# Patient Record
Sex: Female | Born: 1970 | Race: White | Hispanic: No | Marital: Married | Smoking: Former smoker
Health system: Southern US, Community
[De-identification: ages and names within clinical notes are randomized; demographics above are authoritative.]

## PROBLEM LIST (undated history)

## (undated) DIAGNOSIS — F988 Other specified behavioral and emotional disorders with onset usually occurring in childhood and adolescence: Secondary | ICD-10-CM

## (undated) DIAGNOSIS — S129XXA Fracture of neck, unspecified, initial encounter: Secondary | ICD-10-CM

## (undated) DIAGNOSIS — M069 Rheumatoid arthritis, unspecified: Secondary | ICD-10-CM

## (undated) DIAGNOSIS — R51 Headache: Secondary | ICD-10-CM

## (undated) DIAGNOSIS — Z87442 Personal history of urinary calculi: Secondary | ICD-10-CM

## (undated) DIAGNOSIS — G4730 Sleep apnea, unspecified: Secondary | ICD-10-CM

## (undated) DIAGNOSIS — I10 Essential (primary) hypertension: Secondary | ICD-10-CM

## (undated) DIAGNOSIS — F32A Depression, unspecified: Secondary | ICD-10-CM

## (undated) DIAGNOSIS — Z9889 Other specified postprocedural states: Secondary | ICD-10-CM

## (undated) DIAGNOSIS — B009 Herpesviral infection, unspecified: Secondary | ICD-10-CM

## (undated) DIAGNOSIS — F329 Major depressive disorder, single episode, unspecified: Secondary | ICD-10-CM

## (undated) DIAGNOSIS — K5909 Other constipation: Secondary | ICD-10-CM

## (undated) DIAGNOSIS — K219 Gastro-esophageal reflux disease without esophagitis: Secondary | ICD-10-CM

## (undated) DIAGNOSIS — R519 Headache, unspecified: Secondary | ICD-10-CM

## (undated) DIAGNOSIS — M549 Dorsalgia, unspecified: Secondary | ICD-10-CM

## (undated) DIAGNOSIS — T4145XA Adverse effect of unspecified anesthetic, initial encounter: Secondary | ICD-10-CM

## (undated) DIAGNOSIS — T8859XA Other complications of anesthesia, initial encounter: Secondary | ICD-10-CM

## (undated) DIAGNOSIS — E0590 Thyrotoxicosis, unspecified without thyrotoxic crisis or storm: Secondary | ICD-10-CM

## (undated) DIAGNOSIS — F419 Anxiety disorder, unspecified: Secondary | ICD-10-CM

## (undated) DIAGNOSIS — N63 Unspecified lump in unspecified breast: Secondary | ICD-10-CM

## (undated) DIAGNOSIS — G8929 Other chronic pain: Secondary | ICD-10-CM

## (undated) DIAGNOSIS — R112 Nausea with vomiting, unspecified: Secondary | ICD-10-CM

## (undated) DIAGNOSIS — G709 Myoneural disorder, unspecified: Secondary | ICD-10-CM

## (undated) DIAGNOSIS — I456 Pre-excitation syndrome: Secondary | ICD-10-CM

## (undated) DIAGNOSIS — E039 Hypothyroidism, unspecified: Secondary | ICD-10-CM

## (undated) DIAGNOSIS — M797 Fibromyalgia: Secondary | ICD-10-CM

## (undated) HISTORY — DX: Pre-excitation syndrome: I45.6

## (undated) HISTORY — PX: SIGMOIDOSCOPY: SUR1295

## (undated) HISTORY — PX: THYROIDECTOMY: SHX17

## (undated) HISTORY — PX: TONSILLECTOMY: SUR1361

## (undated) HISTORY — PX: LUMBAR SPINE SURGERY: SHX701

## (undated) HISTORY — DX: Anxiety disorder, unspecified: F41.9

## (undated) HISTORY — DX: Depression, unspecified: F32.A

## (undated) HISTORY — PX: BUNIONECTOMY: SHX129

## (undated) HISTORY — PX: WISDOM TOOTH EXTRACTION: SHX21

## (undated) HISTORY — DX: Rheumatoid arthritis, unspecified: M06.9

## (undated) HISTORY — DX: Major depressive disorder, single episode, unspecified: F32.9

## (undated) HISTORY — PX: CARDIAC ELECTROPHYSIOLOGY MAPPING AND ABLATION: SHX1292

## (undated) HISTORY — DX: Dorsalgia, unspecified: M54.9

## (undated) HISTORY — PX: BACK SURGERY: SHX140

## (undated) HISTORY — DX: Other chronic pain: G89.29

## (undated) HISTORY — PX: ABDOMINAL HYSTERECTOMY: SHX81

## (undated) HISTORY — PX: CERVICAL SPINE SURGERY: SHX589

---

## 1992-06-29 DIAGNOSIS — I456 Pre-excitation syndrome: Secondary | ICD-10-CM

## 1992-06-29 HISTORY — DX: Pre-excitation syndrome: I45.6

## 1994-06-29 DIAGNOSIS — Z9889 Other specified postprocedural states: Secondary | ICD-10-CM

## 1994-06-29 HISTORY — DX: Other specified postprocedural states: Z98.890

## 1994-06-29 HISTORY — PX: THYROIDECTOMY: SHX17

## 2000-08-02 ENCOUNTER — Emergency Department (HOSPITAL_COMMUNITY): Admission: EM | Admit: 2000-08-02 | Discharge: 2000-08-02 | Payer: Self-pay | Admitting: Emergency Medicine

## 2000-08-24 ENCOUNTER — Other Ambulatory Visit: Admission: RE | Admit: 2000-08-24 | Discharge: 2000-08-24 | Payer: Self-pay | Admitting: Gynecology

## 2001-08-31 ENCOUNTER — Other Ambulatory Visit: Admission: RE | Admit: 2001-08-31 | Discharge: 2001-08-31 | Payer: Self-pay | Admitting: Gynecology

## 2003-04-23 ENCOUNTER — Other Ambulatory Visit: Admission: RE | Admit: 2003-04-23 | Discharge: 2003-04-23 | Payer: Self-pay | Admitting: Gynecology

## 2004-07-21 ENCOUNTER — Other Ambulatory Visit: Admission: RE | Admit: 2004-07-21 | Discharge: 2004-07-21 | Payer: Self-pay | Admitting: Gynecology

## 2004-12-18 ENCOUNTER — Ambulatory Visit: Payer: Self-pay | Admitting: Cardiology

## 2005-02-05 ENCOUNTER — Encounter: Admission: RE | Admit: 2005-02-05 | Discharge: 2005-05-06 | Payer: Self-pay | Admitting: Family Medicine

## 2005-05-19 ENCOUNTER — Encounter: Admission: RE | Admit: 2005-05-19 | Discharge: 2005-06-17 | Payer: Self-pay | Admitting: Family Medicine

## 2005-06-26 ENCOUNTER — Encounter: Admission: RE | Admit: 2005-06-26 | Discharge: 2005-06-26 | Payer: Self-pay | Admitting: Neurological Surgery

## 2005-06-29 ENCOUNTER — Emergency Department (HOSPITAL_COMMUNITY): Admission: EM | Admit: 2005-06-29 | Discharge: 2005-06-29 | Payer: Self-pay | Admitting: Emergency Medicine

## 2005-06-30 ENCOUNTER — Encounter: Admission: RE | Admit: 2005-06-30 | Discharge: 2005-06-30 | Payer: Self-pay | Admitting: Neurological Surgery

## 2005-07-02 ENCOUNTER — Ambulatory Visit (HOSPITAL_COMMUNITY): Admission: RE | Admit: 2005-07-02 | Discharge: 2005-07-03 | Payer: Self-pay | Admitting: Neurological Surgery

## 2005-07-09 ENCOUNTER — Ambulatory Visit (HOSPITAL_COMMUNITY): Admission: RE | Admit: 2005-07-09 | Discharge: 2005-07-09 | Payer: Self-pay | Admitting: Unknown Physician Specialty

## 2005-10-23 ENCOUNTER — Ambulatory Visit: Payer: Self-pay | Admitting: Cardiology

## 2005-12-04 ENCOUNTER — Encounter: Admission: RE | Admit: 2005-12-04 | Discharge: 2006-01-15 | Payer: Self-pay | Admitting: Family Medicine

## 2006-02-03 ENCOUNTER — Ambulatory Visit (HOSPITAL_COMMUNITY): Admission: RE | Admit: 2006-02-03 | Discharge: 2006-02-04 | Payer: Self-pay | Admitting: Neurological Surgery

## 2006-03-09 ENCOUNTER — Encounter: Admission: RE | Admit: 2006-03-09 | Discharge: 2006-03-09 | Payer: Self-pay | Admitting: Neurological Surgery

## 2006-05-17 ENCOUNTER — Encounter: Admission: RE | Admit: 2006-05-17 | Discharge: 2006-05-17 | Payer: Self-pay | Admitting: Neurological Surgery

## 2006-09-13 ENCOUNTER — Other Ambulatory Visit: Admission: RE | Admit: 2006-09-13 | Discharge: 2006-09-13 | Payer: Self-pay | Admitting: Gynecology

## 2009-12-26 ENCOUNTER — Encounter: Admission: RE | Admit: 2009-12-26 | Discharge: 2009-12-26 | Payer: Self-pay | Admitting: Rheumatology

## 2010-08-07 ENCOUNTER — Emergency Department (HOSPITAL_COMMUNITY)
Admission: EM | Admit: 2010-08-07 | Discharge: 2010-08-07 | Disposition: A | Discharge status: Home / Self Care | Payer: Managed Care, Other (non HMO) | Attending: Emergency Medicine | Admitting: Emergency Medicine

## 2010-08-07 DIAGNOSIS — X58XXXA Exposure to other specified factors, initial encounter: Secondary | ICD-10-CM | POA: Insufficient documentation

## 2010-08-07 DIAGNOSIS — R220 Localized swelling, mass and lump, head: Secondary | ICD-10-CM | POA: Insufficient documentation

## 2010-08-07 DIAGNOSIS — J3489 Other specified disorders of nose and nasal sinuses: Secondary | ICD-10-CM | POA: Insufficient documentation

## 2010-08-07 DIAGNOSIS — R221 Localized swelling, mass and lump, neck: Secondary | ICD-10-CM | POA: Insufficient documentation

## 2010-08-07 DIAGNOSIS — T7840XA Allergy, unspecified, initial encounter: Secondary | ICD-10-CM | POA: Insufficient documentation

## 2010-11-04 ENCOUNTER — Other Ambulatory Visit: Payer: Self-pay | Admitting: Anesthesiology

## 2010-11-04 DIAGNOSIS — M5137 Other intervertebral disc degeneration, lumbosacral region: Secondary | ICD-10-CM

## 2010-11-04 DIAGNOSIS — M545 Low back pain, unspecified: Secondary | ICD-10-CM

## 2010-11-04 DIAGNOSIS — R209 Unspecified disturbances of skin sensation: Secondary | ICD-10-CM

## 2010-11-06 ENCOUNTER — Other Ambulatory Visit: Payer: Managed Care, Other (non HMO)

## 2010-11-08 ENCOUNTER — Other Ambulatory Visit: Payer: Managed Care, Other (non HMO)

## 2010-11-14 NOTE — Op Note (Signed)
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   PROCEDURE:  1. Decompressive anterior cervical diskectomy C5-6.  2. Anterior cervical arthrodesis C5-C6 utilizing a 7-mm allograft.  3. Anterior cervical plating C5-6 utilizing a 25-mm Venture plate.   SURGEON:  Dr. Marikay Alar.   ASSISTANT:  Dr. Donalee Citrin.   ANESTHESIA:  General endotracheal.   COMPLICATIONS:  None apparent.   INDICATIONS FOR PROCEDURE:  [name] is a 40 year old white female who  presented with neck and left arm pain in a C6 distribution.  She had MRI  which showed cervical disk herniation at C5-6 paracentral to the left.  She  tried medical management for quite some time without significant relief.  We  talked about treatment options.  She opted for an anterior cervical  diskectomy and fusion with plating.  She understood the risks, benefits,  expected outcome and wished to proceed.   DESCRIPTION OF PROCEDURE:  The patient was taken to operating room.  After  induction of adequate general endotracheal anesthesia,  she was placed in  the supine position on the operating room table.  Her right anterior  cervical region was prepped with DuraPrep and then draped in the usual  sterile fashion.  Three mL of local anesthesia was injected and a small  incision was made to the right of midline and carried down to the platysma  which was elevated, opened and undermined with Metzenbaum scissors.  I then  dissected a plane medial to the  sternocleidomastoid muscle and internal  carotid artery and lateral to the trachea and esophagus to expose C5-6.  Intraoperative fluoroscopy confirmed my level, and then the longus colli  muscles were taken down bilaterally and the Shadow-Line retractors were  placed under this to expose C5-6.  The annulus was incised and the initial  diskectomy was done with pituitary rongeurs and curved curettes.  I then  used the high-speed drill to drill the endplates down to the level of the  posterior longitudinal ligament.  The posterior longitudinal ligament was  opened with a nerve hook using microscopic dissection and then removed in a  circumferential fashion utilizing the 2-mm Kerrison punch.  We dissected on  the left out to the pedicle, found the pedicle and followed this superiorly  to identify the left C6 nerve root and followed it out into the foramen  until it was adequately decompressed.  We palpated with a nerve hook to  assure adequate decompression.  We undercut the body of C5 and C6 to  decompress the central canal and perform a right-sided foraminotomy also.  We then palpated to assure adequate decompression.  The dura was full and  capacious.  We felt we had a good decompression of left C6 nerve root which  was the goal of surgery.  He irrigated with saline solution containing  bacitracin, dried all bleeding points with Gelfoam and with bipolar cautery,  measured the interspace to be 7 mm and placed a 7-mm allograft into the  interspace at C5-6.  We then used a 25-mm Venture plate and placed two 13-mm  variable-angle screws in the bodies of C5 and C6 and locked these into  position by locking the locking mechanism on the plate.  We then irrigated  with saline solution containing bacitracin, dried all bleeding points with  Surgifoam and with bipolar cautery, inspected our construct once again  utilizing fluoroscopy, and once meticulous hemostasis was achieved, closed  the platysma  with 3-0 Vicryl.  We closed the subcuticular tissue with 3-0  Vicryl and closed the skin with Benzoin and Steri-Strips.  The drapes were  removed.  Sterile dressing was applied.  The patient was awakened from  general anesthesia and transported to the recovery room in stable condition.  At the end of the procedure all sponge, needle and instrument counts were  correct.      Tia Alert, MD  Electronically Signed     DSJ/MEDQ  D:  02/03/2006  T:  02/03/2006  Job:  [id number]

## 2010-11-14 NOTE — Op Note (Signed)
NAME:  [name], [name]               ACCOUNT NO.:  [id number]   MEDICAL RECORD NO.:  [id number]          PATIENT TYPE:  OIB   LOCATION:  3040                         FACILITY:  [facility]   PHYSICIAN:  Tia Alert, MD     DATE OF BIRTH:  [birth year]   DATE OF PROCEDURE:  07/02/2005  DATE OF DISCHARGE:                                 OPERATIVE REPORT   PREOPERATIVE DIAGNOSIS:  Right L4-5 disk herniation with right L5  radiculopathy.   POSTOPERATIVE DIAGNOSIS:  Right L4-5 disk herniation with right L5  radiculopathy.   PROCEDURES:  1.  Decompressive lumbar hemilaminectomy.  2.  Medial facetectomy.  3.  Foraminotomy, L4-5 on the right -- followed by microdiskectomy at L4-5      on the right utilizing microscopic dissection.   SURGEON:  Tia Alert, MD.   ASSISTANT:  Reinaldo Meeker, M.D.   ANESTHESIA:  General endotracheal.   COMPLICATIONS:  None apparent.   INDICATIONS FOR PROCEDURE:  Ms. [name] is a 40 year old white female who is  referred with back and right leg pain.  She had an MRI which showed only  small disk bulges at L4-5 and L5-S1.  I got a CT myelogram which showed a  much larger disk herniation at L4-5, and she had worse symptoms since the  time the MRI was done.  Her pain was consistent with a right L5  radiculopathy. I recommended a lumbar hemilaminectomy and microdiskectomy at  L4-5 on the right side. She understood the risks, benefits and expected  outcome and wished to proceed.   DESCRIPTION OF PROCEDURE:  The patient was taken to the operating room, and  after induction of adequate generalized endotracheal anesthesia she was  rolled onto the prone position on the Wilson frame. All pressure points were  padded. Her lumbar region was prepped with DuraPrep and then draped in the  usual sterile fashion. Then 5 cc of local anesthesia was injected and a  small dorsal midline incision was made and carried down to the lumbosacral  fascia. The fascia was opened at  the paraspinous musculature; was taken down  in a subperiosteal fashion to expose the L4-5 on the right side.  Intraoperative x-ray confirmed my level, and then a hemilaminectomy, medial  facetectomy and a foraminotomy were performed at L4-5 on the right side,  utilizing a combination of the high-speed drill and the Kerrison punch. The  yellow ligament was identified, opened and removed in piecemeal fashion to  expose the underlying dura and L5 nerve root.  I then retracted the nerve  root medially and coagulated the epidural venous vasculature; brought in the  operating microscope and utilizing microscopic dissection, cut the annulus  at the disk and performed a thorough intradiskal diskectomy.  A large  subannular disk herniation was noted, but no free fragments were noted.  I  continued the diskectomy utilizing pituitary rongeurs and curets. Once the  diskectomy was complete, we palpated with a nerve hook into the midline,  distally into the foramen and superiorly to assure adequate decompression.  We found no more significant compression of  the L5 nerve root. Therefore,  the wound was irrigated with copious amounts of bacitracin-containing saline  solution.  All bleeding points were coagulated and the fascia was closed  with interrupted #1 Vicryl. The subcutaneous and subcuticular tissues were  closed with 2-0 and 3-0 Vicryl, and the skin was closed with Benzoin and  Steri-Strips. The drapes were removed. A sterile dressing was applied. The  patient was awakened from general anesthesia and transferred to the recovery  room in stable condition. At the end of the procedure all sponge, needle and  instrument counts were correct.      Tia Alert, MD  Electronically Signed     DSJ/MEDQ  D:  07/02/2005  T:  07/03/2005  Job:  [id number]

## 2010-11-17 ENCOUNTER — Other Ambulatory Visit: Payer: Self-pay | Admitting: Neurological Surgery

## 2010-11-17 ENCOUNTER — Ambulatory Visit
Admission: RE | Admit: 2010-11-17 | Discharge: 2010-11-17 | Disposition: A | Discharge status: Home / Self Care | Payer: Managed Care, Other (non HMO) | Source: Ambulatory Visit | Attending: Neurological Surgery | Admitting: Neurological Surgery

## 2010-11-17 DIAGNOSIS — M542 Cervicalgia: Secondary | ICD-10-CM

## 2010-11-20 ENCOUNTER — Other Ambulatory Visit: Payer: Self-pay | Admitting: Neurological Surgery

## 2010-11-20 DIAGNOSIS — M542 Cervicalgia: Secondary | ICD-10-CM

## 2010-11-22 ENCOUNTER — Ambulatory Visit
Admission: RE | Admit: 2010-11-22 | Discharge: 2010-11-22 | Disposition: A | Discharge status: Home / Self Care | Payer: Managed Care, Other (non HMO) | Source: Ambulatory Visit | Attending: Neurological Surgery | Admitting: Neurological Surgery

## 2010-11-22 DIAGNOSIS — M542 Cervicalgia: Secondary | ICD-10-CM

## 2010-12-03 ENCOUNTER — Other Ambulatory Visit: Payer: No Typology Code available for payment source

## 2010-12-04 ENCOUNTER — Other Ambulatory Visit: Payer: No Typology Code available for payment source

## 2010-12-06 ENCOUNTER — Other Ambulatory Visit: Payer: No Typology Code available for payment source

## 2010-12-09 ENCOUNTER — Ambulatory Visit
Admission: RE | Admit: 2010-12-09 | Discharge: 2010-12-09 | Disposition: A | Discharge status: Home / Self Care | Payer: Managed Care, Other (non HMO) | Source: Ambulatory Visit | Attending: Anesthesiology | Admitting: Anesthesiology

## 2010-12-09 DIAGNOSIS — M545 Low back pain, unspecified: Secondary | ICD-10-CM

## 2010-12-09 DIAGNOSIS — R209 Unspecified disturbances of skin sensation: Secondary | ICD-10-CM

## 2010-12-09 DIAGNOSIS — M5137 Other intervertebral disc degeneration, lumbosacral region: Secondary | ICD-10-CM

## 2010-12-09 DIAGNOSIS — M51379 Other intervertebral disc degeneration, lumbosacral region without mention of lumbar back pain or lower extremity pain: Secondary | ICD-10-CM

## 2011-02-20 ENCOUNTER — Encounter (HOSPITAL_COMMUNITY)
Admission: RE | Admit: 2011-02-20 | Discharge: 2011-02-20 | Disposition: A | Discharge status: Home / Self Care | Payer: Managed Care, Other (non HMO) | Source: Ambulatory Visit | Attending: Neurological Surgery | Admitting: Neurological Surgery

## 2011-02-20 LAB — HCG, SERUM, QUALITATIVE: Preg, Serum: NEGATIVE

## 2011-02-20 LAB — CBC
MCH: 31 pg (ref 26.0–34.0)
MCHC: 35.2 g/dL (ref 30.0–36.0)
Platelets: 258 10*3/uL (ref 150–400)

## 2011-02-20 LAB — BASIC METABOLIC PANEL
BUN: 8 mg/dL (ref 6–23)
CO2: 29 mEq/L (ref 19–32)
Calcium: 10.2 mg/dL (ref 8.4–10.5)
Creatinine, Ser: 0.84 mg/dL (ref 0.50–1.10)
GFR calc Af Amer: >60 mL/min (ref >60)

## 2011-02-20 LAB — DIFFERENTIAL
Basophils Relative: 1 % (ref 0–1)
Eosinophils Absolute: 0.9 10*3/uL — ABNORMAL HIGH (ref 0.0–0.7)
Eosinophils Relative: 9 % — ABNORMAL HIGH (ref 0–5)
Monocytes Absolute: 0.8 10*3/uL (ref 0.1–1.0)
Monocytes Relative: 9 % (ref 3–12)

## 2011-02-20 LAB — PROTIME-INR: Prothrombin Time: 13.8 seconds (ref 11.6–15.2)

## 2011-02-20 LAB — SURGICAL PCR SCREEN: Staphylococcus aureus: NEGATIVE

## 2011-02-24 ENCOUNTER — Other Ambulatory Visit (HOSPITAL_COMMUNITY): Payer: Managed Care, Other (non HMO)

## 2011-02-25 ENCOUNTER — Observation Stay (HOSPITAL_COMMUNITY): Payer: Managed Care, Other (non HMO)

## 2011-02-25 ENCOUNTER — Observation Stay (HOSPITAL_COMMUNITY)
Admission: RE | Admit: 2011-02-25 | Discharge: 2011-02-25 | Disposition: A | Discharge status: Home / Self Care | Payer: Managed Care, Other (non HMO) | Source: Ambulatory Visit | Attending: Neurological Surgery | Admitting: Neurological Surgery

## 2011-02-25 DIAGNOSIS — M5126 Other intervertebral disc displacement, lumbar region: Principal | ICD-10-CM | POA: Insufficient documentation

## 2011-02-25 DIAGNOSIS — F17200 Nicotine dependence, unspecified, uncomplicated: Secondary | ICD-10-CM | POA: Insufficient documentation

## 2011-02-25 DIAGNOSIS — F3289 Other specified depressive episodes: Secondary | ICD-10-CM | POA: Insufficient documentation

## 2011-02-25 DIAGNOSIS — F329 Major depressive disorder, single episode, unspecified: Secondary | ICD-10-CM | POA: Insufficient documentation

## 2011-02-25 DIAGNOSIS — Z01812 Encounter for preprocedural laboratory examination: Secondary | ICD-10-CM | POA: Insufficient documentation

## 2011-03-23 NOTE — Op Note (Signed)
[name], [name]           ACCOUNT NO.:  [id number]  MEDICAL RECORD NO.:  [id number]  LOCATION:  3599                         FACILITY:  [facility]  PHYSICIAN:  Tia Alert, MD     DATE OF BIRTH:  Feb 03, 1971  DATE OF PROCEDURE:  02/25/2011 DATE OF DISCHARGE:                              OPERATIVE REPORT   PREOPERATIVE DIAGNOSIS:  Recurrent right L4-5 lumbar disk herniation with right L5 radiculopathy.  POSTOPERATIVE DIAGNOSIS:  Recurrent right L4-5 lumbar disk herniation with right L5 radiculopathy.  PROCEDURE:  Right L4-5 redo hemilaminectomy, medial facetectomy and foraminotomy with release of epidural adhesions followed by redo microdiskectomy at L4-5 on the right utilizing microscopic dissection.  SURGEON:  Tia Alert, MD  ASSISTANT:  Donalee Citrin, MD  ANESTHESIA:  General endotracheal.  COMPLICATIONS:  None apparent.  INDICATIONS FOR THE PROCEDURE:  [name] is a 40 year old female who presented with recurrent right leg pain in L5 distribution.  She had undergone a previous microdiskectomy at L4-5 on the right in 2007.  She had an MRI which showed a recurrent disk herniation at L4-5 on the right and recommended redo microdiskectomy.  She understood the risks, benefits, and expected outcome and wished to proceed.  DESCRIPTION OF THE PROCEDURE:  The patient was taken to the operating room.  After induction of adequate generalized endotracheal anesthesia, she was rolled into the prone position on the Wilson frame and all pressure points were padded.  Her lumbar region was prepped with DuraPrep and then draped in usual sterile fashion.  A 5 mL of local anesthesia was injected and a dorsal midline incision was made through the old scar and taken down to the lumbosacral fascia.  The fascia was opened on the right side and taken down into subperiosteal fashion to expose L4-5 on the right.  Intraoperative x-ray confirmed my level and then I used the high-speed  drill to widen the hemilaminectomy and medial facetectomy.  I used a Statistician to dissect the scar away from the bony edge and then used a 3-mm Kerrison punch to undercut the bony edge to widen the laminotomy and the medial facetectomy.  I undercut the lateral recess.  I found normal yellow ligament, undercut this until I could see I was lateral to the dura and over the lateral part of the disk space.  I also could feel the pedicle and see the L5 nerve root exiting at the pedicle level.  I brought in the operating microscope.  I was able to use a nerve hook to dissect the dura away from the epidural scar.  We were then able to retract the nerve root slightly medially, incise the disk space and perform a thorough intradiskal diskectomy with pituitary rongeurs.  Once the diskectomy was complete, I irrigated with saline solution and bacitracin, dried out all bleeding points, palpated along the nerve root once again to assure adequate decompression and felt like we had a good redo diskectomy.  We lined the dura with Gelfoam, removed the retractor, checked for any bleeding, then closed the fascia with 0 Vicryl closing subcutaneous and subcuticular tissue with 2-0 and 3-0 Vicryl and closed the skin with Benzoin and Steri-Strips.  The drapes  were removed and sterile dressing was applied. The patient awakened from general anesthesia and transferred to recovery room in stable condition.  At the end of the procedure, all sponge, needle, and instrument counts were correct.     Tia Alert, MD     DSJ/MEDQ  D:  02/25/2011  T:  02/25/2011  Job:  [id number]  Electronically Signed by Marikay Alar MD on 03/23/2011 01:13:08 PM

## 2011-03-31 ENCOUNTER — Other Ambulatory Visit: Payer: Self-pay | Admitting: Neurological Surgery

## 2011-03-31 DIAGNOSIS — M545 Low back pain, unspecified: Secondary | ICD-10-CM

## 2011-04-05 ENCOUNTER — Ambulatory Visit
Admission: RE | Admit: 2011-04-05 | Discharge: 2011-04-05 | Disposition: A | Discharge status: Home / Self Care | Payer: Managed Care, Other (non HMO) | Source: Ambulatory Visit | Attending: Neurological Surgery | Admitting: Neurological Surgery

## 2011-04-05 DIAGNOSIS — M545 Low back pain, unspecified: Secondary | ICD-10-CM

## 2011-04-05 MED ORDER — GADOBENATE DIMEGLUMINE 529 MG/ML IV SOLN
18.0000 mL | Freq: Once | INTRAVENOUS | Status: AC | PRN
  Administered 2011-04-05: 18 mL via INTRAVENOUS

## 2011-05-04 ENCOUNTER — Other Ambulatory Visit: Payer: Self-pay | Admitting: Neurological Surgery

## 2011-05-04 DIAGNOSIS — M545 Low back pain, unspecified: Secondary | ICD-10-CM

## 2011-05-11 ENCOUNTER — Inpatient Hospital Stay: Admission: RE | Admit: 2011-05-11 | Payer: Managed Care, Other (non HMO) | Source: Ambulatory Visit

## 2011-05-12 ENCOUNTER — Inpatient Hospital Stay: Admission: RE | Admit: 2011-05-12 | Payer: Managed Care, Other (non HMO) | Source: Ambulatory Visit

## 2011-05-16 ENCOUNTER — Ambulatory Visit
Admission: RE | Admit: 2011-05-16 | Discharge: 2011-05-16 | Disposition: A | Discharge status: Home / Self Care | Payer: Managed Care, Other (non HMO) | Source: Ambulatory Visit | Attending: Neurological Surgery | Admitting: Neurological Surgery

## 2011-05-16 DIAGNOSIS — M545 Low back pain, unspecified: Secondary | ICD-10-CM

## 2011-05-16 MED ORDER — GADOBENATE DIMEGLUMINE 529 MG/ML IV SOLN: 17.0000 mL | Freq: Once | INTRAVENOUS | Status: AC | PRN

## 2013-01-13 ENCOUNTER — Other Ambulatory Visit (HOSPITAL_COMMUNITY): Payer: Self-pay | Admitting: Obstetrics and Gynecology

## 2013-01-13 DIAGNOSIS — IMO0002 Reserved for concepts with insufficient information to code with codable children: Secondary | ICD-10-CM

## 2013-01-27 ENCOUNTER — Ambulatory Visit (HOSPITAL_COMMUNITY): Payer: Managed Care, Other (non HMO)

## 2013-01-30 ENCOUNTER — Ambulatory Visit (HOSPITAL_COMMUNITY)
Admission: RE | Admit: 2013-01-30 | Discharge: 2013-01-30 | Disposition: A | Discharge status: Home / Self Care | Payer: Managed Care, Other (non HMO) | Source: Ambulatory Visit | Attending: Obstetrics and Gynecology | Admitting: Obstetrics and Gynecology

## 2013-01-30 DIAGNOSIS — N979 Female infertility, unspecified: Secondary | ICD-10-CM | POA: Insufficient documentation

## 2013-01-30 DIAGNOSIS — IMO0002 Reserved for concepts with insufficient information to code with codable children: Secondary | ICD-10-CM

## 2013-01-30 MED ORDER — IOHEXOL 300 MG/ML  SOLN
10.0000 mL | Freq: Once | INTRAMUSCULAR | Status: AC | PRN
  Administered 2013-01-30: 10 mL

## 2013-02-28 ENCOUNTER — Encounter: Payer: Self-pay | Admitting: Internal Medicine

## 2013-04-03 ENCOUNTER — Ambulatory Visit (INDEPENDENT_AMBULATORY_CARE_PROVIDER_SITE_OTHER): Payer: Managed Care, Other (non HMO) | Admitting: Internal Medicine

## 2013-04-03 ENCOUNTER — Encounter: Payer: Self-pay | Admitting: Internal Medicine

## 2013-04-03 VITALS — BP 100/62 | HR 88 | Ht 67.0 in | Wt 207.4 lb

## 2013-04-03 DIAGNOSIS — K5900 Constipation, unspecified: Secondary | ICD-10-CM

## 2013-04-03 DIAGNOSIS — K219 Gastro-esophageal reflux disease without esophagitis: Secondary | ICD-10-CM

## 2013-04-03 DIAGNOSIS — R198 Other specified symptoms and signs involving the digestive system and abdomen: Secondary | ICD-10-CM

## 2013-04-03 DIAGNOSIS — R194 Change in bowel habit: Secondary | ICD-10-CM

## 2013-04-03 DIAGNOSIS — K5909 Other constipation: Secondary | ICD-10-CM

## 2013-04-03 DIAGNOSIS — IMO0001 Reserved for inherently not codable concepts without codable children: Secondary | ICD-10-CM

## 2013-04-03 MED ORDER — NA SULFATE-K SULFATE-MG SULF 17.5-3.13-1.6 GM/177ML PO SOLN: ORAL | Status: DC

## 2013-04-03 MED ORDER — LINACLOTIDE 145 MCG PO CAPS: 145.0000 ug | ORAL_CAPSULE | Freq: Every day | ORAL | Status: DC

## 2013-04-03 NOTE — Progress Notes (Signed)
Subjective:    Patient ID: [name], female    DOB: 08-31-1970, 42 y.o.   MRN: [id number]  HPI The patient is a very nice married middle-aged woman here because of constipation problems as well as what she described as vomiting. She reports a long history of chronic constipation but has been worse in the last month or so. She is only moving her bowels a couple of times a week if that. She has very small bowel movements that are more narrower pencil thin. There is not associated bleeding. She did happen have a more normal bowel movement today she says. She saw her primary care physician and a GoLYTELY prep was taken as she said all she had worked liquid stools and she didn't really feel much better. She also has gagging and regurgitation in the mornings for the last year with reflux of fluid and some vomiting-like symptoms. She does not have any dysphagia. She has been evaluated previously in [place] and was told she was either gluten sensitive or intolerant. She went gluten-free and lost weight but she is not gluten-free now. I cannot tell if she really had celiac testing, she did not have an EGD. She had a sigmoidoscopy in the past in [place] and was told she had a very angulated colon. She has tried MiraLax, citrate of magnesia and other laxatives without benefit. She tells me her Synthroid dose is was increased 3 months ago.  She is a retired Actuary, she has had multiple back surgeries. She is on chronic pain medication with narcotics. Allergies  Allergen Reactions  . Darvocet [Propoxyphene-Acetaminophen]    Current outpatient prescriptions:ABILIFY 10 MG tablet, , Disp: , Rfl: ;  clonazePAM (KLONOPIN) 2 MG tablet, , Disp: , Rfl: ;  DULoxetine (CYMBALTA) 60 MG capsule, Take 60 mg by mouth daily., Disp: , Rfl: ;  levothyroxine (SYNTHROID, LEVOTHROID) 175 MCG tablet, , Disp: , Rfl: ;  meloxicam (MOBIC) 15 MG tablet, , Disp: , Rfl: ;  oxyCODONE (ROXICODONE) 15 MG immediate release  tablet, , Disp: , Rfl: ;  tiZANidine (ZANAFLEX) 4 MG tablet, , Disp: , Rfl:  VIIBRYD 40 MG TABS, , Disp: , Rfl: ;   Past Medical History  Diagnosis Date  . Rheumatoid arthritis   . Anxiety   . Depression   . Wolff-Parkinson-White (WPW) syndrome   . Chronic back pain    Past Surgical History  Procedure Laterality Date  . Thyroidectomy    . Tonsillectomy    . Cardiac electrophysiology mapping and ablation      WPW  . Cervical spine surgery      fusions x2  . Lumbar spine surgery      x 3  . Sigmoidoscopy     History   Social History  . Marital Status: Married    Spouse Name: N/A    Number of Children: N/A  . Years of Education: N/A   Social History Main Topics  . Smoking status: Former Games developer  . Smokeless tobacco: Former Neurosurgeon     Comment: Systems developer  . Alcohol Use: No  . Drug Use: No  . Sexual Activity: None   Other Topics Concern  . None   Social History Narrative   Retired and disabled Environmental manager for [employer]   Married no children   Family History  Problem Relation Age of Onset  . Colon polyps Mother     Review of Systems As per history of present illness a positive for allergies,  anxiety and depressive symptoms, back pain, menstrual pains and muscle cramps insomnia and excessive thirst. All other review of systems are negative.    Objective:   Physical Exam General:  Well-developed, well-nourished and in no acute distress Eyes:  anicteric. ENT:   Mouth and posterior pharynx free of lesions.  Neck:   supple w/o thyromegaly or mass.  Lungs: Clear to auscultation bilaterally. Heart:  S1S2, no rubs, murmurs, gallops. Abdomen:  soft, mildly tender, no hepatosplenomegaly, hernia, or mass and BS+.  Rectal:  Female staff present Anoderm inspection revealed normal Anal wink was absent Digital exam revealed normal resting tone and voluntary squeeze. No mass or rectocele present. Broon formed stool, no impaction. Simulated defecation with valsalva revealed  appropriate abdominal contraction and sphincter contraction.    Lymph:  no cervical or supraclavicular adenopathy. Extremities:   no edema Skin   no rash. Neuro:  A&O x 3.  Psych:  appropriate mood and  Affect.   Data Reviewed:  I requested the last years labs and her last office visit with her primary care physician.      Assessment & Plan:   1. Change in bowel habits   1. 2. Chronic constipation probably slow transit was some question of pelvic dyssynergia and narcotic contribution  3. Reflux    1. She will be scheduled for EGD and colonoscopy to investigate these problems. The risks and benefits as well as alternatives of endoscopic procedure(s) have been discussed and reviewed. All questions answered. The patient agrees to proceed. I want to tested for celiac disease, with TTG antibody and IgA level but I neglected order these at the visit today though I could take duodenal biopsies when she returns. I will discuss with her then. I think it's unlikely she truly has celiac disease. A trial of linaclotide 145 mcg daily was provided as well.  CC:  Duane Lope, MD

## 2013-04-03 NOTE — Patient Instructions (Addendum)
You have been scheduled for an endoscopy and colonoscopy with propofol. Please follow the written instructions given to you at your visit today. Please use the suprep kit we have given you today. If you use inhalers (even only as needed), please bring them with you on the day of your procedure. Your physician has requested that you go to www.startemmi.com and enter the access code given to you at your visit today. This web site gives a general overview about your procedure. However, you should still follow specific instructions given to you by our office regarding your preparation for the procedure.  Today you have been given samples of linzess to try for constipation.  Take one daily on an empty stomach before breakfast.  We will obtain your records from Dr. Tenny Craw for Dr. Leone Payor to review.    I appreciate the opportunity to care for you.

## 2013-04-04 ENCOUNTER — Telehealth: Payer: Self-pay | Admitting: Internal Medicine

## 2013-04-04 NOTE — Telephone Encounter (Signed)
Pt wanted to know if it was ok to take Percocet day of procedure. Explained that it is ok to take am of procedure.

## 2013-04-06 ENCOUNTER — Encounter: Payer: Self-pay | Admitting: Internal Medicine

## 2013-04-06 ENCOUNTER — Ambulatory Visit (AMBULATORY_SURGERY_CENTER): Payer: Managed Care, Other (non HMO) | Admitting: Internal Medicine

## 2013-04-06 VITALS — BP 110/62 | HR 74 | Temp 100.0°F | Resp 24 | Ht 67.0 in | Wt 207.0 lb

## 2013-04-06 DIAGNOSIS — K219 Gastro-esophageal reflux disease without esophagitis: Secondary | ICD-10-CM

## 2013-04-06 DIAGNOSIS — D1330 Benign neoplasm of unspecified part of small intestine: Secondary | ICD-10-CM

## 2013-04-06 DIAGNOSIS — R198 Other specified symptoms and signs involving the digestive system and abdomen: Secondary | ICD-10-CM

## 2013-04-06 HISTORY — PX: COLONOSCOPY: SHX174

## 2013-04-06 HISTORY — PX: ESOPHAGOGASTRODUODENOSCOPY: SHX1529

## 2013-04-06 MED ORDER — SODIUM CHLORIDE 0.9 % IV SOLN: 500.0000 mL | INTRAVENOUS | Status: DC

## 2013-04-06 NOTE — Progress Notes (Signed)
Called to room to assist during endoscopic procedure.  Patient ID and intended procedure confirmed with present staff. Received instructions for my participation in the procedure from the performing physician.  

## 2013-04-06 NOTE — Progress Notes (Signed)
Lidocaine-40mg IV prior to Propofol InductionPropofol given over incremental dosages 

## 2013-04-06 NOTE — Patient Instructions (Addendum)
The esophagus, stomach and duodenum looked normal. I took biopsies of the duodenum to check for celiac (gluten) disease and will let you know. The colonoscopy was normal.  Continue taking the Linzess and let me know if that is helping or we can ask when we call about biopsies next week. Hopefully this will improve the constipation and that in turn may relieve the regurgitation and reflux.  Next colonoscopy (routine) 10 years - 2024.  I appreciate the opportunity to care for you. Iva Boop, MD, FACG    YOU HAD AN ENDOSCOPIC PROCEDURE TODAY AT THE [facility]: Refer to the procedure report that was given to you for any specific questions about what was found during the examination.  If the procedure report does not answer your questions, please call your gastroenterologist to clarify.  If you requested that your care partner not be given the details of your procedure findings, then the procedure report has been included in a sealed envelope for you to review at your convenience later.  YOU SHOULD EXPECT: Some feelings of bloating in the abdomen. Passage of more gas than usual.  Walking can help get rid of the air that was put into your GI tract during the procedure and reduce the bloating. If you had a lower endoscopy (such as a colonoscopy or flexible sigmoidoscopy) you may notice spotting of blood in your stool or on the toilet paper. If you underwent a bowel prep for your procedure, then you may not have a normal bowel movement for a few days.  DIET: Your first meal following the procedure should be a light meal and then it is ok to progress to your normal diet.  A half-sandwich or bowl of soup is an example of a good first meal.  Heavy or fried foods are harder to digest and may make you feel nauseous or bloated.  Likewise meals heavy in dairy and vegetables can cause extra gas to form and this can also increase the bloating.  Drink plenty of fluids but you should avoid alcoholic  beverages for 24 hours.  ACTIVITY: Your care partner should take you home directly after the procedure.  You should plan to take it easy, moving slowly for the rest of the day.  You can resume normal activity the day after the procedure however you should NOT DRIVE or use heavy machinery for 24 hours (because of the sedation medicines used during the test).    SYMPTOMS TO REPORT IMMEDIATELY: A gastroenterologist can be reached at any hour.  During normal business hours, 8:30 AM to 5:00 PM Monday through Friday, call [phone].  After hours and on weekends, please call the GI answering service at [phone] who will take a message and have the physician on call contact you.   Following lower endoscopy (colonoscopy or flexible sigmoidoscopy):  Excessive amounts of blood in the stool  Significant tenderness or worsening of abdominal pains  Swelling of the abdomen that is new, acute  Fever of 100F or higher  Following upper endoscopy (EGD)  Vomiting of blood or coffee ground material  New chest pain or pain under the shoulder blades  Painful or persistently difficult swallowing  New shortness of breath  Fever of 100F or higher  Black, tarry-looking stools  FOLLOW UP: If any biopsies were taken you will be contacted by phone or by letter within the next 1-3 weeks.  Call your gastroenterologist if you have not heard about the biopsies in 3 weeks.  Our staff will call the home number listed on your records the next business day following your procedure to check on you and address any questions or concerns that you may have at that time regarding the information given to you following your procedure. This is a courtesy call and so if there is no answer at the home number and we have not heard from you through the emergency physician on call, we will assume that you have returned to your regular daily activities without incident.  SIGNATURES/CONFIDENTIALITY: You and/or your care partner  have signed paperwork which will be entered into your electronic medical record.  These signatures attest to the fact that that the information above on your After Visit Summary has been reviewed and is understood.  Full responsibility of the confidentiality of this discharge information lies with you and/or your care-partner.   You may resume your current medications today. Please call if any questions or concerns.

## 2013-04-06 NOTE — Progress Notes (Signed)
No complaints noted in the recovery room. maw 

## 2013-04-06 NOTE — Op Note (Signed)
[facility] [address] [place], [address]   COLONOSCOPY PROCEDURE REPORT  PATIENT: [name], [name]  MR#: [id number] BIRTHDATE: 1971/06/20 , 42  yrs. old GENDER: Female ENDOSCOPIST: Iva Boop, MD, [facility] PROCEDURE DATE:  04/06/2013 PROCEDURE:   Colonoscopy, diagnostic First Screening Colonoscopy - Avg.  risk and is 50 yrs.  old or older - No.  Prior Negative Screening - Now for repeat screening. N/A  History of Adenoma - Now for follow-up colonoscopy & has been > or = to 3 yrs.  N/A  Polyps Removed Today? No.  Recommend repeat exam, <10 yrs? No. ASA CLASS:   Class II INDICATIONS:Change in bowel habits. MEDICATIONS: There was residual sedation effect present from prior procedure, propofol (Diprivan) 200mg  IV, MAC sedation, administered by CRNA, and These medications were titrated to patient response per physician's verbal order  DESCRIPTION OF PROCEDURE:   After the risks benefits and alternatives of the procedure were thoroughly explained, informed consent was obtained.  A digital rectal exam revealed no abnormalities of the rectum.   The LB [id number]  endoscope was introduced through the anus and advanced to the cecum, which was identified by both the appendix and ileocecal valve. No adverse events experienced.   The quality of the prep was Suprep good  The instrument was then slowly withdrawn as the colon was fully examined.      COLON FINDINGS: A normal appearing cecum, ileocecal valve, and appendiceal orifice were identified.  The ascending, hepatic flexure, transverse, splenic flexure, descending, sigmoid colon and rectum appeared unremarkable.  No polyps or cancers were seen.   A right colon retroflexion was performed.  Retroflexed views revealed no abnormalities. The time to cecum=4 minutes 10 seconds. Withdrawal time=8 minutes 10 seconds.  The scope was withdrawn and the procedure completed. COMPLICATIONS: There were no  complications.  ENDOSCOPIC IMPRESSION: Normal colonoscopy - good prep  RECOMMENDATIONS: 1.  Repeat colonoscopy 10 years. 2.   Continue Linzess Will arrange f/u after duodenal bxs reviewed.   eSigned:  Iva Boop, MD, [facility] 04/06/2013 2:21 PM   cc: Gildardo Cranker MD and The Patient

## 2013-04-06 NOTE — Op Note (Signed)
[facility] [address] [place], [address]   ENDOSCOPY PROCEDURE REPORT  PATIENT: Taylor, Jennings  MR#: [id number] BIRTHDATE: 06/19/1971 , 42  yrs. old GENDER: Female ENDOSCOPIST: Iva Boop, MD, [facility] PROCEDURE DATE:  04/06/2013 PROCEDURE:  EGD w/ biopsy ASA CLASS:     Class II INDICATIONS:  Heartburn.   History of esophageal reflux. MEDICATIONS: propofol (Diprivan) 200mg  IV, MAC sedation, administered by CRNA, and These medications were titrated to patient response per physician's verbal order TOPICAL ANESTHETIC: Cetacaine Spray  DESCRIPTION OF PROCEDURE: After the risks benefits and alternatives of the procedure were thoroughly explained, informed consent was obtained.  The LB [id number] endoscope was introduced through the mouth and advanced to the second portion of the duodenum. Without limitations.  The instrument was slowly withdrawn as the mucosa was fully examined.      The upper, middle and distal third of the esophagus were carefully inspected and no abnormalities were noted.  The z-line was well seen at the GEJ.  The endoscope was pushed into the fundus which was normal including a retroflexed view.  The antrum, gastric body, first and second part of the duodenum were unremarkable.  Multiple random biopsies of the duodenum were performed.  Retroflexed views revealed no abnormalities.     The scope was then withdrawn from the patient and the procedure completed.  COMPLICATIONS: There were no complications. ENDOSCOPIC IMPRESSION: Normal EGD; multiple random biopsies of duodenum to evaluate for celiac disease  RECOMMENDATIONS: 1.  Proceed with a Colonoscopy. 2.  Await pathology results   eSigned:  Iva Boop, MD, [facility] 04/06/2013 2:18 PM   FA:OZHY, Leonette Most MD and The Patient

## 2013-04-07 ENCOUNTER — Telehealth: Payer: Self-pay | Admitting: *Deleted

## 2013-04-07 NOTE — Telephone Encounter (Signed)
  Follow up Call-  Call back number 04/06/2013  Post procedure Call Back phone  # [phone]  Permission to leave phone message Yes     Patient questions:  Left message to call us if necessary.

## 2013-04-14 ENCOUNTER — Encounter: Payer: Self-pay | Admitting: *Deleted

## 2013-04-14 ENCOUNTER — Telehealth: Payer: Self-pay | Admitting: Internal Medicine

## 2013-04-14 ENCOUNTER — Other Ambulatory Visit: Payer: Self-pay | Admitting: *Deleted

## 2013-04-14 MED ORDER — LINACLOTIDE 290 MCG PO CAPS: 290.0000 ug | ORAL_CAPSULE | Freq: Every day | ORAL | Status: DC

## 2013-04-14 NOTE — Telephone Encounter (Signed)
See results note. 

## 2013-06-14 ENCOUNTER — Ambulatory Visit: Payer: Managed Care, Other (non HMO) | Admitting: Internal Medicine

## 2013-09-21 DIAGNOSIS — H832X9 Labyrinthine dysfunction, unspecified ear: Secondary | ICD-10-CM | POA: Insufficient documentation

## 2013-09-22 ENCOUNTER — Other Ambulatory Visit: Payer: Self-pay | Admitting: Family Medicine

## 2013-09-22 DIAGNOSIS — H832X9 Labyrinthine dysfunction, unspecified ear: Secondary | ICD-10-CM

## 2013-09-30 ENCOUNTER — Ambulatory Visit
Admission: RE | Admit: 2013-09-30 | Discharge: 2013-09-30 | Disposition: A | Discharge status: Home / Self Care | Payer: Managed Care, Other (non HMO) | Source: Ambulatory Visit | Attending: Family Medicine | Admitting: Family Medicine

## 2013-09-30 DIAGNOSIS — H832X9 Labyrinthine dysfunction, unspecified ear: Secondary | ICD-10-CM

## 2013-09-30 MED ORDER — GADOBENATE DIMEGLUMINE 529 MG/ML IV SOLN
18.0000 mL | Freq: Once | INTRAVENOUS | Status: AC | PRN
  Administered 2013-09-30: 18 mL via INTRAVENOUS

## 2014-02-27 DIAGNOSIS — N63 Unspecified lump in unspecified breast: Secondary | ICD-10-CM

## 2014-02-27 HISTORY — DX: Unspecified lump in unspecified breast: N63.0

## 2014-03-22 ENCOUNTER — Other Ambulatory Visit: Payer: Self-pay | Admitting: Obstetrics and Gynecology

## 2014-03-22 DIAGNOSIS — R928 Other abnormal and inconclusive findings on diagnostic imaging of breast: Secondary | ICD-10-CM

## 2014-03-29 ENCOUNTER — Ambulatory Visit
Admission: RE | Admit: 2014-03-29 | Discharge: 2014-03-29 | Disposition: A | Discharge status: Home / Self Care | Payer: Managed Care, Other (non HMO) | Source: Ambulatory Visit | Attending: Obstetrics and Gynecology | Admitting: Obstetrics and Gynecology

## 2014-03-29 DIAGNOSIS — R928 Other abnormal and inconclusive findings on diagnostic imaging of breast: Secondary | ICD-10-CM

## 2014-04-26 ENCOUNTER — Emergency Department (HOSPITAL_COMMUNITY)
Admission: EM | Admit: 2014-04-26 | Discharge: 2014-04-26 | Disposition: A | Discharge status: Home / Self Care | Payer: Managed Care, Other (non HMO) | Attending: Emergency Medicine | Admitting: Emergency Medicine

## 2014-04-26 ENCOUNTER — Emergency Department (HOSPITAL_COMMUNITY): Payer: Managed Care, Other (non HMO)

## 2014-04-26 ENCOUNTER — Encounter (HOSPITAL_COMMUNITY): Payer: Self-pay | Admitting: Emergency Medicine

## 2014-04-26 DIAGNOSIS — F329 Major depressive disorder, single episode, unspecified: Secondary | ICD-10-CM | POA: Insufficient documentation

## 2014-04-26 DIAGNOSIS — N8320 Unspecified ovarian cysts: Secondary | ICD-10-CM | POA: Insufficient documentation

## 2014-04-26 DIAGNOSIS — Z8679 Personal history of other diseases of the circulatory system: Secondary | ICD-10-CM | POA: Insufficient documentation

## 2014-04-26 DIAGNOSIS — Z720 Tobacco use: Secondary | ICD-10-CM | POA: Diagnosis not present

## 2014-04-26 DIAGNOSIS — Z7951 Long term (current) use of inhaled steroids: Secondary | ICD-10-CM | POA: Insufficient documentation

## 2014-04-26 DIAGNOSIS — R109 Unspecified abdominal pain: Secondary | ICD-10-CM | POA: Diagnosis present

## 2014-04-26 DIAGNOSIS — G8929 Other chronic pain: Secondary | ICD-10-CM | POA: Insufficient documentation

## 2014-04-26 DIAGNOSIS — F419 Anxiety disorder, unspecified: Secondary | ICD-10-CM | POA: Insufficient documentation

## 2014-04-26 DIAGNOSIS — N83209 Unspecified ovarian cyst, unspecified side: Secondary | ICD-10-CM

## 2014-04-26 DIAGNOSIS — Z79899 Other long term (current) drug therapy: Secondary | ICD-10-CM | POA: Insufficient documentation

## 2014-04-26 DIAGNOSIS — Z9889 Other specified postprocedural states: Secondary | ICD-10-CM | POA: Diagnosis not present

## 2014-04-26 DIAGNOSIS — Z3202 Encounter for pregnancy test, result negative: Secondary | ICD-10-CM | POA: Insufficient documentation

## 2014-04-26 DIAGNOSIS — Z791 Long term (current) use of non-steroidal anti-inflammatories (NSAID): Secondary | ICD-10-CM | POA: Insufficient documentation

## 2014-04-26 DIAGNOSIS — Z8719 Personal history of other diseases of the digestive system: Secondary | ICD-10-CM | POA: Diagnosis not present

## 2014-04-26 DIAGNOSIS — Z8739 Personal history of other diseases of the musculoskeletal system and connective tissue: Secondary | ICD-10-CM | POA: Insufficient documentation

## 2014-04-26 HISTORY — DX: Other constipation: K59.09

## 2014-04-26 HISTORY — DX: Gastro-esophageal reflux disease without esophagitis: K21.9

## 2014-04-26 HISTORY — DX: Unspecified lump in unspecified breast: N63.0

## 2014-04-26 LAB — PREGNANCY, URINE: Preg Test, Ur: NEGATIVE

## 2014-04-26 LAB — COMPREHENSIVE METABOLIC PANEL
ALT: 60 U/L — ABNORMAL HIGH (ref 0–35)
AST: 31 U/L (ref 0–37)
Albumin: 3.9 g/dL (ref 3.5–5.2)
Alkaline Phosphatase: 69 U/L (ref 39–117)
Anion gap: 11 (ref 5–15)
BUN: 10 mg/dL (ref 6–23)
CALCIUM: 9.2 mg/dL (ref 8.4–10.5)
CO2: 24 mEq/L (ref 19–32)
Chloride: 102 mEq/L (ref 96–112)
Creatinine, Ser: 0.74 mg/dL (ref 0.50–1.10)
GLUCOSE: 106 mg/dL — AB (ref 70–99)
Potassium: 4.2 mEq/L (ref 3.7–5.3)
Sodium: 137 mEq/L (ref 137–147)
TOTAL PROTEIN: 7.4 g/dL (ref 6.0–8.3)
Total Bilirubin: 0.4 mg/dL (ref 0.3–1.2)

## 2014-04-26 LAB — I-STAT TROPONIN, ED: TROPONIN I, POC: 0.01 ng/mL (ref 0.00–0.08)

## 2014-04-26 LAB — URINALYSIS, ROUTINE W REFLEX MICROSCOPIC
BILIRUBIN URINE: NEGATIVE
Glucose, UA: NEGATIVE mg/dL
HGB URINE DIPSTICK: NEGATIVE
KETONES UR: NEGATIVE mg/dL
Leukocytes, UA: NEGATIVE
Nitrite: NEGATIVE
PROTEIN: NEGATIVE mg/dL
Specific Gravity, Urine: 1.008 (ref 1.005–1.030)
Urobilinogen, UA: 0.2 mg/dL (ref 0.0–1.0)
pH: 6.5 (ref 5.0–8.0)

## 2014-04-26 LAB — LIPASE, BLOOD: LIPASE: 21 U/L (ref 11–59)

## 2014-04-26 LAB — CBC WITH DIFFERENTIAL/PLATELET
Basophils Absolute: 0 10*3/uL (ref 0.0–0.1)
Basophils Relative: 0 % (ref 0–1)
Eosinophils Absolute: 0.1 10*3/uL (ref 0.0–0.7)
Eosinophils Relative: 1 % (ref 0–5)
HEMATOCRIT: 43.5 % (ref 36.0–46.0)
Hemoglobin: 14.8 g/dL (ref 12.0–15.0)
LYMPHS ABS: 1.9 10*3/uL (ref 0.7–4.0)
Lymphocytes Relative: 16 % (ref 12–46)
MCH: 29.7 pg (ref 26.0–34.0)
MCHC: 34 g/dL (ref 30.0–36.0)
MCV: 87.2 fL (ref 78.0–100.0)
Monocytes Absolute: 0.9 10*3/uL (ref 0.1–1.0)
Monocytes Relative: 8 % (ref 3–12)
NEUTROS ABS: 9.2 10*3/uL — AB (ref 1.7–7.7)
NEUTROS PCT: 75 % (ref 43–77)
Platelets: 189 10*3/uL (ref 150–400)
RBC: 4.99 MIL/uL (ref 3.87–5.11)
RDW: 12.8 % (ref 11.5–15.5)
WBC: 12.1 10*3/uL — ABNORMAL HIGH (ref 4.0–10.5)

## 2014-04-26 MED ORDER — MORPHINE SULFATE 4 MG/ML IJ SOLN
4.0000 mg | INTRAMUSCULAR | Status: AC | PRN
  Administered 2014-04-26 (×2): 4 mg via INTRAVENOUS
  Filled 2014-04-26 (×2): qty 1

## 2014-04-26 MED ORDER — IOHEXOL 300 MG/ML  SOLN
100.0000 mL | Freq: Once | INTRAMUSCULAR | Status: AC | PRN
Start: 2014-04-26 — End: 2014-04-26
  Administered 2014-04-26: 100 mL via INTRAVENOUS

## 2014-04-26 MED ORDER — ONDANSETRON HCL 4 MG/2ML IJ SOLN
4.0000 mg | INTRAMUSCULAR | Status: DC | PRN
  Administered 2014-04-26: 4 mg via INTRAVENOUS
  Filled 2014-04-26: qty 2

## 2014-04-26 MED ORDER — MORPHINE SULFATE 4 MG/ML IJ SOLN
4.0000 mg | INTRAMUSCULAR | Status: DC | PRN
  Administered 2014-04-26: 4 mg via INTRAVENOUS
  Filled 2014-04-26: qty 1

## 2014-04-26 MED ORDER — SODIUM CHLORIDE 0.9 % IV SOLN
INTRAVENOUS | Status: DC
  Administered 2014-04-26: 14:00:00 via INTRAVENOUS

## 2014-04-26 MED ORDER — ONDANSETRON HCL 4 MG/2ML IJ SOLN
4.0000 mg | INTRAMUSCULAR | Status: AC | PRN
  Administered 2014-04-26 (×2): 4 mg via INTRAVENOUS
  Filled 2014-04-26 (×2): qty 2

## 2014-04-26 MED ORDER — IOHEXOL 300 MG/ML  SOLN
50.0000 mL | Freq: Once | INTRAMUSCULAR | Status: AC | PRN
Start: 2014-04-26 — End: 2014-04-26
  Administered 2014-04-26: 50 mL via ORAL

## 2014-04-26 NOTE — ED Notes (Signed)
Pt c/o pain that radiates from mid sternum down her stomach and across her abd from left to right that started yesterday.  Pt denies n/v/d.

## 2014-04-26 NOTE — Discharge Instructions (Signed)
Abdominal Pain, Women °Abdominal (stomach, pelvic, or belly) pain can be caused by many things. It is important to tell your doctor: °· The location of the pain. °· Does it come and go or is it present all the time? °· Are there things that start the pain (eating certain foods, exercise)? °· Are there other symptoms associated with the pain (fever, nausea, vomiting, diarrhea)? °All of this is helpful to know when trying to find the cause of the pain. °CAUSES  °· Stomach: virus or bacteria infection, or ulcer. °· Intestine: appendicitis (inflamed appendix), regional ileitis (Crohn's disease), ulcerative colitis (inflamed colon), irritable bowel syndrome, diverticulitis (inflamed diverticulum of the colon), or cancer of the stomach or intestine. °· Gallbladder disease or stones in the gallbladder. °· Kidney disease, kidney stones, or infection. °· Pancreas infection or cancer. °· Fibromyalgia (pain disorder). °· Diseases of the female organs: °¨ Uterus: fibroid (non-cancerous) tumors or infection. °¨ Fallopian tubes: infection or tubal pregnancy. °¨ Ovary: cysts or tumors. °¨ Pelvic adhesions (scar tissue). °¨ Endometriosis (uterus lining tissue growing in the pelvis and on the pelvic organs). °¨ Pelvic congestion syndrome (female organs filling up with blood just before the menstrual period). °¨ Pain with the menstrual period. °¨ Pain with ovulation (producing an egg). °¨ Pain with an IUD (intrauterine device, birth control) in the uterus. °¨ Cancer of the female organs. °· Functional pain (pain not caused by a disease, may improve without treatment). °· Psychological pain. °· Depression. °DIAGNOSIS  °Your doctor will decide the seriousness of your pain by doing an examination. °· Blood tests. °· X-rays. °· Ultrasound. °· CT scan (computed tomography, special type of X-ray). °· MRI (magnetic resonance imaging). °· Cultures, for infection. °· Barium enema (dye inserted in the large intestine, to better view it with  X-rays). °· Colonoscopy (looking in intestine with a lighted tube). °· Laparoscopy (minor surgery, looking in abdomen with a lighted tube). °· Major abdominal exploratory surgery (looking in abdomen with a large incision). °TREATMENT  °The treatment will depend on the cause of the pain.  °· Many cases can be observed and treated at home. °· Over-the-counter medicines recommended by your caregiver. °· Prescription medicine. °· Antibiotics, for infection. °· Birth control pills, for painful periods or for ovulation pain. °· Hormone treatment, for endometriosis. °· Nerve blocking injections. °· Physical therapy. °· Antidepressants. °· Counseling with a psychologist or psychiatrist. °· Minor or major surgery. °HOME CARE INSTRUCTIONS  °· Do not take laxatives, unless directed by your caregiver. °· Take over-the-counter pain medicine only if ordered by your caregiver. Do not take aspirin because it can cause an upset stomach or bleeding. °· Try a clear liquid diet (broth or water) as ordered by your caregiver. Slowly move to a bland diet, as tolerated, if the pain is related to the stomach or intestine. °· Have a thermometer and take your temperature several times a day, and record it. °· Bed rest and sleep, if it helps the pain. °· Avoid sexual intercourse, if it causes pain. °· Avoid stressful situations. °· Keep your follow-up appointments and tests, as your caregiver orders. °· If the pain does not go away with medicine or surgery, you may try: °¨ Acupuncture. °¨ Relaxation exercises (yoga, meditation). °¨ Group therapy. °¨ Counseling. °SEEK MEDICAL CARE IF:  °· You notice certain foods cause stomach pain. °· Your home care treatment is not helping your pain. °· You need stronger pain medicine. °· You want your IUD removed. °· You feel faint or   lightheaded. °· You develop nausea and vomiting. °· You develop a rash. °· You are having side effects or an allergy to your medicine. °SEEK IMMEDIATE MEDICAL CARE IF:  °· Your  pain does not go away or gets worse. °· You have a fever. °· Your pain is felt only in portions of the abdomen. The right side could possibly be appendicitis. The left lower portion of the abdomen could be colitis or diverticulitis. °· You are passing blood in your stools (bright red or black tarry stools, with or without vomiting). °· You have blood in your urine. °· You develop chills, with or without a fever. °· You pass out. °MAKE SURE YOU:  °· Understand these instructions. °· Will watch your condition. °· Will get help right away if you are not doing well or get worse. °Document Released: 04/12/2007 Document Revised: 10/30/2013 Document Reviewed: 05/02/2009 °ExitCare® Patient Information ©2015 [employer]. This information is not intended to replace advice given to you by your health care provider. Make sure you discuss any questions you have with your health care provider. ° °

## 2014-04-26 NOTE — ED Notes (Signed)
Pt denies n/v and diarrhea.

## 2014-04-26 NOTE — ED Provider Notes (Signed)
CSN: [id number]     Arrival date & time 04/26/14  1201 History   First MD Initiated Contact with Patient 04/26/14 1230     Chief Complaint  Patient presents with  . Abdominal Pain     HPI Pt was seen at 1240.  Per pt, c/o gradual onset and persistence of constant generalized abd "pain" since yesterday. Describes the abd pain as "aching" and "sharp."  Denies N/V, no diarrhea, no fevers, no back pain, no rash, no CP/SOB, no black or blood in stools, no dysuria/hematuria, no vaginal bleeding/discharge.       Past Medical History  Diagnosis Date  . Rheumatoid arthritis   . Anxiety   . Depression   . Wolff-Parkinson-White (WPW) syndrome   . Chronic back pain   . GERD (gastroesophageal reflux disease)   . Chronic constipation   . Breast mass 02/2014   Past Surgical History  Procedure Laterality Date  . Thyroidectomy    . Tonsillectomy    . Cardiac electrophysiology mapping and ablation      WPW  . Cervical spine surgery      fusions x2  . Lumbar spine surgery      x 3  . Sigmoidoscopy    . Colonoscopy  04/06/2013  . Esophagogastroduodenoscopy  04/06/2013   Family History  Problem Relation Age of Onset  . Colon polyps Mother    History  Substance Use Topics  . Smoking status: Current Every Day Smoker    Types: Cigarettes  . Smokeless tobacco: Former Systems developer     Comment: Primary school teacher  . Alcohol Use: No    Review of Systems ROS: Statement: All systems negative except as marked or noted in the HPI; Constitutional: Negative for fever and chills. ; ; Eyes: Negative for eye pain, redness and discharge. ; ; ENMT: Negative for ear pain, hoarseness, nasal congestion, sinus pressure and sore throat. ; ; Cardiovascular: Negative for chest pain, palpitations, diaphoresis, dyspnea and peripheral edema. ; ; Respiratory: Negative for cough, wheezing and stridor. ; ; Gastrointestinal: +abd pain. Negative for nausea, vomiting, diarrhea, blood in stool, hematemesis, jaundice and rectal  bleeding. . ; ; Genitourinary: Negative for dysuria, flank pain and hematuria. ; ; Musculoskeletal: Negative for back pain and neck pain. Negative for swelling and trauma.; ; Skin: Negative for pruritus, rash, abrasions, blisters, bruising and skin lesion.; ; Neuro: Negative for headache, lightheadedness and neck stiffness. Negative for weakness, altered level of consciousness , altered mental status, extremity weakness, paresthesias, involuntary movement, seizure and syncope.      Allergies  Dilaudid and Darvocet  Home Medications   Prior to Admission medications   Medication Sig Start Date End Date Taking? Authorizing Provider  ARIPiprazole (ABILIFY) 5 MG tablet Take 5 mg by mouth daily.   Yes Historical Provider, MD  B Complex Vitamins (B COMPLEX PO) Take 1 capsule by mouth daily.   Yes Historical Provider, MD  buPROPion (WELLBUTRIN XL) 150 MG 24 hr tablet Take 150 mg by mouth daily.  04/04/13  Yes Historical Provider, MD  cetirizine (ZYRTEC) 10 MG tablet Take 10 mg by mouth at bedtime.   Yes Historical Provider, MD  Cholecalciferol (VITAMIN D-3) 1000 UNITS CAPS Take 2,000 Units by mouth daily.   Yes Historical Provider, MD  clonazePAM (KLONOPIN) 2 MG tablet Take 2 mg by mouth 4 (four) times daily as needed for anxiety.  03/03/13  Yes Historical Provider, MD  DULoxetine (CYMBALTA) 60 MG capsule Take 60 mg by mouth daily.   Yes  Historical Provider, MD  fexofenadine (ALLEGRA) 180 MG tablet Take 180 mg by mouth daily.   Yes Historical Provider, MD  fluticasone (FLONASE) 50 MCG/ACT nasal spray Place 1 spray into both nostrils daily.   Yes Historical Provider, MD  levothyroxine (SYNTHROID, LEVOTHROID) 175 MCG tablet Take 175 mcg by mouth daily before breakfast.  02/02/13  Yes Historical Provider, MD  meloxicam (MOBIC) 15 MG tablet Take 15 mg by mouth daily.  03/07/13  Yes Historical Provider, MD  oxycodone (ROXICODONE) 30 MG immediate release tablet Take 30 mg by mouth every 4 (four) hours as needed for  pain.   Yes Historical Provider, MD  tiZANidine (ZANAFLEX) 4 MG tablet Take 4 mg by mouth daily as needed for muscle spasms.  03/24/13  Yes Historical Provider, MD  VIIBRYD 40 MG TABS Take 40 mg by mouth daily.  01/11/13  Yes Historical Provider, MD   BP 102/63  Pulse 75  Temp(Src) 97.9 F (36.6 C) (Oral)  Resp 24  SpO2 98%  LMP 04/08/2014 Physical Exam 1245: Physical examination:  Nursing notes reviewed; Vital signs and O2 SAT reviewed;  Constitutional: Well developed, Well nourished, Well hydrated, Uncomfortable appearing.; Head:  Normocephalic, atraumatic; Eyes: EOMI, PERRL, No scleral icterus; ENMT: Mouth and pharynx normal, Mucous membranes moist; Neck: Supple, Full range of motion, No lymphadenopathy; Cardiovascular: Regular rate and rhythm, No murmur, rub, or gallop; Respiratory: Breath sounds clear & equal bilaterally, No rales, rhonchi, wheezes.  Speaking full sentences with ease, Normal respiratory effort/excursion; Chest: Nontender, Movement normal; Abdomen: Soft, +diffuse tenderness to palp. No rebound or guarding. Nondistended, Normal bowel sounds; Genitourinary: No CVA tenderness; Extremities: Pulses normal, No tenderness, No edema, No calf edema or asymmetry.; Neuro: AA&Ox3, Major CN grossly intact.  Speech clear. No gross focal motor or sensory deficits in extremities. Climbs on and off stretcher easily by herself. Gait steady.; Skin: Color normal, Warm, Dry.   ED Course  Procedures     EKG Interpretation   Date/Time:  Thursday April 26 2014 12:56:01 EDT Ventricular Rate:  74 PR Interval:  123 QRS Duration: 102 QT Interval:  406 QTC Calculation: 450 R Axis:   108 Text Interpretation:  Sinus rhythm Right axis deviation When compared with  ECG of 07/01/2005 No significant change was found Confirmed by [facility]  MD,  Nunzio Cory [phone]) on 04/26/2014 2:51:19 PM      MDM  MDM Reviewed: previous chart, vitals and nursing note Reviewed previous: labs and  ECG Interpretation: labs, CT scan and ECG   Results for orders placed during the hospital encounter of 04/26/14  COMPREHENSIVE METABOLIC PANEL      Result Value Ref Range   Sodium 137  137 - 147 mEq/L   Potassium 4.2  3.7 - 5.3 mEq/L   Chloride 102  96 - 112 mEq/L   CO2 24  19 - 32 mEq/L   Glucose, Bld 106 (*) 70 - 99 mg/dL   BUN 10  6 - 23 mg/dL   Creatinine, Ser 0.74  0.50 - 1.10 mg/dL   Calcium 9.2  8.4 - 10.5 mg/dL   Total Protein 7.4  6.0 - 8.3 g/dL   Albumin 3.9  3.5 - 5.2 g/dL   AST 31  0 - 37 U/L   ALT 60 (*) 0 - 35 U/L   Alkaline Phosphatase 69  39 - 117 U/L   Total Bilirubin 0.4  0.3 - 1.2 mg/dL   GFR calc non Af Amer >90  >90 mL/min   GFR calc Af Amer >  90  >90 mL/min   Anion gap 11  5 - 15  LIPASE, BLOOD      Result Value Ref Range   Lipase 21  11 - 59 U/L  URINALYSIS, ROUTINE W REFLEX MICROSCOPIC      Result Value Ref Range   Color, Urine YELLOW  YELLOW   APPearance CLEAR  CLEAR   Specific Gravity, Urine 1.008  1.005 - 1.030   pH 6.5  5.0 - 8.0   Glucose, UA NEGATIVE  NEGATIVE mg/dL   Hgb urine dipstick NEGATIVE  NEGATIVE   Bilirubin Urine NEGATIVE  NEGATIVE   Ketones, ur NEGATIVE  NEGATIVE mg/dL   Protein, ur NEGATIVE  NEGATIVE mg/dL   Urobilinogen, UA 0.2  0.0 - 1.0 mg/dL   Nitrite NEGATIVE  NEGATIVE   Leukocytes, UA NEGATIVE  NEGATIVE  PREGNANCY, URINE      Result Value Ref Range   Preg Test, Ur NEGATIVE  NEGATIVE  CBC WITH DIFFERENTIAL      Result Value Ref Range   WBC 12.1 (*) 4.0 - 10.5 K/uL   RBC 4.99  3.87 - 5.11 MIL/uL   Hemoglobin 14.8  12.0 - 15.0 g/dL   HCT 43.5  36.0 - 46.0 %   MCV 87.2  78.0 - 100.0 fL   MCH 29.7  26.0 - 34.0 pg   MCHC 34.0  30.0 - 36.0 g/dL   RDW 12.8  11.5 - 15.5 %   Platelets 189  150 - 400 K/uL   Neutrophils Relative % 75  43 - 77 %   Neutro Abs 9.2 (*) 1.7 - 7.7 K/uL   Lymphocytes Relative 16  12 - 46 %   Lymphs Abs 1.9  0.7 - 4.0 K/uL   Monocytes Relative 8  3 - 12 %   Monocytes Absolute 0.9  0.1 - 1.0 K/uL    Eosinophils Relative 1  0 - 5 %   Eosinophils Absolute 0.1  0.0 - 0.7 K/uL   Basophils Relative 0  0 - 1 %   Basophils Absolute 0.0  0.0 - 0.1 K/uL  I-STAT TROPOININ, ED      Result Value Ref Range   Troponin i, poc 0.01  0.00 - 0.08 ng/mL   Comment 3            Ct Abdomen Pelvis W Contrast 04/26/2014   CLINICAL DATA:  One day history abdominal pain  EXAM: CT ABDOMEN AND PELVIS WITH CONTRAST  TECHNIQUE: Multidetector CT imaging of the abdomen and pelvis was performed using the standard protocol following bolus administration of intravenous contrast. Oral contrast was also administered.  CONTRAST:  122mL OMNIPAQUE IOHEXOL 300 MG/ML  SOLN  COMPARISON:  None.  FINDINGS: There is mild bibasilar atelectatic change. Lung bases are otherwise clear.  There is a mass, incompletely visualized, in the right breast measuring 4 x 3 cm.  No focal liver lesions are identified. Gallbladder wall is not thickened. There is no biliary duct dilatation.  Spleen, pancreas, and adrenals appear normal. There is a 1.3 x 1.3 cm cyst in the medial upper pole left kidney. No other renal mass is seen. There is no hydronephrosis. There is intravenous contrast present in the collecting systems which may obscure small calculi. No obvious renal or ureteral calculi are apparent on this study.  In the pelvis, the urinary bladder is midline with normal wall thickness. Urinary bladder is somewhat distended. Both ovaries are rather prominent with several probable dominant follicles. There is no free pelvic fluid.  Beyond the ovaries, there are no pelvic masses. The appendix is not appreciable. There is no periappendiceal region inflammation.  There is no bowel obstruction.  No free air or portal venous air.  There is no appreciable ascites, adenopathy, or abscess in the abdomen or pelvis. There is no demonstrable abdominal aortic aneurysm. There is degenerative type change with disc narrowing at L4-5 and L5-S1. There are no blastic or lytic  bone lesions.  IMPRESSION: There is a mass in the right breast measuring 4 x 3 cm. This finding warrants correlation with mammography and ultrasound to further assess.  Both ovaries are prominent with what appear to be dominant follicles. Given the prominence of the ovaries bilaterally, correlation with pelvic ultrasound advised.  There is no demonstrable hydronephrosis on either side. There is no bowel obstruction. No free air. No mesenteric inflammation or abscess. Urinary bladder is midline with normal wall thickness. Urinary bladder is noted to be somewhat distended at this time.   Electronically Signed   By: Lowella Grip M.D.   On: 04/26/2014 15:38   Mm Digital Diagnostic Unilat R 03/29/2014   CLINICAL DATA:  The patient returns after screening study for evaluation of the right breast.  EXAM: DIGITAL DIAGNOSTIC  RIGHT MAMMOGRAM  ULTRASOUND RIGHT BREAST  COMPARISON:  03/16/2014 and earlier  ACR Breast Density Category c: The breast tissue is heterogeneously dense, which may obscure small masses.  FINDINGS: Spot compression views are performed, showing no persistent distortion in the upper central portion of the right breast. Circumscribed masses are identified in the retroareolar region consistent with cysts.  On physical exam, I palpate a rounded mobile mass in the is retroareolar region of the right breast.  Ultrasound is performed, showing numerous simple cysts in the retroareolar region of the right breast. The largest is 3.9 cm. Just above the cysts, there is dense fibroglandular tissue, likely accounting for the mammographic appearance. No suspicious mass or acoustic shadowing identified.  IMPRESSION: 1. Numerous benign retroareolar cysts. 2.  No mammographic or ultrasound evidence for malignancy.  RECOMMENDATION: Screening mammogram in one year.(Code:SM-B-01Y)  I have discussed the findings and recommendations with the patient. Results were also provided in writing at the conclusion of the visit.  If applicable, a reminder letter will be sent to the patient regarding the next appointment.  BI-RADS CATEGORY  2: Benign.   Electronically Signed   By: Shon Hale M.D.   On: 03/29/2014 11:40   US Breast Ltd Uni Right Inc Axilla 03/29/2014   CLINICAL DATA:  The patient returns after screening study for evaluation of the right breast.  EXAM: DIGITAL DIAGNOSTIC  RIGHT MAMMOGRAM  ULTRASOUND RIGHT BREAST  COMPARISON:  03/16/2014 and earlier  ACR Breast Density Category c: The breast tissue is heterogeneously dense, which may obscure small masses.  FINDINGS: Spot compression views are performed, showing no persistent distortion in the upper central portion of the right breast. Circumscribed masses are identified in the retroareolar region consistent with cysts.  On physical exam, I palpate a rounded mobile mass in the is retroareolar region of the right breast.  Ultrasound is performed, showing numerous simple cysts in the retroareolar region of the right breast. The largest is 3.9 cm. Just above the cysts, there is dense fibroglandular tissue, likely accounting for the mammographic appearance. No suspicious mass or acoustic shadowing identified.  IMPRESSION: 1. Numerous benign retroareolar cysts. 2.  No mammographic or ultrasound evidence for malignancy.  RECOMMENDATION: Screening mammogram in one year.(Code:SM-B-01Y)  I have discussed the  findings and recommendations with the patient. Results were also provided in writing at the conclusion of the visit. If applicable, a reminder letter will be sent to the patient regarding the next appointment.  BI-RADS CATEGORY  2: Benign.   Electronically Signed   By: Shon Hale M.D.   On: 03/29/2014 11:40     1625:  Pt has already had mammogram for her right breast mass (as above). US pelvis ordered to f/u CT scan findings. Workup otherwise reassuring. Dispo based on results. Sign out to Dr. Doy Mince.     Francine Graven, DO 04/26/14 [phone]

## 2014-04-26 NOTE — ED Provider Notes (Signed)
6:32 PM care assumed from Dr. Thurnell Garbe.  Patient's ultrasound demonstrated bilateral complex ovarian cysts. Patient states that she has had this diagnosis in the past. Radiology report questions whether this could potentially represent tubo-ovarian abscesses. I think this is unlikely given patient's well appearance, lack of pain currently, no fevers, and history complex ovarian cyst. I think she is stable for discharge. I Did give her return precautions. I think she likely has gastritis, so I recommended she start a course of omeprazole and follow-up with her primary doctor.    Impression: 1. Abdominal pain   2. Ovarian cyst       Artis Delay, MD 04/26/14 [phone]

## 2014-08-06 ENCOUNTER — Other Ambulatory Visit: Payer: Self-pay | Admitting: Nurse Practitioner

## 2014-08-06 DIAGNOSIS — M542 Cervicalgia: Secondary | ICD-10-CM

## 2014-09-17 ENCOUNTER — Ambulatory Visit
Admission: RE | Admit: 2014-09-17 | Discharge: 2014-09-17 | Disposition: A | Discharge status: Home / Self Care | Payer: BLUE CROSS/BLUE SHIELD | Source: Ambulatory Visit | Attending: Nurse Practitioner | Admitting: Nurse Practitioner

## 2014-09-17 DIAGNOSIS — M542 Cervicalgia: Secondary | ICD-10-CM

## 2014-09-17 MED ORDER — GADOBENATE DIMEGLUMINE 529 MG/ML IV SOLN
17.0000 mL | Freq: Once | INTRAVENOUS | Status: AC | PRN
  Administered 2014-09-17: 17 mL via INTRAVENOUS

## 2014-11-19 ENCOUNTER — Telehealth: Payer: Self-pay | Admitting: Internal Medicine

## 2014-11-19 NOTE — Telephone Encounter (Signed)
I called and explained on voicemail that there was a scheduling error and that the appt is actually for Friday 11/23/14 at 2:15 and not for tomorrow.  She is asked to call and ask to speak with Barb Merino, RN if she has any questions.  I did apologize for the error.

## 2014-11-19 NOTE — Telephone Encounter (Signed)
Patient reports that she has 4 days of LLQ pain that is getting worse.  She reports that at her colonoscopy Dr. Carlean Purl told her she had a very sharp turn in her colon on the left.  She feels that this is where her pain and that she has a bowel obstruction. She did have a BM yesterday, but reports that they are thin.   She is grunting on the phone when I was speaking to her.  She reports that her pain is as high as an 8 or 9, but right now a 6.  She is given an appt for tomorrow at 2:15 with Arta Bruce, PA

## 2014-11-23 ENCOUNTER — Ambulatory Visit: Payer: BLUE CROSS/BLUE SHIELD | Admitting: Physician Assistant

## 2014-11-27 ENCOUNTER — Ambulatory Visit: Payer: BLUE CROSS/BLUE SHIELD | Admitting: Physician Assistant

## 2014-12-13 ENCOUNTER — Inpatient Hospital Stay: Admission: RE | Admit: 2014-12-13 | Payer: BLUE CROSS/BLUE SHIELD | Source: Ambulatory Visit

## 2014-12-13 ENCOUNTER — Ambulatory Visit (INDEPENDENT_AMBULATORY_CARE_PROVIDER_SITE_OTHER): Payer: BLUE CROSS/BLUE SHIELD | Admitting: Physician Assistant

## 2014-12-13 ENCOUNTER — Encounter (INDEPENDENT_AMBULATORY_CARE_PROVIDER_SITE_OTHER): Payer: Self-pay

## 2014-12-13 ENCOUNTER — Encounter: Payer: Self-pay | Admitting: Physician Assistant

## 2014-12-13 ENCOUNTER — Other Ambulatory Visit (INDEPENDENT_AMBULATORY_CARE_PROVIDER_SITE_OTHER): Payer: BLUE CROSS/BLUE SHIELD

## 2014-12-13 ENCOUNTER — Telehealth: Payer: Self-pay | Admitting: *Deleted

## 2014-12-13 VITALS — BP 108/70 | HR 92 | Ht 67.0 in | Wt 203.0 lb

## 2014-12-13 DIAGNOSIS — G8929 Other chronic pain: Secondary | ICD-10-CM

## 2014-12-13 DIAGNOSIS — I456 Pre-excitation syndrome: Secondary | ICD-10-CM | POA: Diagnosis not present

## 2014-12-13 DIAGNOSIS — M549 Dorsalgia, unspecified: Secondary | ICD-10-CM

## 2014-12-13 DIAGNOSIS — R1031 Right lower quadrant pain: Secondary | ICD-10-CM

## 2014-12-13 DIAGNOSIS — K5900 Constipation, unspecified: Secondary | ICD-10-CM

## 2014-12-13 DIAGNOSIS — K5909 Other constipation: Secondary | ICD-10-CM

## 2014-12-13 DIAGNOSIS — R1012 Left upper quadrant pain: Secondary | ICD-10-CM

## 2014-12-13 LAB — CBC WITH DIFFERENTIAL/PLATELET
Basophils Absolute: 0 10*3/uL (ref 0.0–0.1)
Basophils Relative: 0.4 % (ref 0.0–3.0)
EOS ABS: 0.2 10*3/uL (ref 0.0–0.7)
Eosinophils Relative: 2 % (ref 0.0–5.0)
HCT: 46.3 % — ABNORMAL HIGH (ref 36.0–46.0)
Hemoglobin: 15.5 g/dL — ABNORMAL HIGH (ref 12.0–15.0)
LYMPHS PCT: 29.3 % (ref 12.0–46.0)
Lymphs Abs: 2.4 10*3/uL (ref 0.7–4.0)
MCHC: 33.5 g/dL (ref 30.0–36.0)
MCV: 87.1 fl (ref 78.0–100.0)
MONOS PCT: 10.7 % (ref 3.0–12.0)
Monocytes Absolute: 0.9 10*3/uL (ref 0.1–1.0)
Neutro Abs: 4.7 10*3/uL (ref 1.4–7.7)
Neutrophils Relative %: 57.6 % (ref 43.0–77.0)
Platelets: 250 10*3/uL (ref 150.0–400.0)
RBC: 5.31 Mil/uL — AB (ref 3.87–5.11)
RDW: 13.6 % (ref 11.5–15.5)
WBC: 8.2 10*3/uL (ref 4.0–10.5)

## 2014-12-13 LAB — BASIC METABOLIC PANEL
BUN: 7 mg/dL (ref 6–23)
CALCIUM: 9.4 mg/dL (ref 8.4–10.5)
CO2: 30 mEq/L (ref 19–32)
Chloride: 100 mEq/L (ref 96–112)
Creatinine, Ser: 0.77 mg/dL (ref 0.40–1.20)
GFR: 85.78 mL/min (ref >60.00)
Glucose, Bld: 87 mg/dL (ref 70–99)
POTASSIUM: 4 meq/L (ref 3.5–5.1)
SODIUM: 135 meq/L (ref 135–145)

## 2014-12-13 LAB — HIGH SENSITIVITY CRP: CRP, High Sensitivity: 3.4 mg/L (ref 0.000–5.000)

## 2014-12-13 MED ORDER — LUBIPROSTONE 8 MCG PO CAPS
8.0000 ug | ORAL_CAPSULE | Freq: Two times a day (BID) | ORAL | Status: DC
Start: 2014-12-13 — End: 2019-04-05

## 2014-12-13 NOTE — Telephone Encounter (Signed)
Called to advise the patient that I got her appt changed to mon 12-17-2014 at 11 Am. She is to arrive at 10:45 am . She is to have no food  past 7:00 am  She is to drink the 1st bottle of contrast at 9:00 am  And 2nd bottle at 10:00 am . Patient verbalized understanding the instructions.

## 2014-12-13 NOTE — Progress Notes (Signed)
Patient ID: [name], female   DOB: May 08, 1971, 44 y.o.   MRN: [id number]   Subjective:    Patient ID: [name], female    DOB: 1970/07/16, 43 y.o.   MRN: [id number]  HPI [name] is a pleasant 44 year old white female known to Dr. Carlean Purl. She has history of chronic constipation and had undergone colonoscopy in October 2014 which was a normal exam. She had been placed on lens S which she took for a period of time but says this caused abdominal pain with bowel movements and she stopped using it. She had also undergone an EGD in October 2014 which was normal duodenal biopsies were done as well also unremarkable. She comes in today with complaints of a left mid abdominal pain which is been present over the past 2 months. She says at this point she is having pain every day but it seems to wax and wane throughout the day. She says the pain is worse with straining for a bowel movement. She has not noticed any changes with by mouth intake or improvement after a bowel movement. No other positional changes that she is aware of. She says her stools are small in volume and soft. She's been taking MiraLAX but feels that that causes cramps and gas. She's not noted any melena or hematochezia. Appetite has  been fine and weight has been stable, no nausea. She is on chronic narcotics for chronic back pain, and also takes Motrin but daily.  Review of Systems Pertinent positive and negative review of systems were noted in the above HPI section.  All other review of systems was otherwise negative.  Outpatient Encounter Prescriptions as of 12/13/2014  Medication Sig  . ARIPiprazole (ABILIFY) 5 MG tablet Take 5 mg by mouth daily.  . B Complex Vitamins (B COMPLEX PO) Take 1 capsule by mouth daily.  Marland Kitchen buPROPion (WELLBUTRIN XL) 150 MG 24 hr tablet Take 150 mg by mouth daily.   . cetirizine (ZYRTEC) 10 MG tablet Take 10 mg by mouth at bedtime.  . Cholecalciferol (VITAMIN D-3) 1000 UNITS CAPS Take 2,000  Units by mouth daily.  . clonazePAM (KLONOPIN) 2 MG tablet Take 2 mg by mouth 4 (four) times daily as needed for anxiety.   . DULoxetine (CYMBALTA) 60 MG capsule Take 60 mg by mouth daily.  . fexofenadine (ALLEGRA) 180 MG tablet Take 180 mg by mouth daily.  . fluticasone (FLONASE) 50 MCG/ACT nasal spray Place 1 spray into both nostrils daily.  Marland Kitchen levothyroxine (SYNTHROID, LEVOTHROID) 175 MCG tablet Take 175 mcg by mouth daily before breakfast.   . meloxicam (MOBIC) 15 MG tablet Take 15 mg by mouth daily.   Marland Kitchen oxycodone (ROXICODONE) 30 MG immediate release tablet Take 30 mg by mouth every 4 (four) hours as needed for pain.  Marland Kitchen tiZANidine (ZANAFLEX) 4 MG tablet Take 4 mg by mouth daily as needed for muscle spasms.   Marland Kitchen VIIBRYD 40 MG TABS Take 40 mg by mouth daily.   Marland Kitchen lubiprostone (AMITIZA) 8 MCG capsule Take 1 capsule (8 mcg total) by mouth 2 (two) times daily with a meal.   No facility-administered encounter medications on file as of 12/13/2014.   Allergies  Allergen Reactions  . Dilaudid [Hydromorphone Hcl]     "heart attack"  . Darvocet [Propoxyphene N-Acetaminophen]    Patient Active Problem List   Diagnosis Date Noted  . WPW (Wolff-Parkinson-White syndrome) 12/13/2014  . Chronic back pain 12/13/2014   History   Social History  . Marital  Status: Married    Spouse Name: N/A  . Number of Children: N/A  . Years of Education: N/A   Occupational History  . Not on file.   Social History Main Topics  . Smoking status: Current Every Day Smoker    Types: Cigarettes  . Smokeless tobacco: Former Systems developer     Comment: Primary school teacher  . Alcohol Use: No  . Drug Use: No  . Sexual Activity: Not on file   Other Topics Concern  . Not on file   Social History Narrative   Retired and disabled Presenter, broadcasting for [employer]   Married no children    [name]'s family history includes Colon polyps in her mother.      Objective:    Filed Vitals:   12/13/14 0933  BP: 108/70  Pulse: 92     Physical Exam   well-developed white female in no acute distress, pleasant blood pressure 108/70 pulse 92 height 5 foot 7 weight 203. HEENT; nontraumatic normocephalic EOMI PERRLA sclera anicteric, Supple ;no JVD, Cardiovascular; regular rate and rhythm with S1-S2 no murmur or gallop, Pulmonary ;clear bilaterally, Abdomen; obese, soft she has very mild tenderness in the left upper left mid quadrant and very focal tenderness in the left mid quadrant there is no definite hernia or defect but with patient standing there is some firmness, Bowel sounds present,, Rectal; exam not done, Extremities ;no clubbing cyanosis or edema skin warm and dry, Psych; mood and affect appropriate       Assessment & Plan:   #29 44 year old female with 2 month history of fairly focal left mid quadrant abdominal pain nonradiating etiology not clear, rule out intra-abdominal inflammatory process, rule out possible ventral hernia #2 chronic constipation  Plan; Will schedule for CT of the abdomen and pelvis with contrast Check CBC with differential, being that and CRP Start trial of Amitiza 8 g twice daily Further plans pending results of CT   [name] PA-C 12/13/2014   Cc: Lona Kettle, MD

## 2014-12-13 NOTE — Patient Instructions (Addendum)
Please go to the basement level to have your labs drawn.    You have been scheduled for a CT scan of the abdomen and pelvis at [facility] ([address]---this is in the same building as [facility]).   You are scheduled today 6-16  at 4:15 PM. You should arrive at 4:00 Pm  prior to your appointment time for registration. Please follow the written instructions below on the day of your exam:  WARNING: IF YOU ARE ALLERGIC TO IODINE/X-RAY DYE, PLEASE NOTIFY RADIOLOGY IMMEDIATELY AT [phone]! YOU WILL BE GIVEN A 13 HOUR PREMEDICATION PREP.  1) Do not eat or drink anything after 12:00 noon (4 hours prior to your test) 2) You have been given 2 bottles of oral contrast to drink. The solution may taste better if refrigerated, but do NOT add ice or any other liquid to this solution. Shake  well before drinking.    Drink 1 bottle of contrast @ 2:00 Pm  (2 hours prior to your exam)  Drink 1 bottle of contrast @ 3:00 PM  (1 hour prior to your exam)  You may take any medications as prescribed with a small amount of water except for the following: Metformin, Glucophage, Glucovance, Avandamet, Riomet, Fortamet, Actoplus Met, Janumet, Glumetza or Metaglip. The above medications must be held the day of the exam AND 48 hours after the exam.  The purpose of you drinking the oral contrast is to aid in the visualization of your intestinal tract. The contrast solution may cause some diarrhea. Before your exam is started, you will be given a small amount of fluid to drink. Depending on your individual set of symptoms, you may also receive an intravenous injection of x-ray contrast/dye. Plan on being at [facility] for 30 minutes or long, depending on the type of exam you are having performed.  If you have any questions regarding your exam or if you need to reschedule, you may call the CT department at [phone] between the hours of 8:00 am and 5:00 pm,  Monday-Friday.  ________________________________________________________________________  

## 2014-12-17 ENCOUNTER — Ambulatory Visit (INDEPENDENT_AMBULATORY_CARE_PROVIDER_SITE_OTHER)
Admission: RE | Admit: 2014-12-17 | Discharge: 2014-12-17 | Disposition: A | Discharge status: Home / Self Care | Payer: BLUE CROSS/BLUE SHIELD | Source: Ambulatory Visit | Attending: Physician Assistant | Admitting: Physician Assistant

## 2014-12-17 DIAGNOSIS — G8929 Other chronic pain: Secondary | ICD-10-CM | POA: Diagnosis not present

## 2014-12-17 DIAGNOSIS — K5909 Other constipation: Secondary | ICD-10-CM

## 2014-12-17 DIAGNOSIS — K5900 Constipation, unspecified: Secondary | ICD-10-CM | POA: Diagnosis not present

## 2014-12-17 DIAGNOSIS — R1012 Left upper quadrant pain: Secondary | ICD-10-CM | POA: Diagnosis not present

## 2014-12-17 DIAGNOSIS — R1031 Right lower quadrant pain: Secondary | ICD-10-CM

## 2014-12-17 MED ORDER — IOHEXOL 300 MG/ML  SOLN
100.0000 mL | Freq: Once | INTRAMUSCULAR | Status: AC | PRN
  Administered 2014-12-17: 100 mL via INTRAVENOUS

## 2014-12-18 NOTE — Progress Notes (Signed)
Agree with Ms. [name] assessment and plan. Taylor Mayer, MD, [facility]   CT has shown uterine fibroid and an ovarian cyst Not sure if related to sxs but suggest she see her GYN - does she have one?  please call her and let me know

## 2014-12-19 ENCOUNTER — Telehealth: Payer: Self-pay | Admitting: Internal Medicine

## 2014-12-19 NOTE — Telephone Encounter (Signed)
Patient is advised.  

## 2015-01-04 ENCOUNTER — Ambulatory Visit
Admission: RE | Admit: 2015-01-04 | Discharge: 2015-01-04 | Disposition: A | Discharge status: Home / Self Care | Payer: Self-pay | Source: Ambulatory Visit | Attending: Family Medicine | Admitting: Family Medicine

## 2015-01-04 ENCOUNTER — Other Ambulatory Visit: Payer: Self-pay | Admitting: Family Medicine

## 2015-01-04 DIAGNOSIS — M542 Cervicalgia: Secondary | ICD-10-CM

## 2015-01-04 DIAGNOSIS — M25561 Pain in right knee: Secondary | ICD-10-CM

## 2015-01-04 DIAGNOSIS — M545 Low back pain: Secondary | ICD-10-CM

## 2015-01-04 DIAGNOSIS — M549 Dorsalgia, unspecified: Secondary | ICD-10-CM

## 2015-02-27 DIAGNOSIS — I491 Atrial premature depolarization: Secondary | ICD-10-CM | POA: Insufficient documentation

## 2015-02-27 DIAGNOSIS — E039 Hypothyroidism, unspecified: Secondary | ICD-10-CM | POA: Insufficient documentation

## 2015-02-27 DIAGNOSIS — R002 Palpitations: Secondary | ICD-10-CM | POA: Insufficient documentation

## 2015-03-26 DIAGNOSIS — I493 Ventricular premature depolarization: Secondary | ICD-10-CM | POA: Insufficient documentation

## 2015-03-26 DIAGNOSIS — R0602 Shortness of breath: Secondary | ICD-10-CM | POA: Insufficient documentation

## 2015-04-05 ENCOUNTER — Other Ambulatory Visit: Payer: Self-pay | Admitting: Family Medicine

## 2015-04-05 DIAGNOSIS — R202 Paresthesia of skin: Secondary | ICD-10-CM

## 2015-04-11 ENCOUNTER — Other Ambulatory Visit: Payer: Self-pay | Admitting: Obstetrics and Gynecology

## 2015-04-11 DIAGNOSIS — N6311 Unspecified lump in the right breast, upper outer quadrant: Secondary | ICD-10-CM

## 2015-04-12 ENCOUNTER — Other Ambulatory Visit: Payer: Self-pay

## 2015-04-16 ENCOUNTER — Other Ambulatory Visit: Payer: Self-pay

## 2015-04-17 ENCOUNTER — Ambulatory Visit
Admission: RE | Admit: 2015-04-17 | Discharge: 2015-04-17 | Disposition: A | Discharge status: Home / Self Care | Payer: BLUE CROSS/BLUE SHIELD | Source: Ambulatory Visit | Attending: Obstetrics and Gynecology | Admitting: Obstetrics and Gynecology

## 2015-04-17 DIAGNOSIS — N6311 Unspecified lump in the right breast, upper outer quadrant: Secondary | ICD-10-CM

## 2015-04-18 ENCOUNTER — Inpatient Hospital Stay: Admission: RE | Admit: 2015-04-18 | Payer: Self-pay | Source: Ambulatory Visit

## 2015-07-29 MED FILL — PREGNYL 10,000 UNITS VIAL: 10000 | 1 days supply | Qty: 1 | Fill #0

## 2015-10-18 DIAGNOSIS — Z79891 Long term (current) use of opiate analgesic: Secondary | ICD-10-CM | POA: Diagnosis not present

## 2015-11-07 DIAGNOSIS — N764 Abscess of vulva: Secondary | ICD-10-CM | POA: Diagnosis not present

## 2015-11-07 DIAGNOSIS — M26609 Unspecified temporomandibular joint disorder, unspecified side: Secondary | ICD-10-CM | POA: Diagnosis not present

## 2015-11-19 DIAGNOSIS — M546 Pain in thoracic spine: Secondary | ICD-10-CM | POA: Diagnosis not present

## 2015-11-19 DIAGNOSIS — M79605 Pain in left leg: Secondary | ICD-10-CM | POA: Diagnosis not present

## 2015-11-19 DIAGNOSIS — G894 Chronic pain syndrome: Secondary | ICD-10-CM | POA: Diagnosis not present

## 2015-11-19 DIAGNOSIS — Z79891 Long term (current) use of opiate analgesic: Secondary | ICD-10-CM | POA: Diagnosis not present

## 2015-11-19 DIAGNOSIS — M25512 Pain in left shoulder: Secondary | ICD-10-CM | POA: Diagnosis not present

## 2015-11-20 ENCOUNTER — Other Ambulatory Visit: Payer: Self-pay | Admitting: Obstetrics and Gynecology

## 2015-11-21 DIAGNOSIS — R351 Nocturia: Secondary | ICD-10-CM | POA: Diagnosis not present

## 2015-11-21 DIAGNOSIS — Z0000 Encounter for general adult medical examination without abnormal findings: Secondary | ICD-10-CM | POA: Diagnosis not present

## 2015-11-21 DIAGNOSIS — N200 Calculus of kidney: Secondary | ICD-10-CM | POA: Diagnosis not present

## 2015-11-28 DIAGNOSIS — M25532 Pain in left wrist: Secondary | ICD-10-CM | POA: Diagnosis not present

## 2015-11-28 DIAGNOSIS — G5602 Carpal tunnel syndrome, left upper limb: Secondary | ICD-10-CM | POA: Diagnosis not present

## 2015-12-18 DIAGNOSIS — M546 Pain in thoracic spine: Secondary | ICD-10-CM | POA: Diagnosis not present

## 2015-12-18 DIAGNOSIS — M79605 Pain in left leg: Secondary | ICD-10-CM | POA: Diagnosis not present

## 2015-12-18 DIAGNOSIS — G894 Chronic pain syndrome: Secondary | ICD-10-CM | POA: Diagnosis not present

## 2015-12-18 DIAGNOSIS — M25512 Pain in left shoulder: Secondary | ICD-10-CM | POA: Diagnosis not present

## 2015-12-18 DIAGNOSIS — Z79891 Long term (current) use of opiate analgesic: Secondary | ICD-10-CM | POA: Diagnosis not present

## 2016-01-15 DIAGNOSIS — Z9889 Other specified postprocedural states: Secondary | ICD-10-CM | POA: Diagnosis not present

## 2016-01-15 DIAGNOSIS — M5440 Lumbago with sciatica, unspecified side: Secondary | ICD-10-CM | POA: Diagnosis not present

## 2016-01-16 ENCOUNTER — Other Ambulatory Visit: Payer: Self-pay | Admitting: Physician Assistant

## 2016-01-16 DIAGNOSIS — M545 Low back pain: Secondary | ICD-10-CM

## 2016-01-19 ENCOUNTER — Ambulatory Visit
Admission: RE | Admit: 2016-01-19 | Discharge: 2016-01-19 | Disposition: A | Discharge status: Home / Self Care | Payer: BLUE CROSS/BLUE SHIELD | Source: Ambulatory Visit | Attending: Physician Assistant | Admitting: Physician Assistant

## 2016-01-19 DIAGNOSIS — M545 Low back pain: Secondary | ICD-10-CM

## 2016-01-19 DIAGNOSIS — M5126 Other intervertebral disc displacement, lumbar region: Secondary | ICD-10-CM | POA: Diagnosis not present

## 2016-01-20 DIAGNOSIS — M545 Low back pain: Secondary | ICD-10-CM | POA: Diagnosis not present

## 2016-01-20 DIAGNOSIS — Z79891 Long term (current) use of opiate analgesic: Secondary | ICD-10-CM | POA: Diagnosis not present

## 2016-01-20 DIAGNOSIS — M25552 Pain in left hip: Secondary | ICD-10-CM | POA: Diagnosis not present

## 2016-01-20 DIAGNOSIS — G894 Chronic pain syndrome: Secondary | ICD-10-CM | POA: Diagnosis not present

## 2016-01-20 DIAGNOSIS — M25551 Pain in right hip: Secondary | ICD-10-CM | POA: Diagnosis not present

## 2016-01-20 DIAGNOSIS — Z6834 Body mass index (BMI) 34.0-34.9, adult: Secondary | ICD-10-CM | POA: Diagnosis not present

## 2016-02-14 DIAGNOSIS — R61 Generalized hyperhidrosis: Secondary | ICD-10-CM | POA: Diagnosis not present

## 2016-02-14 DIAGNOSIS — N951 Menopausal and female climacteric states: Secondary | ICD-10-CM | POA: Diagnosis not present

## 2016-02-14 DIAGNOSIS — L709 Acne, unspecified: Secondary | ICD-10-CM | POA: Diagnosis not present

## 2016-02-14 DIAGNOSIS — M62838 Other muscle spasm: Secondary | ICD-10-CM | POA: Diagnosis not present

## 2016-02-19 DIAGNOSIS — M546 Pain in thoracic spine: Secondary | ICD-10-CM | POA: Diagnosis not present

## 2016-02-19 DIAGNOSIS — G894 Chronic pain syndrome: Secondary | ICD-10-CM | POA: Diagnosis not present

## 2016-02-19 DIAGNOSIS — Z79891 Long term (current) use of opiate analgesic: Secondary | ICD-10-CM | POA: Diagnosis not present

## 2016-02-19 DIAGNOSIS — M542 Cervicalgia: Secondary | ICD-10-CM | POA: Diagnosis not present

## 2016-02-19 DIAGNOSIS — M545 Low back pain: Secondary | ICD-10-CM | POA: Diagnosis not present

## 2016-02-20 DIAGNOSIS — L814 Other melanin hyperpigmentation: Secondary | ICD-10-CM | POA: Diagnosis not present

## 2016-02-20 DIAGNOSIS — L918 Other hypertrophic disorders of the skin: Secondary | ICD-10-CM | POA: Diagnosis not present

## 2016-02-20 DIAGNOSIS — L820 Inflamed seborrheic keratosis: Secondary | ICD-10-CM | POA: Diagnosis not present

## 2016-02-20 DIAGNOSIS — L821 Other seborrheic keratosis: Secondary | ICD-10-CM | POA: Diagnosis not present

## 2016-03-02 DIAGNOSIS — M10072 Idiopathic gout, left ankle and foot: Secondary | ICD-10-CM | POA: Diagnosis not present

## 2016-03-13 DIAGNOSIS — Z23 Encounter for immunization: Secondary | ICD-10-CM | POA: Diagnosis not present

## 2016-03-13 DIAGNOSIS — M5412 Radiculopathy, cervical region: Secondary | ICD-10-CM | POA: Diagnosis not present

## 2016-03-16 DIAGNOSIS — M4724 Other spondylosis with radiculopathy, thoracic region: Secondary | ICD-10-CM | POA: Diagnosis not present

## 2016-03-16 DIAGNOSIS — M519 Unspecified thoracic, thoracolumbar and lumbosacral intervertebral disc disorder: Secondary | ICD-10-CM | POA: Diagnosis not present

## 2016-03-17 DIAGNOSIS — Z6834 Body mass index (BMI) 34.0-34.9, adult: Secondary | ICD-10-CM | POA: Diagnosis not present

## 2016-03-17 DIAGNOSIS — M542 Cervicalgia: Secondary | ICD-10-CM | POA: Diagnosis not present

## 2016-03-20 DIAGNOSIS — G894 Chronic pain syndrome: Secondary | ICD-10-CM | POA: Diagnosis not present

## 2016-03-20 DIAGNOSIS — M79602 Pain in left arm: Secondary | ICD-10-CM | POA: Diagnosis not present

## 2016-03-20 DIAGNOSIS — M79622 Pain in left upper arm: Secondary | ICD-10-CM | POA: Diagnosis not present

## 2016-03-20 DIAGNOSIS — Z79891 Long term (current) use of opiate analgesic: Secondary | ICD-10-CM | POA: Diagnosis not present

## 2016-03-20 DIAGNOSIS — M25512 Pain in left shoulder: Secondary | ICD-10-CM | POA: Diagnosis not present

## 2016-03-21 DIAGNOSIS — M4802 Spinal stenosis, cervical region: Secondary | ICD-10-CM | POA: Diagnosis not present

## 2016-03-21 DIAGNOSIS — M542 Cervicalgia: Secondary | ICD-10-CM | POA: Diagnosis not present

## 2016-03-24 DIAGNOSIS — M542 Cervicalgia: Secondary | ICD-10-CM | POA: Diagnosis not present

## 2016-03-24 DIAGNOSIS — Z6834 Body mass index (BMI) 34.0-34.9, adult: Secondary | ICD-10-CM | POA: Diagnosis not present

## 2016-04-01 DIAGNOSIS — F900 Attention-deficit hyperactivity disorder, predominantly inattentive type: Secondary | ICD-10-CM | POA: Diagnosis not present

## 2016-04-01 DIAGNOSIS — F3342 Major depressive disorder, recurrent, in full remission: Secondary | ICD-10-CM | POA: Diagnosis not present

## 2016-04-06 ENCOUNTER — Encounter: Payer: Self-pay | Admitting: Podiatry

## 2016-04-06 ENCOUNTER — Ambulatory Visit (INDEPENDENT_AMBULATORY_CARE_PROVIDER_SITE_OTHER): Payer: BLUE CROSS/BLUE SHIELD | Admitting: Podiatry

## 2016-04-06 ENCOUNTER — Ambulatory Visit (INDEPENDENT_AMBULATORY_CARE_PROVIDER_SITE_OTHER): Payer: BLUE CROSS/BLUE SHIELD

## 2016-04-06 VITALS — Resp 16 | Ht 67.0 in | Wt 193.0 lb

## 2016-04-06 DIAGNOSIS — M79672 Pain in left foot: Secondary | ICD-10-CM | POA: Diagnosis not present

## 2016-04-06 DIAGNOSIS — M21622 Bunionette of left foot: Secondary | ICD-10-CM | POA: Diagnosis not present

## 2016-04-06 DIAGNOSIS — M79671 Pain in right foot: Secondary | ICD-10-CM | POA: Diagnosis not present

## 2016-04-06 DIAGNOSIS — M21619 Bunion of unspecified foot: Secondary | ICD-10-CM | POA: Diagnosis not present

## 2016-04-06 NOTE — Progress Notes (Signed)
   Subjective:    Patient ID: [name], female    DOB: 08/29/1970, 45 y.o.   MRN: [id number]  HPI Chief Complaint  Patient presents with  . Foot Pain    Left foot; bunion; pt stated, "Has been hurting for the past 2 months"      Review of Systems  Constitutional: Positive for unexpected weight change.  HENT: Positive for sinus pressure.   Musculoskeletal: Positive for back pain and gait problem.  Allergic/Immunologic: Positive for environmental allergies.  All other systems reviewed and are negative.      Objective:   Physical Exam        Assessment & Plan:

## 2016-04-06 NOTE — Patient Instructions (Addendum)
Bunionectomy A bunionectomy is a surgical procedure to remove a bunion. A bunion is a visible bump of bone on the inside of your foot where your big toe meets the rest of your foot. A bunion can develop when pressure turns this bone (first metatarsal) toward the other toes. Shoes that are too tight are the most common cause of bunions. Bunions can also be caused by diseases, such as arthritis and polio. You may need a bunionectomy if your bunion is very large and painful or it affects your ability to walk. LET [facility] CARE PROVIDER KNOW ABOUT:  Any allergies you have.  All medicines you are taking, including vitamins, herbs, eye drops, creams, and over-the-counter medicines.  Previous problems you or members of your family have had with the use of anesthetics.  Any blood disorders you have.  Previous surgeries you have had.  Medical conditions you have. RISKS AND COMPLICATIONS  Generally, this is a safe procedure. However, problems may occur, including:  Infection.  Pain.  Nerve damage.  Bleeding or blood clots.  Reactions to medicines.  Numbness, stiffness, or arthritis in your toe.  Foot problems that continue even after the procedure. BEFORE THE PROCEDURE  Ask your health care provider about:  Changing or stopping your regular medicines. This is especially important if you are taking diabetes medicines or blood thinners.  Taking medicines such as aspirin and ibuprofen. These medicines can thin your blood. Do not take these medicines before your procedure if your health care provider instructs you not to.  Do not drink alcohol before the procedure as directed by your health care provider.  Do not use tobacco products, including cigarettes, chewing tobacco, or electronic cigarettes, before the procedure as directed by your health care provider. If you need help quitting, ask your health care provider.  Ask your health care provider what kind of medicine you will be  given during your procedure. A bunionectomy may be done using one of these:  A medicine that numbs the area (local anesthetic).  A medicine that makes you go to sleep (general anesthetic). If you will be given general anesthetic, do not eat or drink anything after midnight on the night before the procedure or as directed by your health care provider. PROCEDURE  An IV tube may be inserted into a vein.  You will be given local anesthetic or general anesthetic.  The surgeon will make a cut (incision) over the enlarged area at the first joint of the big toe. The surgeon will remove the bunion.  You may have more than one incision if any of the bones in your big toe need to be moved. A bone itself may need to be cut.  Sometimes the tissues around the big toe may also need to be cut then tightened or loosened to reposition the toe.  Screws or other hardware may be used to keep your foot in thecorrect position.  The incision will be closed with stitches (sutures) and covered with adhesive strips or another type of bandage (dressing). AFTER THE PROCEDURE  You may spend some time in a recovery area.  Your blood pressure, heart rate, breathing rate, and blood oxygen level will be monitored often until the medicines you were given have worn off.   This information is not intended to replace advice given to you by your health care provider. Make sure you discuss any questions you have with your health care provider.   Document Released: 05/29/2005 Document Revised: 03/06/2015 Document Reviewed: 01/31/2014  Elsevier Interactive Patient Education 2016 [place] Instructions  Congratulations, you have decided to take an important step to improving your quality of life.  You can be assured that the doctors of [facility] will be with you every step of the way.  1. Plan to be at the surgery center/hospital at least 1 (one) hour prior to your scheduled time unless  otherwise directed by the surgical center/hospital staff.  You must have a responsible adult accompany you, remain during the surgery and drive you home.  Make sure you have directions to the surgical center/hospital and know how to get there on time. 2. For hospital based surgery you will need to obtain a history and physical form from your family physician within 1 month prior to the date of surgery- we will give you a form for you primary physician.  3. We make every effort to accommodate the date you request for surgery.  There are however, times where surgery dates or times have to be moved.  We will contact you as soon as possible if a change in schedule is required.   4. No Aspirin/Ibuprofen for one week before surgery.  If you are on aspirin, any non-steroidal anti-inflammatory medications (Mobic, Aleve, Ibuprofen) you should stop taking it 7 days prior to your surgery.  You make take Tylenol  For pain prior to surgery.  5. Medications- If you are taking daily heart and blood pressure medications, seizure, reflux, allergy, asthma, anxiety, pain or diabetes medications, make sure the surgery center/hospital is aware before the day of surgery so they may notify you which medications to take or avoid the day of surgery. 6. No food or drink after midnight the night before surgery unless directed otherwise by surgical center/hospital staff. 7. No alcoholic beverages 24 hours prior to surgery.  No smoking 24 hours prior to or 24 hours after surgery. 8. Wear loose pants or shorts- loose enough to fit over bandages, boots, and casts. 9. No slip on shoes, sneakers are best. 10. Bring your boot with you to the surgery center/hospital.  Also bring crutches or a walker if your physician has prescribed it for you.  If you do not have this equipment, it will be provided for you after surgery. 11. If you have not been contracted by the surgery center/hospital by the day before your surgery, call to confirm the  date and time of your surgery. 12. Leave-time from work may vary depending on the type of surgery you have.  Appropriate arrangements should be made prior to surgery with your employer. 13. Prescriptions will be provided immediately following surgery by your doctor.  Have these filled as soon as possible after surgery and take the medication as directed. 14. Remove nail polish on the operative foot. 15. Wash the night before surgery.  The night before surgery wash the foot and leg well with the antibacterial soap provided and water paying special attention to beneath the toenails and in between the toes.  Rinse thoroughly with water and dry well with a towel.  Perform this wash unless told not to do so by your physician.  Enclosed: 1 Ice pack (please put in freezer the night before surgery)   1 Hibiclens skin cleaner   Pre-op Instructions  If you have any questions regarding the instructions, do not hesitate to call our office.  [place]: [place], [place] [address] [place]: [address], [place], [place] [address] [phone]  [place]: [address]  [place], [place] [address] [phone]   Dr. Giancarlo Askren DPM, Dr. Matthew Wagoner DPM, Dr. M. Todd Hyatt DPM, Dr. Titorya Stover DPM  

## 2016-04-07 NOTE — Progress Notes (Signed)
Subjective:     Patient ID: [name], female   DOB: 1970/11/28, 45 y.o.   MRN: [id number]  HPI patient presents stating she's having a lot of pain with the left foot and has tried wider shoes is tried other modalities without relief and it seems like it's getting worse over time states that she has family history of this problem   Review of Systems  All other systems reviewed and are negative.      Objective:   Physical Exam  Constitutional: She is oriented to person, place, and time.  Cardiovascular: Intact distal pulses.   Musculoskeletal: Normal range of motion.  Neurological: She is oriented to person, place, and time.  Skin: Skin is warm.  Nursing note and vitals reviewed.  neurovascular status intact muscle strength adequate range of motion within normal limits with patient found to have hyperostosis medial aspect first metatarsal head left over right with redness around the joint surface and pain around the fifth metatarsal head left over right with redness around the surface and discomfort with help patient. 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 Patient states that she needs this fixed and due to her schedule needs to get it done soon and would like to review consent form today concerning correction. I did review with her consent form going over alternative treatments complications and risk and she is willing to accept this wants surgery and signs consent form after extensive review. I also dispensed air fracture walker at this time and she understands recovery can take from 6 months to one year and she is encouraged to call with questions prior to procedure  X-ray report indicates that there is structural bunion formation with deviation the  hallux against second toe and elevation of the intermetatarsal angle of approximately 15 with tailor bunion deformity left over right

## 2016-04-08 DIAGNOSIS — L820 Inflamed seborrheic keratosis: Secondary | ICD-10-CM | POA: Diagnosis not present

## 2016-04-10 ENCOUNTER — Telehealth: Payer: Self-pay | Admitting: *Deleted

## 2016-04-10 NOTE — Telephone Encounter (Signed)
I am calling in regards to your surgery.  They gave you a date of 05/12/2016.  That date is not available.  Can you do surgery on 05/14/2016?  "I'm being told that date is not available.  This is the 2nd date that I have been told is not available."  I am sorry it is the busy season for [place].  Several people are trying to schedule before the end of the year due to deductibles being met.  I am sorry.  "Does he have anything before then available?"  No, not at this time.  "Well I guess put me down for that date.  What time will I have to be there?"  It will be around lunch time.  "Oh great, I can't eat anything after midnight!"

## 2016-04-17 DIAGNOSIS — M542 Cervicalgia: Secondary | ICD-10-CM | POA: Diagnosis not present

## 2016-04-17 DIAGNOSIS — M546 Pain in thoracic spine: Secondary | ICD-10-CM | POA: Diagnosis not present

## 2016-04-17 DIAGNOSIS — M79622 Pain in left upper arm: Secondary | ICD-10-CM | POA: Diagnosis not present

## 2016-04-17 DIAGNOSIS — Z79891 Long term (current) use of opiate analgesic: Secondary | ICD-10-CM | POA: Diagnosis not present

## 2016-04-17 DIAGNOSIS — G894 Chronic pain syndrome: Secondary | ICD-10-CM | POA: Diagnosis not present

## 2016-04-21 ENCOUNTER — Telehealth: Payer: Self-pay | Admitting: *Deleted

## 2016-04-21 DIAGNOSIS — E8941 Symptomatic postprocedural ovarian failure: Secondary | ICD-10-CM | POA: Diagnosis not present

## 2016-04-21 DIAGNOSIS — Z6835 Body mass index (BMI) 35.0-35.9, adult: Secondary | ICD-10-CM | POA: Diagnosis not present

## 2016-04-21 DIAGNOSIS — Z1151 Encounter for screening for human papillomavirus (HPV): Secondary | ICD-10-CM | POA: Diagnosis not present

## 2016-04-21 DIAGNOSIS — R87610 Atypical squamous cells of undetermined significance on cytologic smear of cervix (ASC-US): Secondary | ICD-10-CM | POA: Diagnosis not present

## 2016-04-21 DIAGNOSIS — Z1231 Encounter for screening mammogram for malignant neoplasm of breast: Secondary | ICD-10-CM | POA: Diagnosis not present

## 2016-04-21 DIAGNOSIS — Z0000 Encounter for general adult medical examination without abnormal findings: Secondary | ICD-10-CM | POA: Diagnosis not present

## 2016-04-21 DIAGNOSIS — Z124 Encounter for screening for malignant neoplasm of cervix: Secondary | ICD-10-CM | POA: Diagnosis not present

## 2016-04-21 DIAGNOSIS — Z1389 Encounter for screening for other disorder: Secondary | ICD-10-CM | POA: Diagnosis not present

## 2016-04-21 DIAGNOSIS — Z01419 Encounter for gynecological examination (general) (routine) without abnormal findings: Secondary | ICD-10-CM | POA: Diagnosis not present

## 2016-04-21 NOTE — Telephone Encounter (Signed)
"  I have foot surgery scheduled for November 16 with Dr. Paulla Dolly.  I want to see if I can change the date to November 20 or 21st.  I have pain management the following week and I won't be able to get here if I have surgery.  I have had 5 back surgeries and I go to pain management every month.

## 2016-04-22 ENCOUNTER — Telehealth: Payer: Self-pay | Admitting: *Deleted

## 2016-04-22 NOTE — Telephone Encounter (Signed)
I called and left patient a message that surgery was rescheduled from 05/14/2016 to 05/19/2016.  Call if you have any further questions.

## 2016-04-22 NOTE — Telephone Encounter (Signed)
Per Dawn, patient called about her schedule change for surgery.  She is upset we have changed it again and wants it sooner.  I am returning your call.  You had called and stated you wanted to change it to November 20 or 21.  You said you have pain management once a month and it was time for it.  "I had called back and said to leave it as is.  Let me look at my schedule, I have so many appointments.  I am sorry I have pain management on November 20, just leave it scheduled for November 21.  I am so sorry.  What time am I supposed to be there for the surgery?"  Someone will call you the Friday or Monday before and give you the arrival time.  "Is there anything else I need to do?  I didn't receive any information and can I schedule my the surgery for my other foot."  You are scheduled for a consultation on 05/04/2016 with Dr. Paulla Dolly, you will receive surgery information then.  I can put you down tentatively for surgery on June 23, 2016.  "That date will be fine.  Thank you so much.

## 2016-04-23 ENCOUNTER — Other Ambulatory Visit: Payer: Self-pay | Admitting: Obstetrics and Gynecology

## 2016-04-23 DIAGNOSIS — R928 Other abnormal and inconclusive findings on diagnostic imaging of breast: Secondary | ICD-10-CM

## 2016-04-28 ENCOUNTER — Ambulatory Visit
Admission: RE | Admit: 2016-04-28 | Discharge: 2016-04-28 | Disposition: A | Discharge status: Home / Self Care | Payer: BLUE CROSS/BLUE SHIELD | Source: Ambulatory Visit | Attending: Obstetrics and Gynecology | Admitting: Obstetrics and Gynecology

## 2016-04-28 DIAGNOSIS — R928 Other abnormal and inconclusive findings on diagnostic imaging of breast: Secondary | ICD-10-CM

## 2016-04-28 DIAGNOSIS — N6002 Solitary cyst of left breast: Secondary | ICD-10-CM | POA: Diagnosis not present

## 2016-04-28 DIAGNOSIS — N6001 Solitary cyst of right breast: Secondary | ICD-10-CM | POA: Diagnosis not present

## 2016-04-28 DIAGNOSIS — N6320 Unspecified lump in the left breast, unspecified quadrant: Secondary | ICD-10-CM | POA: Diagnosis not present

## 2016-05-01 DIAGNOSIS — S5001XS Contusion of right elbow, sequela: Secondary | ICD-10-CM | POA: Diagnosis not present

## 2016-05-04 ENCOUNTER — Ambulatory Visit: Payer: BLUE CROSS/BLUE SHIELD | Admitting: Podiatry

## 2016-05-13 ENCOUNTER — Encounter: Payer: Self-pay | Admitting: Podiatry

## 2016-05-13 ENCOUNTER — Ambulatory Visit (INDEPENDENT_AMBULATORY_CARE_PROVIDER_SITE_OTHER): Payer: BLUE CROSS/BLUE SHIELD | Admitting: Podiatry

## 2016-05-13 DIAGNOSIS — M21619 Bunion of unspecified foot: Secondary | ICD-10-CM

## 2016-05-13 DIAGNOSIS — M21622 Bunionette of left foot: Secondary | ICD-10-CM | POA: Diagnosis not present

## 2016-05-13 NOTE — Patient Instructions (Signed)

## 2016-05-14 NOTE — Progress Notes (Signed)
Subjective:     Patient ID: [name], female   DOB: [birth year], 45 y.o.   MRN: [id number]  HPI patient presents stating I want to get both my feet fixed by the end of the year and I'm excited about getting this left one fixed as it's been very sore   Review of Systems     Objective:   Physical Exam Neurovascular status found to be intact with patient noted to have quite a bit of discomfort in the left first metatarsal head and left fifth metatarsal head with moderate pain also noted in the right with redness around the joint surface and pain when palpated    Assessment:     Bunion deformity worse clinically than radiographically and painful when palpated along with tailor's bunion deformity bilateral    Plan:     H&P and the x-rays were reviewed with patient and I allowed her to read consent form for left foot correction. I explained to her all possible alternative treatments complications and surgery itself and she wants procedures understanding risk and signs consent form. Patient is scheduled for outpatient surgery [facility] surgical center and is encouraged to call with questions prior to procedure and is dispensed air fracture walker today with all instructions on usage and understands total recovery can take 6 months to one year

## 2016-05-18 DIAGNOSIS — M542 Cervicalgia: Secondary | ICD-10-CM | POA: Diagnosis not present

## 2016-05-18 DIAGNOSIS — M546 Pain in thoracic spine: Secondary | ICD-10-CM | POA: Diagnosis not present

## 2016-05-18 DIAGNOSIS — M79622 Pain in left upper arm: Secondary | ICD-10-CM | POA: Diagnosis not present

## 2016-05-18 DIAGNOSIS — G894 Chronic pain syndrome: Secondary | ICD-10-CM | POA: Diagnosis not present

## 2016-05-18 DIAGNOSIS — Z79891 Long term (current) use of opiate analgesic: Secondary | ICD-10-CM | POA: Diagnosis not present

## 2016-05-19 ENCOUNTER — Encounter: Payer: Self-pay | Admitting: Podiatry

## 2016-05-19 DIAGNOSIS — M21612 Bunion of left foot: Secondary | ICD-10-CM | POA: Diagnosis not present

## 2016-05-19 DIAGNOSIS — M21622 Bunionette of left foot: Secondary | ICD-10-CM | POA: Diagnosis not present

## 2016-05-19 DIAGNOSIS — M21542 Acquired clubfoot, left foot: Secondary | ICD-10-CM | POA: Diagnosis not present

## 2016-05-19 DIAGNOSIS — G4730 Sleep apnea, unspecified: Secondary | ICD-10-CM | POA: Diagnosis not present

## 2016-05-19 DIAGNOSIS — M2042 Other hammer toe(s) (acquired), left foot: Secondary | ICD-10-CM | POA: Diagnosis not present

## 2016-05-19 DIAGNOSIS — M2012 Hallux valgus (acquired), left foot: Secondary | ICD-10-CM | POA: Diagnosis not present

## 2016-05-19 DIAGNOSIS — M25572 Pain in left ankle and joints of left foot: Secondary | ICD-10-CM | POA: Diagnosis not present

## 2016-05-20 ENCOUNTER — Telehealth: Payer: Self-pay | Admitting: *Deleted

## 2016-05-20 NOTE — Telephone Encounter (Signed)
Pt states she needs a nurse to call her. Ispoke with pt and she asked if she could shower if she had her foot bagged up. I told her yes, not to do it without someone home to help, and that she may find bathing in the sink easier. Pt states understanding.

## 2016-05-25 ENCOUNTER — Telehealth: Payer: Self-pay | Admitting: *Deleted

## 2016-05-25 NOTE — Telephone Encounter (Addendum)
Pt asked if she bagged up the surgery foot could she shower.I told pt she could try, I recommend a birdbath/sink bath due to the bag getting slick, and not to shower without someone home. Pt asked if she could drive to her post op check Friday. I told her if she couldn't get anyone else to drive, we did need to see her. 06/26/2016-Pt states the right foot that she had surgery on 3 days ago, is having throbbing, burning pain, and the boot is pressing and she wants a soft shoe and if no one calls she will call Dr. Paulla Dolly. I spoke with pt and told her there were 2 things we could quickly do and it would help. I told pt to remove the surgical boot, open-ended sock, and ace wrap only, elevate the foot 15 minutes, but it the pain worsened dangle the foot for 15 minutes, this being the only time it would be good to dangle the surgery foot. I instructed pt after 15 minutes to rewrap the ace looser, reapply the open-ended sock and the boot. I told pt that with the ace thinner and looser the foot should be more comfortable in the boot. I gave pt the schedulers x120 and told her to call me in an hour. Pt states understanding.07/08/2016-Pt states she turned her foot over walking in the surgical shoe and the tailors bunion is hurting. I instructed pt to go back into the big surgical boot, rest and ice as if just out of surgery and transferred to schedulers for an appt tomorrow.

## 2016-05-27 ENCOUNTER — Ambulatory Visit (INDEPENDENT_AMBULATORY_CARE_PROVIDER_SITE_OTHER): Payer: BLUE CROSS/BLUE SHIELD

## 2016-05-27 ENCOUNTER — Ambulatory Visit (INDEPENDENT_AMBULATORY_CARE_PROVIDER_SITE_OTHER): Payer: BLUE CROSS/BLUE SHIELD | Admitting: Podiatry

## 2016-05-27 VITALS — BP 110/81 | HR 87 | Resp 16

## 2016-05-27 DIAGNOSIS — Z9889 Other specified postprocedural states: Secondary | ICD-10-CM

## 2016-05-27 DIAGNOSIS — M21622 Bunionette of left foot: Secondary | ICD-10-CM

## 2016-05-27 DIAGNOSIS — M21619 Bunion of unspecified foot: Secondary | ICD-10-CM

## 2016-05-28 NOTE — Progress Notes (Signed)
Subjective:     Patient ID: [name], female   DOB: 1971-03-18, 45 y.o.   MRN: [id number]  HPI patient states that my left foot is improving but I still gets swelling and pain if him on it too much   Review of Systems     Objective:   Physical Exam Neurovascular status intact negative Homans sign was noted with patient's left first and fifth metatarsal healing well with wound edges well coapted and no erythema noted and mild edema consistent with this. Postop.    Assessment:     Doing well post osteotomy first metatarsal fifth metatarsal left    Plan:     H&P reviewed and sterile dressing reapplied. Instructed on the continued utilization of elevation compression and immobilization and will reappoint 2 weeks and did discuss slight movement of the fifth metatarsal and that I do not want her to walk without protection of this area  X-ray report indicates pins and screw are in place with slight movement fifth metatarsal but it should heal uneventfully this position and will be watched over the next few weeks. Again encouraged her not to do any weightbearing without proper immobilization of the

## 2016-06-05 ENCOUNTER — Encounter: Payer: Self-pay | Admitting: Podiatry

## 2016-06-11 ENCOUNTER — Encounter: Payer: Self-pay | Admitting: Podiatry

## 2016-06-11 ENCOUNTER — Ambulatory Visit (INDEPENDENT_AMBULATORY_CARE_PROVIDER_SITE_OTHER): Payer: BLUE CROSS/BLUE SHIELD

## 2016-06-11 ENCOUNTER — Ambulatory Visit (INDEPENDENT_AMBULATORY_CARE_PROVIDER_SITE_OTHER): Payer: BLUE CROSS/BLUE SHIELD | Admitting: Podiatry

## 2016-06-11 VITALS — BP 119/76 | HR 110 | Resp 16

## 2016-06-11 DIAGNOSIS — Z9889 Other specified postprocedural states: Secondary | ICD-10-CM

## 2016-06-11 DIAGNOSIS — M21622 Bunionette of left foot: Secondary | ICD-10-CM

## 2016-06-11 DIAGNOSIS — M2012 Hallux valgus (acquired), left foot: Secondary | ICD-10-CM

## 2016-06-11 DIAGNOSIS — M21619 Bunion of unspecified foot: Secondary | ICD-10-CM

## 2016-06-11 NOTE — Patient Instructions (Signed)
Pre-Operative Instructions  Congratulations, you have decided to take an important step to improving your quality of life.  You can be assured that the doctors of [facility] will be with you every step of the way.  1. Plan to be at the surgery center/hospital at least 1 (one) hour prior to your scheduled time unless otherwise directed by the surgical center/hospital staff.  You must have a responsible adult accompany you, remain during the surgery and drive you home.  Make sure you have directions to the surgical center/hospital and know how to get there on time. 2. For hospital based surgery you will need to obtain a history and physical form from your family physician within 1 month prior to the date of surgery- we will give you a form for you primary physician.  3. We make every effort to accommodate the date you request for surgery.  There are however, times where surgery dates or times have to be moved.  We will contact you as soon as possible if a change in schedule is required.   4. No Aspirin/Ibuprofen for one week before surgery.  If you are on aspirin, any non-steroidal anti-inflammatory medications (Mobic, Aleve, Ibuprofen) you should stop taking it 7 days prior to your surgery.  You make take Tylenol  For pain prior to surgery.  5. Medications- If you are taking daily heart and blood pressure medications, seizure, reflux, allergy, asthma, anxiety, pain or diabetes medications, make sure the surgery center/hospital is aware before the day of surgery so they may notify you which medications to take or avoid the day of surgery. 6. No food or drink after midnight the night before surgery unless directed otherwise by surgical center/hospital staff. 7. No alcoholic beverages 24 hours prior to surgery.  No smoking 24 hours prior to or 24 hours after surgery. 8. Wear loose pants or shorts- loose enough to fit over bandages, boots, and casts. 9. No slip on shoes, sneakers are best. 10. Bring  your boot with you to the surgery center/hospital.  Also bring crutches or a walker if your physician has prescribed it for you.  If you do not have this equipment, it will be provided for you after surgery. 11. If you have not been contracted by the surgery center/hospital by the day before your surgery, call to confirm the date and time of your surgery. 12. Leave-time from work may vary depending on the type of surgery you have.  Appropriate arrangements should be made prior to surgery with your employer. 13. Prescriptions will be provided immediately following surgery by your doctor.  Have these filled as soon as possible after surgery and take the medication as directed. 14. Remove nail polish on the operative foot. 15. Wash the night before surgery.  The night before surgery wash the foot and leg well with the antibacterial soap provided and water paying special attention to beneath the toenails and in between the toes.  Rinse thoroughly with water and dry well with a towel.  Perform this wash unless told not to do so by your physician.  Enclosed: 1 Ice pack (please put in freezer the night before surgery)   1 Hibiclens skin cleaner   Pre-op Instructions  If you have any questions regarding the instructions, do not hesitate to call our office.  [place]: [address] [place], [place] [address] [phone]  [place]: [address] [place], [place] [address] [phone]  [place]: [address]  [place], [place] [address] [phone]   Dr.   Norman Regal DPM, Dr. Matthew Wagoner DPM, Dr. M. Todd Hyatt DPM, Dr. Titorya Stover DPM 

## 2016-06-11 NOTE — Progress Notes (Signed)
Subjective:     Patient ID: [name], female   DOB: June 25, 1971, 45 y.o.   MRN: [id number]  HPI patient presents stating the left foot still doing well and she's ready to the right foot fixed in 2 weeks and she has walked some on the foot without any form of immobilization and has had some discomfort around the first metatarsal   Review of Systems     Objective:   Physical Exam Neurovascular status negative Homans sign was noted with mild noncompliance with walking with out any protection. There is good range of motion with slight discomfort in the first MPJ and mild edema left forefoot with wound edges well coapted first and fifth metatarsal and only mild discomfort noted    Assessment:     Doing well post osteotomy left with noncompliance which may have affected osteotomy    Plan:     H&P and x-ray left foot reviewed. I then allowed her to read consent form for right foot concerning first and fifth metatarsal going over all complications and alternative treatments. For the left foot I did discuss with her that is been slight movement of the osteotomy but I think it's stable but the one pin has moved at in a slightly increased direction and will probably need to be removed. She wants to have this done and at the same time as we did the other surgery we'll remove the proximal pin from the left first metatarsal  X-ray report indicates that there is slight movement of the dorsal segment left first metatarsal but it should be stable and the pin has moved in a more distal direction and may be slightly irritating the joint surface and should be removed with the distal pin looking fine and holding the osteotomy in place

## 2016-06-15 NOTE — Progress Notes (Signed)
DOS 11.21.2017 [place] Bunionectomy (cutting and moving bone) with Pin Fixation Left; Met Osteotomy with Screw 5th Left

## 2016-06-19 DIAGNOSIS — M25512 Pain in left shoulder: Secondary | ICD-10-CM | POA: Diagnosis not present

## 2016-06-19 DIAGNOSIS — M5412 Radiculopathy, cervical region: Secondary | ICD-10-CM | POA: Diagnosis not present

## 2016-06-19 DIAGNOSIS — G894 Chronic pain syndrome: Secondary | ICD-10-CM | POA: Diagnosis not present

## 2016-06-19 DIAGNOSIS — M79602 Pain in left arm: Secondary | ICD-10-CM | POA: Diagnosis not present

## 2016-06-19 DIAGNOSIS — Z79891 Long term (current) use of opiate analgesic: Secondary | ICD-10-CM | POA: Diagnosis not present

## 2016-06-23 ENCOUNTER — Encounter: Payer: Self-pay | Admitting: Podiatry

## 2016-06-23 DIAGNOSIS — Z472 Encounter for removal of internal fixation device: Secondary | ICD-10-CM | POA: Diagnosis not present

## 2016-06-23 DIAGNOSIS — Z4889 Encounter for other specified surgical aftercare: Secondary | ICD-10-CM | POA: Diagnosis not present

## 2016-06-23 DIAGNOSIS — M21541 Acquired clubfoot, right foot: Secondary | ICD-10-CM | POA: Diagnosis not present

## 2016-06-23 DIAGNOSIS — M21621 Bunionette of right foot: Secondary | ICD-10-CM | POA: Diagnosis not present

## 2016-06-23 DIAGNOSIS — M2011 Hallux valgus (acquired), right foot: Secondary | ICD-10-CM | POA: Diagnosis not present

## 2016-06-23 DIAGNOSIS — Z01818 Encounter for other preprocedural examination: Secondary | ICD-10-CM | POA: Diagnosis not present

## 2016-06-23 DIAGNOSIS — M2010 Hallux valgus (acquired), unspecified foot: Secondary | ICD-10-CM | POA: Diagnosis not present

## 2016-06-24 ENCOUNTER — Telehealth: Payer: Self-pay

## 2016-06-24 NOTE — Telephone Encounter (Signed)
Spoke with pt regarding post op status, she states that her foot is doing well, with mild throbbing on the lateral side.Advised her on loosening her ace bandage, leaving her gauze in place. Any acute symptom changes need to be reported.

## 2016-06-30 ENCOUNTER — Ambulatory Visit (INDEPENDENT_AMBULATORY_CARE_PROVIDER_SITE_OTHER): Payer: BLUE CROSS/BLUE SHIELD | Admitting: Podiatry

## 2016-06-30 ENCOUNTER — Encounter: Payer: Self-pay | Admitting: Podiatry

## 2016-06-30 ENCOUNTER — Ambulatory Visit (INDEPENDENT_AMBULATORY_CARE_PROVIDER_SITE_OTHER): Payer: BLUE CROSS/BLUE SHIELD

## 2016-06-30 VITALS — Temp 96.5°F

## 2016-06-30 DIAGNOSIS — Z9889 Other specified postprocedural states: Secondary | ICD-10-CM

## 2016-06-30 DIAGNOSIS — M21622 Bunionette of left foot: Secondary | ICD-10-CM

## 2016-07-06 ENCOUNTER — Encounter: Payer: Self-pay | Admitting: Podiatry

## 2016-07-06 ENCOUNTER — Ambulatory Visit: Payer: BLUE CROSS/BLUE SHIELD

## 2016-07-06 ENCOUNTER — Ambulatory Visit (INDEPENDENT_AMBULATORY_CARE_PROVIDER_SITE_OTHER): Payer: BLUE CROSS/BLUE SHIELD

## 2016-07-06 ENCOUNTER — Ambulatory Visit (INDEPENDENT_AMBULATORY_CARE_PROVIDER_SITE_OTHER): Payer: BLUE CROSS/BLUE SHIELD | Admitting: Podiatry

## 2016-07-06 VITALS — BP 115/80 | HR 106 | Resp 16

## 2016-07-06 DIAGNOSIS — M21619 Bunion of unspecified foot: Secondary | ICD-10-CM

## 2016-07-06 DIAGNOSIS — M21622 Bunionette of left foot: Secondary | ICD-10-CM

## 2016-07-06 DIAGNOSIS — Z9889 Other specified postprocedural states: Secondary | ICD-10-CM

## 2016-07-06 NOTE — Progress Notes (Signed)
Subjective:     Patient ID: [name], female   DOB: 1970-12-09, 46 y.o.   MRN: [id number]  HPI patient states I'm doing well with both feet with mild edema that overall doing well with wound edges well coapted and seems like this puts doing even better than the first   Review of Systems     Objective:   Physical Exam Neurovascular status intact negative Homans sign was noted with wound edges well coapted bilateral with mild/moderate edema still noted bilateral but good range of motion of the first MPJ bilateral with wound edges well coapted    Assessment:     Doing well from structural bunion and tailor bunion correction bilateral with excellent range of motion and no indications of pathology    Plan:     Advised this patient on anti-inflammatories physical therapy and wider shoe gear and may gradually begin wearing soft shoe on the left foot. Patient will be seen back to recheck  X-ray report indicates osteotomy is healing well. There is movement of the fifth metatarsal right over left but I do believe this will heal in by secondary intent and I did explain the patient the possibility that someday this may need to be re-fixated but I feel very strongly it'll heal with continued immobilization

## 2016-07-09 ENCOUNTER — Ambulatory Visit (INDEPENDENT_AMBULATORY_CARE_PROVIDER_SITE_OTHER): Payer: BLUE CROSS/BLUE SHIELD

## 2016-07-09 ENCOUNTER — Ambulatory Visit (INDEPENDENT_AMBULATORY_CARE_PROVIDER_SITE_OTHER): Payer: Self-pay | Admitting: Podiatry

## 2016-07-09 ENCOUNTER — Encounter: Payer: Self-pay | Admitting: Podiatry

## 2016-07-09 VITALS — BP 108/72 | HR 99 | Resp 16

## 2016-07-09 DIAGNOSIS — S99921A Unspecified injury of right foot, initial encounter: Secondary | ICD-10-CM

## 2016-07-09 DIAGNOSIS — Z9889 Other specified postprocedural states: Secondary | ICD-10-CM

## 2016-07-12 NOTE — Progress Notes (Signed)
Subjective:     Patient ID: [name], female   DOB: 1971-05-24, 46 y.o.   MRN: [id number]  HPI patient presents with pain in the right foot and admits she traumatized her foot and walked and rolled over the right side outside without immobilization   Review of Systems     Objective:   Physical Exam Neurovascular status intact muscle strength adequate with well-healing surgical site right with wound edges well coapted and no drainage and no excessive edema with negative Homans sign noted    Assessment:     Probable trauma to the right lateral foot where surgery was performed    Plan:     X-ray reviewed and I discussed that there is some destabilization of the bone that we have known about but I am still quite confident that will heal at this position with no significant changes previous visit. I did discuss with her the consideration of realigning the bone with change in fixation but at this point she wants to continue with immobilization and we will continue to monitor and if it does not heal that may be necessary at one point in future  X-ray report indicates that the positional component of the fifth metatarsal head remains stable and should heal with secondary intent

## 2016-07-17 ENCOUNTER — Ambulatory Visit (INDEPENDENT_AMBULATORY_CARE_PROVIDER_SITE_OTHER): Payer: Self-pay | Admitting: Podiatry

## 2016-07-17 ENCOUNTER — Ambulatory Visit (INDEPENDENT_AMBULATORY_CARE_PROVIDER_SITE_OTHER): Payer: BLUE CROSS/BLUE SHIELD

## 2016-07-17 DIAGNOSIS — Z9889 Other specified postprocedural states: Secondary | ICD-10-CM

## 2016-07-17 DIAGNOSIS — M21619 Bunion of unspecified foot: Secondary | ICD-10-CM | POA: Diagnosis not present

## 2016-07-17 DIAGNOSIS — M21622 Bunionette of left foot: Secondary | ICD-10-CM

## 2016-07-19 NOTE — Progress Notes (Signed)
Subjective:     Patient ID: [name], female   DOB: 1971-06-12, 46 y.o.   MRN: [id number]  HPI patient states that she no she traumatized her right foot and she just wanted to make sure it was healing okay with mild reduction of discomfort and swelling but still present. States her left foot is doing well   Review of Systems     Objective:   Physical Exam Neurovascular status intact negative Homans sign was noted with patient's right foot wound edges well coapted with mild edema in the medial lateral foot consistent with surgery with no other indications of changes and condition. Left foot is doing well with good structural alignment    Assessment:     Appears to be on course but still concerned due to trauma    Plan:     Precautionary x-ray taken and reviewed that there still is quite a bit healing to go but I do believe it will heal by secondary intent. We will continue to watch it closely she will continue to immobilize and will be seen back in 2 weeks  X-rays indicate no significant changes from previous visit and it does appear that gradual secondary bone healing is starting to occur but we'll know better as we get to the next few weeks. Again discussed with her the possibility for re-fixation of this if it does not heal completely

## 2016-07-20 ENCOUNTER — Ambulatory Visit: Payer: BLUE CROSS/BLUE SHIELD

## 2016-07-21 DIAGNOSIS — G894 Chronic pain syndrome: Secondary | ICD-10-CM | POA: Diagnosis not present

## 2016-07-21 DIAGNOSIS — Z79891 Long term (current) use of opiate analgesic: Secondary | ICD-10-CM | POA: Diagnosis not present

## 2016-07-21 DIAGNOSIS — M542 Cervicalgia: Secondary | ICD-10-CM | POA: Diagnosis not present

## 2016-07-21 DIAGNOSIS — M546 Pain in thoracic spine: Secondary | ICD-10-CM | POA: Diagnosis not present

## 2016-07-21 DIAGNOSIS — M545 Low back pain: Secondary | ICD-10-CM | POA: Diagnosis not present

## 2016-07-28 ENCOUNTER — Encounter: Payer: Self-pay | Admitting: Podiatry

## 2016-07-31 ENCOUNTER — Ambulatory Visit (INDEPENDENT_AMBULATORY_CARE_PROVIDER_SITE_OTHER): Payer: Self-pay | Admitting: Podiatry

## 2016-07-31 ENCOUNTER — Ambulatory Visit (INDEPENDENT_AMBULATORY_CARE_PROVIDER_SITE_OTHER): Payer: BLUE CROSS/BLUE SHIELD

## 2016-07-31 DIAGNOSIS — M21619 Bunion of unspecified foot: Secondary | ICD-10-CM

## 2016-07-31 DIAGNOSIS — Z9889 Other specified postprocedural states: Secondary | ICD-10-CM

## 2016-07-31 DIAGNOSIS — M21622 Bunionette of left foot: Secondary | ICD-10-CM

## 2016-08-01 NOTE — Progress Notes (Signed)
Subjective:     Patient ID: [name], female   DOB: 10-Nov-1970, 46 y.o.   MRN: [id number]  HPI patient states she traumatized her right foot again and she wanted to make sure that she didn't do anything worse   Review of Systems     Objective:   Physical Exam Neurovascular status intact with patient is had numerous injuries right and left foot and clinically is healing well with wound edges well coapted good range of motion first MPJ and minimal discomfort fifth metatarsal right    Assessment:     Noncompliant patient who is been very traumatized on both feet    Plan:     X-rays taken and will continue with present course of immobilization and gradually increase in activity over the next month. Will be seen back at that time or earlier if needed  X-rays indicate there has been movement of the fifth metatarsal right but there is signs of secondary bone healing and it should heal uneventfully this position over time with first metatarsals healing well

## 2016-08-03 DIAGNOSIS — R51 Headache: Secondary | ICD-10-CM | POA: Diagnosis not present

## 2016-08-03 DIAGNOSIS — R42 Dizziness and giddiness: Secondary | ICD-10-CM | POA: Diagnosis not present

## 2016-08-03 DIAGNOSIS — R635 Abnormal weight gain: Secondary | ICD-10-CM | POA: Diagnosis not present

## 2016-08-05 NOTE — Progress Notes (Signed)
DOS 12.26.2017 [place] bunionectomy with pin 1st right (cutting and moving bone.) 5th metatarsal osteotomy with screw 5th right. Removal prox. pin 1st left.

## 2016-08-11 DIAGNOSIS — Z0000 Encounter for general adult medical examination without abnormal findings: Secondary | ICD-10-CM | POA: Diagnosis not present

## 2016-08-12 DIAGNOSIS — Z0000 Encounter for general adult medical examination without abnormal findings: Secondary | ICD-10-CM | POA: Diagnosis not present

## 2016-08-13 ENCOUNTER — Other Ambulatory Visit: Payer: Self-pay | Admitting: Family Medicine

## 2016-08-13 DIAGNOSIS — R519 Headache, unspecified: Secondary | ICD-10-CM

## 2016-08-13 DIAGNOSIS — R51 Headache: Principal | ICD-10-CM

## 2016-08-14 ENCOUNTER — Ambulatory Visit
Admission: RE | Admit: 2016-08-14 | Discharge: 2016-08-14 | Disposition: A | Discharge status: Home / Self Care | Payer: BLUE CROSS/BLUE SHIELD | Source: Ambulatory Visit | Attending: Family Medicine | Admitting: Family Medicine

## 2016-08-14 DIAGNOSIS — R51 Headache: Secondary | ICD-10-CM | POA: Diagnosis not present

## 2016-08-14 DIAGNOSIS — R519 Headache, unspecified: Secondary | ICD-10-CM

## 2016-08-17 DIAGNOSIS — M5416 Radiculopathy, lumbar region: Secondary | ICD-10-CM | POA: Diagnosis not present

## 2016-08-17 DIAGNOSIS — R42 Dizziness and giddiness: Secondary | ICD-10-CM | POA: Diagnosis not present

## 2016-08-19 DIAGNOSIS — Z79891 Long term (current) use of opiate analgesic: Secondary | ICD-10-CM | POA: Diagnosis not present

## 2016-08-19 DIAGNOSIS — G894 Chronic pain syndrome: Secondary | ICD-10-CM | POA: Diagnosis not present

## 2016-08-19 DIAGNOSIS — M79661 Pain in right lower leg: Secondary | ICD-10-CM | POA: Diagnosis not present

## 2016-08-19 DIAGNOSIS — M79604 Pain in right leg: Secondary | ICD-10-CM | POA: Diagnosis not present

## 2016-08-19 DIAGNOSIS — M25551 Pain in right hip: Secondary | ICD-10-CM | POA: Diagnosis not present

## 2016-08-21 DIAGNOSIS — M5410 Radiculopathy, site unspecified: Secondary | ICD-10-CM | POA: Diagnosis not present

## 2016-08-24 DIAGNOSIS — H9041 Sensorineural hearing loss, unilateral, right ear, with unrestricted hearing on the contralateral side: Secondary | ICD-10-CM | POA: Diagnosis not present

## 2016-08-24 DIAGNOSIS — R42 Dizziness and giddiness: Secondary | ICD-10-CM | POA: Diagnosis not present

## 2016-08-26 DIAGNOSIS — M5410 Radiculopathy, site unspecified: Secondary | ICD-10-CM | POA: Diagnosis not present

## 2016-08-26 DIAGNOSIS — M5127 Other intervertebral disc displacement, lumbosacral region: Secondary | ICD-10-CM | POA: Diagnosis not present

## 2016-08-31 ENCOUNTER — Other Ambulatory Visit: Payer: Self-pay | Admitting: Neurological Surgery

## 2016-08-31 DIAGNOSIS — M5126 Other intervertebral disc displacement, lumbar region: Secondary | ICD-10-CM | POA: Diagnosis not present

## 2016-09-01 ENCOUNTER — Encounter (HOSPITAL_COMMUNITY): Payer: Self-pay

## 2016-09-01 NOTE — Progress Notes (Signed)
Call to [facility] for preop orders.

## 2016-09-01 NOTE — Progress Notes (Signed)
Pt. Followed by Dr. Cristi Loron, PCP & [facility] whom gave cardiac clearance for Bunionectomy in the past month.  Faxed request to [facility] for cardiac records. Pt. 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

## 2016-09-01 NOTE — Progress Notes (Signed)
Pt. Reporting that since the ablation for WPW, no new problems or complaints but she is followed by [facility].  Call to A. Kabbe,NP, reported same. ECHO report found under Media tab, 2012.  Pt. Very anxious about IV insertion, she reports that she has good access but has N&V if she is approached with IV to her hand. She ask that the IV be placed in her antecubital place.  Pt. reassured.

## 2016-09-02 ENCOUNTER — Encounter (HOSPITAL_COMMUNITY)
Admission: RE | Disposition: A | Discharge status: Home / Self Care | Payer: Self-pay | Source: Ambulatory Visit | Attending: Neurological Surgery

## 2016-09-02 ENCOUNTER — Observation Stay (HOSPITAL_COMMUNITY)
Admission: RE | Admit: 2016-09-02 | Discharge: 2016-09-02 | Disposition: A | Discharge status: Home / Self Care | Payer: BLUE CROSS/BLUE SHIELD | Source: Ambulatory Visit | Attending: Neurological Surgery | Admitting: Neurological Surgery

## 2016-09-02 ENCOUNTER — Observation Stay (HOSPITAL_COMMUNITY): Payer: BLUE CROSS/BLUE SHIELD

## 2016-09-02 ENCOUNTER — Ambulatory Visit (HOSPITAL_COMMUNITY): Payer: BLUE CROSS/BLUE SHIELD | Admitting: Emergency Medicine

## 2016-09-02 ENCOUNTER — Encounter (HOSPITAL_COMMUNITY): Payer: Self-pay | Admitting: Neurological Surgery

## 2016-09-02 DIAGNOSIS — M5127 Other intervertebral disc displacement, lumbosacral region: Secondary | ICD-10-CM | POA: Diagnosis not present

## 2016-09-02 DIAGNOSIS — M5126 Other intervertebral disc displacement, lumbar region: Secondary | ICD-10-CM | POA: Diagnosis not present

## 2016-09-02 DIAGNOSIS — Z7951 Long term (current) use of inhaled steroids: Secondary | ICD-10-CM | POA: Diagnosis not present

## 2016-09-02 DIAGNOSIS — K219 Gastro-esophageal reflux disease without esophagitis: Secondary | ICD-10-CM | POA: Insufficient documentation

## 2016-09-02 DIAGNOSIS — Z9889 Other specified postprocedural states: Secondary | ICD-10-CM

## 2016-09-02 DIAGNOSIS — M79604 Pain in right leg: Secondary | ICD-10-CM | POA: Diagnosis present

## 2016-09-02 DIAGNOSIS — F17210 Nicotine dependence, cigarettes, uncomplicated: Secondary | ICD-10-CM | POA: Diagnosis not present

## 2016-09-02 DIAGNOSIS — Z79899 Other long term (current) drug therapy: Secondary | ICD-10-CM | POA: Insufficient documentation

## 2016-09-02 DIAGNOSIS — G4730 Sleep apnea, unspecified: Secondary | ICD-10-CM | POA: Insufficient documentation

## 2016-09-02 DIAGNOSIS — F329 Major depressive disorder, single episode, unspecified: Secondary | ICD-10-CM | POA: Diagnosis not present

## 2016-09-02 DIAGNOSIS — Z791 Long term (current) use of non-steroidal anti-inflammatories (NSAID): Secondary | ICD-10-CM | POA: Insufficient documentation

## 2016-09-02 DIAGNOSIS — E039 Hypothyroidism, unspecified: Secondary | ICD-10-CM | POA: Insufficient documentation

## 2016-09-02 DIAGNOSIS — M549 Dorsalgia, unspecified: Secondary | ICD-10-CM | POA: Diagnosis not present

## 2016-09-02 DIAGNOSIS — Z419 Encounter for procedure for purposes other than remedying health state, unspecified: Secondary | ICD-10-CM

## 2016-09-02 HISTORY — DX: Hypothyroidism, unspecified: E03.9

## 2016-09-02 HISTORY — DX: Other specified postprocedural states: Z98.890

## 2016-09-02 HISTORY — DX: Sleep apnea, unspecified: G47.30

## 2016-09-02 HISTORY — DX: Personal history of urinary calculi: Z87.442

## 2016-09-02 HISTORY — DX: Myoneural disorder, unspecified: G70.9

## 2016-09-02 HISTORY — PX: LUMBAR LAMINECTOMY/DECOMPRESSION MICRODISCECTOMY: SHX5026

## 2016-09-02 LAB — BASIC METABOLIC PANEL
ANION GAP: 8 (ref 5–15)
BUN: 8 mg/dL (ref 6–20)
CALCIUM: 9.5 mg/dL (ref 8.9–10.3)
CO2: 25 mmol/L (ref 22–32)
CREATININE: 0.89 mg/dL (ref 0.44–1.00)
Chloride: 107 mmol/L (ref 101–111)
Glucose, Bld: 99 mg/dL (ref 65–99)
Potassium: 3.5 mmol/L (ref 3.5–5.1)
Sodium: 140 mmol/L (ref 135–145)

## 2016-09-02 LAB — CBC
HCT: 46.6 % — ABNORMAL HIGH (ref 36.0–46.0)
Hemoglobin: 15.2 g/dL — ABNORMAL HIGH (ref 12.0–15.0)
MCH: 27.7 pg (ref 26.0–34.0)
MCHC: 32.6 g/dL (ref 30.0–36.0)
MCV: 85 fL (ref 78.0–100.0)
PLATELETS: 255 10*3/uL (ref 150–400)
RBC: 5.48 MIL/uL — AB (ref 3.87–5.11)
RDW: 13.2 % (ref 11.5–15.5)
WBC: 9.4 10*3/uL (ref 4.0–10.5)

## 2016-09-02 LAB — SURGICAL PCR SCREEN
MRSA, PCR: NEGATIVE
STAPHYLOCOCCUS AUREUS: POSITIVE — AB

## 2016-09-02 LAB — HCG, SERUM, QUALITATIVE: PREG SERUM: NEGATIVE

## 2016-09-02 SURGERY — LUMBAR LAMINECTOMY/DECOMPRESSION MICRODISCECTOMY 1 LEVEL
Anesthesia: General | Site: Back | Laterality: Right

## 2016-09-02 MED ORDER — MELOXICAM 15 MG PO TABS
15.0000 mg | ORAL_TABLET | Freq: Every day | ORAL | Status: DC
  Filled 2016-09-02: qty 1

## 2016-09-02 MED ORDER — FENTANYL CITRATE (PF) 100 MCG/2ML IJ SOLN
INTRAMUSCULAR | Status: AC
  Filled 2016-09-02: qty 4

## 2016-09-02 MED ORDER — METHOCARBAMOL 1000 MG/10ML IJ SOLN
500.0000 mg | Freq: Four times a day (QID) | INTRAMUSCULAR | Status: DC | PRN
  Filled 2016-09-02: qty 5

## 2016-09-02 MED ORDER — THROMBIN 5000 UNITS EX SOLR
CUTANEOUS | Status: DC | PRN
  Administered 2016-09-02 (×2): 5000 [IU] via TOPICAL

## 2016-09-02 MED ORDER — SUGAMMADEX SODIUM 200 MG/2ML IV SOLN
INTRAVENOUS | Status: DC | PRN
  Administered 2016-09-02: 180 mg via INTRAVENOUS

## 2016-09-02 MED ORDER — MORPHINE SULFATE (PF) 4 MG/ML IV SOLN: 1.0000 mg | INTRAVENOUS | Status: DC | PRN

## 2016-09-02 MED ORDER — SODIUM CHLORIDE 0.9 % IR SOLN
Status: DC | PRN
  Administered 2016-09-02: 12:00:00

## 2016-09-02 MED ORDER — BUPIVACAINE HCL (PF) 0.25 % IJ SOLN
INTRAMUSCULAR | Status: DC | PRN
Start: 2016-09-02 — End: 2016-09-02
  Administered 2016-09-02: 10 mL

## 2016-09-02 MED ORDER — CHLORHEXIDINE GLUCONATE CLOTH 2 % EX PADS: 6.0000 | MEDICATED_PAD | Freq: Once | CUTANEOUS | Status: DC

## 2016-09-02 MED ORDER — MIDAZOLAM HCL 5 MG/5ML IJ SOLN
INTRAMUSCULAR | Status: DC | PRN
  Administered 2016-09-02: 2 mg via INTRAVENOUS

## 2016-09-02 MED ORDER — PROMETHAZINE HCL 25 MG/ML IJ SOLN: 6.2500 mg | INTRAMUSCULAR | Status: DC | PRN

## 2016-09-02 MED ORDER — DOXEPIN HCL 50 MG PO CAPS
50.0000 mg | ORAL_CAPSULE | Freq: Every day | ORAL | Status: DC
  Filled 2016-09-02: qty 1

## 2016-09-02 MED ORDER — MIDAZOLAM HCL 2 MG/2ML IJ SOLN
INTRAMUSCULAR | Status: AC
  Filled 2016-09-02: qty 2

## 2016-09-02 MED ORDER — THROMBIN 5000 UNITS EX SOLR
CUTANEOUS | Status: AC
  Filled 2016-09-02: qty 15000

## 2016-09-02 MED ORDER — ONDANSETRON HCL 4 MG/2ML IJ SOLN
INTRAMUSCULAR | Status: DC | PRN
  Administered 2016-09-02: 4 mg via INTRAVENOUS

## 2016-09-02 MED ORDER — SENNA 8.6 MG PO TABS: 1.0000 | ORAL_TABLET | Freq: Two times a day (BID) | ORAL | Status: DC

## 2016-09-02 MED ORDER — MIDAZOLAM HCL 2 MG/2ML IJ SOLN
INTRAMUSCULAR | Status: AC
  Administered 2016-09-02 (×2): 1 mg
  Filled 2016-09-02: qty 2

## 2016-09-02 MED ORDER — PROPOFOL 10 MG/ML IV BOLUS
INTRAVENOUS | Status: DC | PRN
  Administered 2016-09-02: 160 mg via INTRAVENOUS

## 2016-09-02 MED ORDER — TOPIRAMATE 25 MG PO TABS
50.0000 mg | ORAL_TABLET | Freq: Every day | ORAL | Status: DC
  Filled 2016-09-02: qty 2

## 2016-09-02 MED ORDER — CEFAZOLIN SODIUM-DEXTROSE 2-4 GM/100ML-% IV SOLN
2.0000 g | INTRAVENOUS | Status: AC
  Administered 2016-09-02: 2 g via INTRAVENOUS
  Filled 2016-09-02: qty 100

## 2016-09-02 MED ORDER — CLONAZEPAM 0.5 MG PO TABS: 2.0000 mg | ORAL_TABLET | Freq: Every day | ORAL | Status: DC

## 2016-09-02 MED ORDER — MEPERIDINE HCL 25 MG/ML IJ SOLN: 6.2500 mg | INTRAMUSCULAR | Status: DC | PRN

## 2016-09-02 MED ORDER — OXYCODONE HCL 5 MG PO TABS
30.0000 mg | ORAL_TABLET | Freq: Every day | ORAL | Status: DC
  Administered 2016-09-02: 30 mg via ORAL
  Filled 2016-09-02: qty 6

## 2016-09-02 MED ORDER — ONDANSETRON HCL 4 MG PO TABS: 4.0000 mg | ORAL_TABLET | Freq: Four times a day (QID) | ORAL | Status: DC | PRN

## 2016-09-02 MED ORDER — PROPOFOL 10 MG/ML IV BOLUS
INTRAVENOUS | Status: AC
  Filled 2016-09-02: qty 40

## 2016-09-02 MED ORDER — ARIPIPRAZOLE 5 MG PO TABS: 5.0000 mg | ORAL_TABLET | Freq: Every day | ORAL | Status: DC

## 2016-09-02 MED ORDER — VARENICLINE TARTRATE 1 MG PO TABS
1.0000 mg | ORAL_TABLET | Freq: Two times a day (BID) | ORAL | Status: DC
  Filled 2016-09-02: qty 1

## 2016-09-02 MED ORDER — PHENYLEPHRINE 40 MCG/ML (10ML) SYRINGE FOR IV PUSH (FOR BLOOD PRESSURE SUPPORT)
PREFILLED_SYRINGE | INTRAVENOUS | Status: AC
  Filled 2016-09-02: qty 10

## 2016-09-02 MED ORDER — MENTHOL 3 MG MT LOZG: 1.0000 | LOZENGE | OROMUCOSAL | Status: DC | PRN

## 2016-09-02 MED ORDER — LIDOCAINE 2% (20 MG/ML) 5 ML SYRINGE
INTRAMUSCULAR | Status: DC | PRN
  Administered 2016-09-02: 80 mg via INTRAVENOUS

## 2016-09-02 MED ORDER — OXYCODONE HCL 5 MG/5ML PO SOLN: 5.0000 mg | Freq: Once | ORAL | Status: DC | PRN

## 2016-09-02 MED ORDER — OXYCODONE HCL 5 MG PO TABS: 5.0000 mg | ORAL_TABLET | Freq: Once | ORAL | Status: DC | PRN

## 2016-09-02 MED ORDER — BUPROPION HCL ER (XL) 150 MG PO TB24
150.0000 mg | ORAL_TABLET | Freq: Every day | ORAL | Status: DC
  Filled 2016-09-02: qty 1

## 2016-09-02 MED ORDER — ACETAMINOPHEN 650 MG RE SUPP: 650.0000 mg | RECTAL | Status: DC | PRN

## 2016-09-02 MED ORDER — CEFAZOLIN SODIUM-DEXTROSE 2-4 GM/100ML-% IV SOLN: 2.0000 g | Freq: Three times a day (TID) | INTRAVENOUS | Status: DC

## 2016-09-02 MED ORDER — SUCCINYLCHOLINE CHLORIDE 200 MG/10ML IV SOSY
PREFILLED_SYRINGE | INTRAVENOUS | Status: AC
  Filled 2016-09-02: qty 10

## 2016-09-02 MED ORDER — FENTANYL CITRATE (PF) 100 MCG/2ML IJ SOLN
INTRAMUSCULAR | Status: AC
  Filled 2016-09-02: qty 2

## 2016-09-02 MED ORDER — FENTANYL CITRATE (PF) 100 MCG/2ML IJ SOLN
25.0000 ug | INTRAMUSCULAR | Status: DC | PRN
  Administered 2016-09-02: 50 ug via INTRAVENOUS

## 2016-09-02 MED ORDER — AMPHETAMINE-DEXTROAMPHETAMINE 10 MG PO TABS: 20.0000 mg | ORAL_TABLET | Freq: Two times a day (BID) | ORAL | Status: DC

## 2016-09-02 MED ORDER — ONDANSETRON HCL 4 MG/2ML IJ SOLN
INTRAMUSCULAR | Status: AC
  Filled 2016-09-02: qty 2

## 2016-09-02 MED ORDER — LACTATED RINGERS IV SOLN
INTRAVENOUS | Status: DC
  Administered 2016-09-02: 50 mL/h via INTRAVENOUS
  Administered 2016-09-02: 13:00:00 via INTRAVENOUS

## 2016-09-02 MED ORDER — SODIUM CHLORIDE 0.9% FLUSH: 3.0000 mL | INTRAVENOUS | Status: DC | PRN

## 2016-09-02 MED ORDER — 0.9 % SODIUM CHLORIDE (POUR BTL) OPTIME
TOPICAL | Status: DC | PRN
  Administered 2016-09-02: 1000 mL

## 2016-09-02 MED ORDER — PHENOL 1.4 % MT LIQD: 1.0000 | OROMUCOSAL | Status: DC | PRN

## 2016-09-02 MED ORDER — ROCURONIUM BROMIDE 10 MG/ML (PF) SYRINGE
PREFILLED_SYRINGE | INTRAVENOUS | Status: DC | PRN
  Administered 2016-09-02: 50 mg via INTRAVENOUS

## 2016-09-02 MED ORDER — METHOCARBAMOL 500 MG PO TABS: 500.0000 mg | ORAL_TABLET | Freq: Four times a day (QID) | ORAL | Status: DC | PRN

## 2016-09-02 MED ORDER — BUPIVACAINE HCL (PF) 0.25 % IJ SOLN
INTRAMUSCULAR | Status: AC
  Filled 2016-09-02: qty 30

## 2016-09-02 MED ORDER — MUPIROCIN 2 % EX OINT
1.0000 "application " | TOPICAL_OINTMENT | Freq: Once | CUTANEOUS | Status: AC
  Administered 2016-09-02: 1 via TOPICAL
  Filled 2016-09-02: qty 22

## 2016-09-02 MED ORDER — LEVOTHYROXINE SODIUM 100 MCG PO TABS: 150.0000 ug | ORAL_TABLET | Freq: Every day | ORAL | Status: DC

## 2016-09-02 MED ORDER — FENTANYL CITRATE (PF) 100 MCG/2ML IJ SOLN
INTRAMUSCULAR | Status: DC | PRN
  Administered 2016-09-02: 100 ug via INTRAVENOUS
  Administered 2016-09-02 (×4): 50 ug via INTRAVENOUS

## 2016-09-02 MED ORDER — VILAZODONE HCL 40 MG PO TABS: 40.0000 mg | ORAL_TABLET | Freq: Every day | ORAL | Status: DC

## 2016-09-02 MED ORDER — LIDOCAINE 2% (20 MG/ML) 5 ML SYRINGE
INTRAMUSCULAR | Status: AC
  Filled 2016-09-02: qty 5

## 2016-09-02 MED ORDER — ONDANSETRON HCL 4 MG/2ML IJ SOLN: 4.0000 mg | Freq: Four times a day (QID) | INTRAMUSCULAR | Status: DC | PRN

## 2016-09-02 MED ORDER — POTASSIUM CHLORIDE IN NACL 20-0.9 MEQ/L-% IV SOLN
INTRAVENOUS | Status: DC
  Administered 2016-09-02: 15:00:00 via INTRAVENOUS

## 2016-09-02 MED ORDER — DULOXETINE HCL 30 MG PO CPEP: 60.0000 mg | ORAL_CAPSULE | Freq: Every day | ORAL | Status: DC

## 2016-09-02 MED ORDER — HEMOSTATIC AGENTS (NO CHARGE) OPTIME
TOPICAL | Status: DC | PRN
  Administered 2016-09-02: 1 via TOPICAL

## 2016-09-02 MED ORDER — ACETAMINOPHEN 325 MG PO TABS: 650.0000 mg | ORAL_TABLET | ORAL | Status: DC | PRN

## 2016-09-02 MED ORDER — GELATIN ABSORBABLE MT POWD
OROMUCOSAL | Status: DC | PRN
Start: 2016-09-02 — End: 2016-09-02
  Administered 2016-09-02: 12:00:00 via TOPICAL

## 2016-09-02 MED ORDER — EPHEDRINE 5 MG/ML INJ
INTRAVENOUS | Status: AC
  Filled 2016-09-02: qty 10

## 2016-09-02 MED ORDER — SODIUM CHLORIDE 0.9% FLUSH: 3.0000 mL | Freq: Two times a day (BID) | INTRAVENOUS | Status: DC

## 2016-09-02 SURGICAL SUPPLY — 45 items
APL SKNCLS STERI-STRIP NONHPOA (GAUZE/BANDAGES/DRESSINGS) ×1
BAG DECANTER FOR FLEXI CONT (MISCELLANEOUS) ×2 IMPLANT
BENZOIN TINCTURE PRP APPL 2/3 (GAUZE/BANDAGES/DRESSINGS) ×2 IMPLANT
BUR MATCHSTICK NEURO 3.0 LAGG (BURR) ×2 IMPLANT
CANISTER SUCT 3000ML PPV (MISCELLANEOUS) ×2 IMPLANT
CARTRIDGE OIL MAESTRO DRILL (MISCELLANEOUS) ×1 IMPLANT
DIFFUSER DRILL AIR PNEUMATIC (MISCELLANEOUS) ×2 IMPLANT
DRAPE LAPAROTOMY 100X72X124 (DRAPES) ×2 IMPLANT
DRAPE MICROSCOPE LEICA (MISCELLANEOUS) ×2 IMPLANT
DRAPE POUCH INSTRU U-SHP 10X18 (DRAPES) ×2 IMPLANT
DRAPE SURG 17X23 STRL (DRAPES) ×2 IMPLANT
DRSG OPSITE POSTOP 4X6 (GAUZE/BANDAGES/DRESSINGS) ×1 IMPLANT
DURAPREP 26ML APPLICATOR (WOUND CARE) ×2 IMPLANT
ELECT REM PT RETURN 9FT ADLT (ELECTROSURGICAL) ×2
ELECTRODE REM PT RTRN 9FT ADLT (ELECTROSURGICAL) ×1 IMPLANT
GAUZE SPONGE 4X4 16PLY XRAY LF (GAUZE/BANDAGES/DRESSINGS) IMPLANT
GLOVE BIO SURGEON STRL SZ8 (GLOVE) ×3 IMPLANT
GLOVE BIO SURGEON STRL SZ8.5 (GLOVE) ×1 IMPLANT
GLOVE SURG SS PI 7.0 STRL IVOR (GLOVE) ×3 IMPLANT
GOWN STRL REUS W/ TWL LRG LVL3 (GOWN DISPOSABLE) IMPLANT
GOWN STRL REUS W/ TWL XL LVL3 (GOWN DISPOSABLE) ×1 IMPLANT
GOWN STRL REUS W/TWL 2XL LVL3 (GOWN DISPOSABLE) IMPLANT
GOWN STRL REUS W/TWL LRG LVL3 (GOWN DISPOSABLE) ×2
GOWN STRL REUS W/TWL XL LVL3 (GOWN DISPOSABLE) ×4
HEMOSTAT POWDER KIT SURGIFOAM (HEMOSTASIS) ×1 IMPLANT
KIT BASIN OR (CUSTOM PROCEDURE TRAY) ×2 IMPLANT
KIT ROOM TURNOVER OR (KITS) ×2 IMPLANT
NDL HYPO 25X1 1.5 SAFETY (NEEDLE) ×1 IMPLANT
NDL SPNL 20GX3.5 QUINCKE YW (NEEDLE) IMPLANT
NEEDLE HYPO 25X1 1.5 SAFETY (NEEDLE) ×2 IMPLANT
NEEDLE SPNL 20GX3.5 QUINCKE YW (NEEDLE) ×2 IMPLANT
NS IRRIG 1000ML POUR BTL (IV SOLUTION) ×2 IMPLANT
OIL CARTRIDGE MAESTRO DRILL (MISCELLANEOUS) ×2
PACK LAMINECTOMY NEURO (CUSTOM PROCEDURE TRAY) ×2 IMPLANT
PAD ARMBOARD 7.5X6 YLW CONV (MISCELLANEOUS) ×6 IMPLANT
RUBBERBAND STERILE (MISCELLANEOUS) ×4 IMPLANT
SPONGE SURGIFOAM ABS GEL SZ50 (HEMOSTASIS) ×2 IMPLANT
STRIP CLOSURE SKIN 1/2X4 (GAUZE/BANDAGES/DRESSINGS) ×2 IMPLANT
SUT VIC AB 0 CT1 18XCR BRD8 (SUTURE) ×1 IMPLANT
SUT VIC AB 0 CT1 8-18 (SUTURE) ×2
SUT VIC AB 2-0 CP2 18 (SUTURE) ×2 IMPLANT
SUT VIC AB 3-0 SH 8-18 (SUTURE) ×3 IMPLANT
TOWEL GREEN STERILE (TOWEL DISPOSABLE) ×2 IMPLANT
TOWEL GREEN STERILE FF (TOWEL DISPOSABLE) ×1 IMPLANT
WATER STERILE IRR 1000ML POUR (IV SOLUTION) ×2 IMPLANT

## 2016-09-02 NOTE — Progress Notes (Signed)
Discharged instructions/education/Rx  given to patient   and verbalized understanding. Patient ambulating well, MAE. Tolerated dinner well and voiding with no difficulty. No swelling, no redness, no drainage noted on incision site. Patient waiting for her transport home.

## 2016-09-02 NOTE — Anesthesia Procedure Notes (Signed)
Procedure Name: Intubation Date/Time: 09/02/2016 11:29 AM Performed by: Willeen Cass P Pre-anesthesia Checklist: Patient identified, Emergency Drugs available, Suction available and Patient being monitored Patient Re-evaluated:Patient Re-evaluated prior to inductionOxygen Delivery Method: Circle System Utilized Preoxygenation: Pre-oxygenation with 100% oxygen Intubation Type: IV induction Ventilation: Mask ventilation without difficulty Laryngoscope Size: Mac and 4 Grade View: Grade II Tube type: Oral Tube size: 7.0 mm Number of attempts: 1 Airway Equipment and Method: Stylet Placement Confirmation: ETT inserted through vocal cords under direct vision,  positive ETCO2 and breath sounds checked- equal and bilateral Secured at: 22 cm Tube secured with: Tape Dental Injury: Teeth and Oropharynx as per pre-operative assessment  Comments: Head/neck neutral throughout MV and DL. Positioned to pt comfort prior to induction.

## 2016-09-02 NOTE — H&P (Signed)
Subjective: Patient is a 46 y.o. female admitted for R leg pain. Onset of symptoms was a few weeks ago, gradually worsening since that time.  The pain is rated intense, unremitting, and is located at the across the lower back and radiates to RLE. The pain is described as aching and occurs all day. The symptoms have been progressive. Symptoms are exacerbated by nothing in particular. MRI or CT showed recurrent HNP L5-S1 R.   Past Medical History:  Diagnosis Date  . Anxiety   . Breast mass 02/2014  . Chronic back pain   . Chronic constipation   . Depression   . GERD (gastroesophageal reflux disease)   . H/O cardiac radiofrequency ablation 1996  . History of kidney stones    one lodged - 2 mm.  . Hypothyroidism   . Neuromuscular disorder (HCC)    sciatica   . Rheumatoid arthritis ([place])    pt. denies, reassessed & told that wasnot accurate, OA- lumbar & cervical    . Sleep apnea    CPAP, lasted used 2 months ago  . Wolff-Parkinson-White (WPW) syndrome     Past Surgical History:  Procedure Laterality Date  . CARDIAC ELECTROPHYSIOLOGY MAPPING AND ABLATION     WPW  . CERVICAL SPINE SURGERY     fusions x2  . COLONOSCOPY  04/06/2013  . ESOPHAGOGASTRODUODENOSCOPY  04/06/2013  . LUMBAR SPINE SURGERY     x 3  . SIGMOIDOSCOPY    . THYROIDECTOMY    . TONSILLECTOMY      Prior to Admission medications   Medication Sig Start Date End Date Taking? Authorizing Provider  amphetamine-dextroamphetamine (ADDERALL) 20 MG tablet Take 20 mg by mouth 2 (two) times daily. 07/26/16  Yes Historical Provider, MD  ARIPiprazole (ABILIFY) 10 MG tablet Take 5 mg by mouth daily. 06/10/16  Yes Historical Provider, MD  buPROPion (WELLBUTRIN XL) 150 MG 24 hr tablet Take 150 mg by mouth daily before breakfast.  04/04/13  Yes Historical Provider, MD  calcium carbonate (TUMS - DOSED IN MG ELEMENTAL CALCIUM) 500 MG chewable tablet Chew 2 tablets by mouth at bedtime as needed for indigestion or heartburn.   Yes  Historical Provider, MD  cetirizine (ZYRTEC) 10 MG tablet Take 10 mg by mouth at bedtime.   Yes Historical Provider, MD  Cholecalciferol (VITAMIN D3) 5000 units CAPS Take 5,000 Units by mouth daily.   Yes Historical Provider, MD  clonazePAM (KLONOPIN) 2 MG tablet Take 2 mg by mouth at bedtime.  03/03/13  Yes Historical Provider, MD  doxepin (SINEQUAN) 25 MG capsule Take 50 mg by mouth at bedtime. 07/23/16  Yes Historical Provider, MD  DULoxetine (CYMBALTA) 30 MG capsule Take 60 mg by mouth daily.   Yes Historical Provider, MD  fluticasone (FLONASE) 50 MCG/ACT nasal spray Place 2 sprays into both nostrils at bedtime.    Yes Historical Provider, MD  levothyroxine (SYNTHROID, LEVOTHROID) 150 MCG tablet Take 150 mcg by mouth daily before breakfast.   Yes Historical Provider, MD  lidocaine (LIDODERM) 5 % Place 1 patch onto the skin at bedtime. 08/19/16  Yes Historical Provider, MD  meloxicam (MOBIC) 15 MG tablet Take 15 mg by mouth daily.  03/07/13  Yes Historical Provider, MD  ondansetron (ZOFRAN-ODT) 4 MG disintegrating tablet Take 4 mg by mouth every 8 (eight) hours as needed for nausea or vomiting.   Yes Historical Provider, MD  oxycodone (ROXICODONE) 30 MG immediate release tablet Take 30 mg by mouth 5 (five) times daily.    Yes Historical  Provider, MD  ranitidine (ZANTAC) 150 MG tablet Take 150 mg by mouth 2 (two) times daily.    Yes Historical Provider, MD  tizanidine (ZANAFLEX) 6 MG capsule Take 6 mg by mouth daily as needed for muscle spasms. 05/25/16  Yes Historical Provider, MD  topiramate (TOPAMAX) 50 MG tablet Take 50 mg by mouth daily.   Yes Historical Provider, MD  varenicline (CHANTIX) 1 MG tablet Take 1 mg by mouth 2 (two) times daily.   Yes Historical Provider, MD  VIIBRYD 40 MG TABS Take 40 mg by mouth daily.  01/11/13  Yes Historical Provider, MD  lubiprostone (AMITIZA) 8 MCG capsule Take 1 capsule (8 mcg total) by mouth 2 (two) times daily with a meal. Patient not taking: Reported on  08/31/2016 12/13/14   Amy S Esterwood, PA-C   Allergies  Allergen Reactions  . Darvocet [Propoxyphene N-Acetaminophen] Other (See Comments)    Heart palpitations   . Dilaudid [Hydromorphone Hcl] Other (See Comments)    Heart palpitations   . Phentermine Hcl Other (See Comments)    Heart palpitations   . Shellfish Allergy Hives    Social History  Substance Use Topics  . Smoking status: Current Every Day Smoker    Packs/day: 0.25    Types: Cigarettes  . Smokeless tobacco: Former Systems developer     Comment: Primary school teacher  . Alcohol use No    Family History  Problem Relation Age of Onset  . Colon polyps Mother      Review of Systems  Positive ROS: neg  All other systems have been reviewed and were otherwise negative with the exception of those mentioned in the HPI and as above.  Objective: Vital signs in last 24 hours:    General Appearance: Alert, cooperative, no distress, appears stated age Head: Normocephalic, without obvious abnormality, atraumatic Eyes: PERRL, conjunctiva/corneas clear, EOM's intact    Neck: Supple, symmetrical, trachea midline Back: Symmetric, no curvature, ROM normal, no CVA tenderness Lungs:  respirations unlabored Heart: Regular rate and rhythm Abdomen: Soft, non-tender Extremities: Extremities normal, atraumatic, no cyanosis or edema Pulses: 2+ and symmetric all extremities Skin: Skin color, texture, turgor normal, no rashes or lesions  NEUROLOGIC:   Mental status: Alert and oriented x4,  no aphasia, good attention span, fund of knowledge, and memory Motor Exam - grossly normal Sensory Exam - grossly normal Reflexes: 1+ Coordination - grossly normal Gait - grossly normal Balance - grossly normal Cranial Nerves: I: smell Not tested  II: visual acuity  OS: nl    OD: nl  II: visual fields Full to confrontation  II: pupils Equal, round, reactive to light  III,VII: ptosis None  III,IV,VI: extraocular muscles  Full ROM  V: mastication Normal  V:  facial light touch sensation  Normal  V,VII: corneal reflex  Present  VII: facial muscle function - upper  Normal  VII: facial muscle function - lower Normal  VIII: hearing Not tested  IX: soft palate elevation  Normal  IX,X: gag reflex Present  XI: trapezius strength  5/5  XI: sternocleidomastoid strength 5/5  XI: neck flexion strength  5/5  XII: tongue strength  Normal    Data Review Lab Results  Component Value Date   WBC 8.2 12/13/2014   HGB 15.5 (H) 12/13/2014   HCT 46.3 (H) 12/13/2014   MCV 87.1 12/13/2014   PLT 250.0 12/13/2014   Lab Results  Component Value Date   NA 135 12/13/2014   K 4.0 12/13/2014   CL 100 12/13/2014  CO2 30 12/13/2014   BUN 7 12/13/2014   CREATININE 0.77 12/13/2014   GLUCOSE 87 12/13/2014   Lab Results  Component Value Date   INR 1.04 02/20/2011    Assessment/Plan: Patient admitted for re-do R L5-S1 microdiskectomy. Patient has failed a reasonable attempt at conservative therapy.  I explained the condition and procedure to the patient and answered any questions.  Patient wishes to proceed with procedure as planned. Understands risks/ benefits and typical outcomes of procedure.   Dondra Rhett S 09/02/2016 6:54 AM

## 2016-09-02 NOTE — Anesthesia Postprocedure Evaluation (Signed)
Anesthesia Post Note  Patient: [name]  Procedure(s) Performed: Procedure(s) (LRB): RIGHT LUMBAR FIVE-SACRAL ONE REDO MICRODISCECTOMY (Right)  Patient location during evaluation: PACU Anesthesia Type: General Level of consciousness: awake and alert Pain management: pain level controlled Vital Signs Assessment: post-procedure vital signs reviewed and stable Respiratory status: spontaneous breathing, nonlabored ventilation, respiratory function stable and patient connected to nasal cannula oxygen Cardiovascular status: blood pressure returned to baseline and stable Postop Assessment: no signs of nausea or vomiting Anesthetic complications: no       Last Vitals:  Vitals:   09/02/16 1333 09/02/16 1343  BP:    Pulse: 87 89  Resp: 18 14  Temp:  36.1 C    Last Pain:  Vitals:   09/02/16 1333  TempSrc:   PainSc: [place]

## 2016-09-02 NOTE — Anesthesia Preprocedure Evaluation (Signed)
Anesthesia Evaluation  Patient identified by MRN, date of birth, ID band Patient awake    Reviewed: Allergy & Precautions, NPO status , Patient's Chart, lab work & pertinent test results  Airway Mallampati: II  TM Distance: >3 FB Neck ROM: Full    Dental no notable dental hx.    Pulmonary neg pulmonary ROS, sleep apnea , Current Smoker,    Pulmonary exam normal breath sounds clear to auscultation       Cardiovascular negative cardio ROS Normal cardiovascular exam Rhythm:Regular Rate:Normal     Neuro/Psych PSYCHIATRIC DISORDERS Anxiety Depression negative neurological ROS  negative psych ROS   GI/Hepatic negative GI ROS, Neg liver ROS, GERD  ,  Endo/Other  negative endocrine ROSHypothyroidism   Renal/GU negative Renal ROS  negative genitourinary   Musculoskeletal negative musculoskeletal ROS (+) Arthritis , Osteoarthritis,    Abdominal   Peds negative pediatric ROS (+)  Hematology negative hematology ROS (+)   Anesthesia Other Findings   Reproductive/Obstetrics negative OB ROS                             Anesthesia Physical Anesthesia Plan  ASA: II  Anesthesia Plan: General   Post-op Pain Management:    Induction: Intravenous  Airway Management Planned: Oral ETT  Additional Equipment:   Intra-op Plan:   Post-operative Plan: Extubation in OR  Informed Consent: I have reviewed the patients History and Physical, chart, labs and discussed the procedure including the risks, benefits and alternatives for the proposed anesthesia with the patient or authorized representative who has indicated his/her understanding and acceptance.   Dental advisory given  Plan Discussed with: CRNA and Surgeon  Anesthesia Plan Comments:         Anesthesia Quick Evaluation

## 2016-09-02 NOTE — Transfer of Care (Signed)
Immediate Anesthesia Transfer of Care Note  Patient: [name]  Procedure(s) Performed: Procedure(s): RIGHT LUMBAR FIVE-SACRAL ONE REDO MICRODISCECTOMY (Right)  Patient Location: PACU  Anesthesia Type:General  Level of Consciousness: awake, alert , oriented and patient cooperative  Airway & Oxygen Therapy: Patient Spontanous Breathing and Patient connected to nasal cannula oxygen  Post-op Assessment: Report given to RN, Post -op Vital signs reviewed and stable and Patient moving all extremities X 4  Post vital signs: Reviewed and stable  Last Vitals:  Vitals:   09/02/16 1304 09/02/16 1308  BP:  133/76  Pulse: 92 88  Resp:  10  Temp: 36.4 C     Last Pain:  Vitals:   09/02/16 1010  TempSrc:   PainSc: 6       Patients Stated Pain Goal: 3 (01/60/10 9323)  Complications: No apparent anesthesia complications

## 2016-09-02 NOTE — Op Note (Signed)
09/02/2016  12:57 PM  PATIENT:  [name]  46 y.o. female  PRE-OPERATIVE DIAGNOSIS:  Recurrent HNP L5-S1 right  POST-OPERATIVE DIAGNOSIS:  same  PROCEDURE:  Right re-do lumbar laminectomy and foraminotomy with re-do microdiskectomy  Using microdissection  SURGEON:  Sherley Bounds, MD  ASSISTANTS: Dr Arnoldo Morale  ANESTHESIA:   General  EBL: 75 ml  Total I/O In: 900 [I.V.:900] Out: 75 [Blood:75]  BLOOD ADMINISTERED: none  DRAINS: none  SPECIMEN:  none  INDICATION FOR PROCEDURE: This patient presented with severe R leg pain. Imaging showed recurrent R L5-S1 HNP. The patient tried conservative measures without relief. Pain was debilitating. Recommended redo R L5-S1 microdiskectomy. Patient understood the risks, benefits, and alternatives and potential outcomes and wished to proceed.  PROCEDURE DETAILS: The patient was taken to the operating room and after induction of adequate generalized endotracheal anesthesia, the patient was rolled into the prone position on the Wilson frame and all pressure points were padded. The lumbar region was cleaned and then prepped with DuraPrep and draped in the usual sterile fashion. 5 cc of local anesthesia was injected and then a dorsal midline incision was made and carried down to the lumbo sacral fascia. The fascia was opened and the paraspinous musculature was taken down in a subperiosteal fashion to expose  L5-S1 on the right. Intraoperative x-ray confirmed my level, and then I used a combination of the high-speed drill and the Kerrison punches to perform a redo hemilaminectomy, medial facetectomy, and foraminotomy at L5-S1 on the right. The underlying yellow ligament was opened and removed in a piecemeal fashion and the scar was detethered from the medial facet to expose the underlying dura and exiting nerve root. I undercut the lateral recess and dissected down until I was medial to and distal to the pedicle. The nerve root was well decompressed.  We then gently retracted the nerve root medially with a retractor, coagulated the epidural venous vasculature, and removed several smal pieces of disk from under the S1 root. performed a thorough intradiscal discectomy with pituitary rongeurs , until I had a nice decompression of the nerve root and the midline. I then palpated with a coronary dilator along the nerve root and into the foramen to assure adequate decompression. I felt no more compression of the nerve root. I irrigated with saline solution containing bacitracin. Achieved hemostasis with bipolar cautery, lined the dura with Gelfoam, and then closed the fascia with 0 Vicryl. I closed the subcutaneous tissues with 2-0 Vicryl and the subcuticular tissues with 3-0 Vicryl. The skin was then closed with benzoin and Steri-Strips. The drapes were removed, a sterile dressing was applied. The patient was awakened from general anesthesia and transferred to the recovery room in stable condition. At the end of the procedure all sponge, needle and instrument counts were correct.    PLAN OF CARE: Admit for overnight observation  PATIENT DISPOSITION:  PACU - hemodynamically stable.   Delay start of Pharmacological VTE agent (>24hrs) due to surgical blood loss or risk of bleeding:  yes

## 2016-09-03 ENCOUNTER — Encounter (HOSPITAL_COMMUNITY): Payer: Self-pay | Admitting: Neurological Surgery

## 2016-09-03 NOTE — Discharge Summary (Signed)
Physician Discharge Summary  Patient ID: [name] MRN: [id number] DOB/AGE: [birth year] 46 y.o.  Admit date: 09/02/2016 Discharge date: 09/03/2016  Admission Diagnoses: recurrent HNP L5-S1    Discharge Diagnoses: same   Discharged Condition: good  Hospital Course: The patient was admitted on 09/02/2016 and taken to the operating room where the patient underwent redo midrodiskectomy. The patient tolerated the procedure well and was taken to the recovery room and then to the floor in stable condition. The hospital course was routine. There were no complications. The wound remained clean dry and intact. Pt had appropriate back soreness. No complaints of leg pain or new N/T/W. The patient remained afebrile with stable vital signs, and tolerated a regular diet. The patient continued to increase activities, and pain was well controlled with oral pain medications.   Consults: None  Significant Diagnostic Studies:  Results for orders placed or performed during the hospital encounter of 09/02/16  Surgical pcr screen  Result Value Ref Range   MRSA, PCR NEGATIVE NEGATIVE   Staphylococcus aureus POSITIVE (A) NEGATIVE  Basic metabolic panel  Result Value Ref Range   Sodium 140 135 - 145 mmol/L   Potassium 3.5 3.5 - 5.1 mmol/L   Chloride 107 101 - 111 mmol/L   CO2 25 22 - 32 mmol/L   Glucose, Bld 99 65 - 99 mg/dL   BUN 8 6 - 20 mg/dL   Creatinine, Ser 0.89 0.44 - 1.00 mg/dL   Calcium 9.5 8.9 - 10.3 mg/dL   GFR calc non Af Amer >60 >60 mL/min   GFR calc Af Amer >60 >60 mL/min   Anion gap 8 5 - 15  CBC  Result Value Ref Range   WBC 9.4 4.0 - 10.5 K/uL   RBC 5.48 (H) 3.87 - 5.11 MIL/uL   Hemoglobin 15.2 (H) 12.0 - 15.0 g/dL   HCT 46.6 (H) 36.0 - 46.0 %   MCV 85.0 78.0 - 100.0 fL   MCH 27.7 26.0 - 34.0 pg   MCHC 32.6 30.0 - 36.0 g/dL   RDW 13.2 11.5 - 15.5 %   Platelets 255 150 - 400 K/uL  hCG, serum, qualitative  Result Value Ref Range   Preg, Serum NEGATIVE NEGATIVE    Dg  Lumbar Spine 2-3 Views  Result Date: 09/02/2016 CLINICAL DATA:  L5 S1 microdisectomy cross-tables images EXAM: LUMBAR SPINE - 2-3 VIEW COMPARISON:  MR 08/26/2016 FINDINGS: The first film shows posterior surgical instruments, probe tip projecting behind the L L4-5 interspace. Second film shows posterior surgical instruments, probe tip projecting behind the L5-S1 interspace IMPRESSION: Intraoperative localization Electronically Signed   By: Lucrezia Europe M.D.   On: 09/02/2016 15:17   Mr Brain Wo Contrast  Result Date: 08/14/2016 CLINICAL DATA:  Severe dizziness, headaches, and blurred vision for 2 months. EXAM: MRI HEAD WITHOUT CONTRAST TECHNIQUE: Multiplanar, multiecho pulse sequences of the brain and surrounding structures were obtained without intravenous contrast. COMPARISON:  09/30/2013 FINDINGS: Brain: There is no evidence of acute infarct, intracranial hemorrhage, mass, midline shift, or extra-axial fluid collection. The ventricles and sulci are normal. Scattered punctate foci of subcortical and deep cerebral white matter T2 hyperintensity are stable to minimally increased in number (increased conspicuity on today's study may be due to 3T imaging versus 1.5T previously). Vascular: Major intracranial vascular flow voids are preserved. Skull and upper cervical spine: Unremarkable bone marrow signal. Sinuses/Orbits: Unremarkable orbits. Trace bilateral mastoid effusions, unchanged. Paranasal sinuses are clear. Other: None. IMPRESSION: 1. No acute intracranial abnormality or mass. 2.  Minimal cerebral white matter T2 signal changes, nonspecific. These may reflect the sequelae of migraines or early chronic small vessel ischemic disease. Electronically Signed   By: Logan Bores M.D.   On: 08/14/2016 15:10    Antibiotics:  Anti-infectives    Start     Dose/Rate Route Frequency Ordered Stop   09/02/16 2000  ceFAZolin (ANCEF) IVPB 2g/100 mL premix  Status:  Discontinued     2 g 200 mL/hr over 30 Minutes  Intravenous Every 8 hours 09/02/16 1407 09/03/16 0118   09/02/16 1218  bacitracin 50,000 Units in sodium chloride irrigation 0.9 % 500 mL irrigation  Status:  Discontinued       As needed 09/02/16 1219 09/02/16 1300   09/02/16 0945  ceFAZolin (ANCEF) IVPB 2g/100 mL premix     2 g 200 mL/hr over 30 Minutes Intravenous On call to O.R. 09/02/16 0938 09/02/16 1129      Discharge Exam: Blood pressure 112/72, pulse 95, temperature 98.5 F (36.9 C), temperature source Oral, resp. rate 18, height 5\' 7"  (1.702 m), weight 89.4 kg (197 lb), last menstrual period 08/18/2016, SpO2 97 %. Neurologic: Grossly normal Dressing dry  Discharge Medications:   Allergies as of 09/02/2016      Reactions   Darvocet [propoxyphene N-acetaminophen] Other (See Comments)   Heart palpitations    Dilaudid [hydromorphone Hcl] Other (See Comments)   Heart palpitations    Phentermine Hcl Other (See Comments)   Heart palpitations    Shellfish Allergy Hives      Medication List    ASK your doctor about these medications   amphetamine-dextroamphetamine 20 MG tablet Commonly known as:  ADDERALL Take 20 mg by mouth 2 (two) times daily.   ARIPiprazole 10 MG tablet Commonly known as:  ABILIFY Take 5 mg by mouth daily.   buPROPion 150 MG 24 hr tablet Commonly known as:  WELLBUTRIN XL Take 150 mg by mouth daily before breakfast.   calcium carbonate 500 MG chewable tablet Commonly known as:  TUMS - dosed in mg elemental calcium Chew 2 tablets by mouth at bedtime as needed for indigestion or heartburn.   cetirizine 10 MG tablet Commonly known as:  ZYRTEC Take 10 mg by mouth at bedtime.   clonazePAM 2 MG tablet Commonly known as:  KLONOPIN Take 2 mg by mouth at bedtime.   doxepin 25 MG capsule Commonly known as:  SINEQUAN Take 50 mg by mouth at bedtime.   DULoxetine 30 MG capsule Commonly known as:  CYMBALTA Take 60 mg by mouth daily.   fluticasone 50 MCG/ACT nasal spray Commonly known as:   FLONASE Place 2 sprays into both nostrils at bedtime.   levothyroxine 150 MCG tablet Commonly known as:  SYNTHROID, LEVOTHROID Take 150 mcg by mouth daily before breakfast.   lidocaine 5 % Commonly known as:  LIDODERM Place 1 patch onto the skin at bedtime.   lubiprostone 8 MCG capsule Commonly known as:  AMITIZA Take 1 capsule (8 mcg total) by mouth 2 (two) times daily with a meal.   meloxicam 15 MG tablet Commonly known as:  MOBIC Take 15 mg by mouth daily.   ondansetron 4 MG disintegrating tablet Commonly known as:  ZOFRAN-ODT Take 4 mg by mouth every 8 (eight) hours as needed for nausea or vomiting.   oxycodone 30 MG immediate release tablet Commonly known as:  ROXICODONE Take 30 mg by mouth 5 (five) times daily.   ranitidine 150 MG tablet Commonly known as:  ZANTAC Take 150 mg  by mouth 2 (two) times daily.   tizanidine 6 MG capsule Commonly known as:  ZANAFLEX Take 6 mg by mouth daily as needed for muscle spasms.   topiramate 50 MG tablet Commonly known as:  TOPAMAX Take 50 mg by mouth daily.   varenicline 1 MG tablet Commonly known as:  CHANTIX Take 1 mg by mouth 2 (two) times daily.   VIIBRYD 40 MG Tabs Generic drug:  Vilazodone HCl Take 40 mg by mouth daily.   Vitamin D3 5000 units Caps Take 5,000 Units by mouth daily.       Disposition: home   Final Dx: re-do microdiskectomy       Signed: Novalie Leamy S 09/03/2016, 6:30 AM

## 2016-09-18 DIAGNOSIS — M79662 Pain in left lower leg: Secondary | ICD-10-CM | POA: Diagnosis not present

## 2016-09-18 DIAGNOSIS — M79605 Pain in left leg: Secondary | ICD-10-CM | POA: Diagnosis not present

## 2016-09-18 DIAGNOSIS — G894 Chronic pain syndrome: Secondary | ICD-10-CM | POA: Diagnosis not present

## 2016-09-18 DIAGNOSIS — M545 Low back pain: Secondary | ICD-10-CM | POA: Diagnosis not present

## 2016-09-18 DIAGNOSIS — Z79891 Long term (current) use of opiate analgesic: Secondary | ICD-10-CM | POA: Diagnosis not present

## 2016-09-23 ENCOUNTER — Ambulatory Visit (INDEPENDENT_AMBULATORY_CARE_PROVIDER_SITE_OTHER): Payer: BLUE CROSS/BLUE SHIELD

## 2016-09-23 ENCOUNTER — Ambulatory Visit (INDEPENDENT_AMBULATORY_CARE_PROVIDER_SITE_OTHER): Payer: BLUE CROSS/BLUE SHIELD | Admitting: Podiatry

## 2016-09-23 ENCOUNTER — Encounter: Payer: Self-pay | Admitting: Podiatry

## 2016-09-23 DIAGNOSIS — M21621 Bunionette of right foot: Secondary | ICD-10-CM

## 2016-09-23 DIAGNOSIS — M79671 Pain in right foot: Secondary | ICD-10-CM | POA: Diagnosis not present

## 2016-09-23 DIAGNOSIS — M79672 Pain in left foot: Secondary | ICD-10-CM

## 2016-09-23 DIAGNOSIS — M779 Enthesopathy, unspecified: Secondary | ICD-10-CM

## 2016-09-24 DIAGNOSIS — F900 Attention-deficit hyperactivity disorder, predominantly inattentive type: Secondary | ICD-10-CM | POA: Diagnosis not present

## 2016-09-24 DIAGNOSIS — F3342 Major depressive disorder, recurrent, in full remission: Secondary | ICD-10-CM | POA: Diagnosis not present

## 2016-09-24 DIAGNOSIS — F410 Panic disorder [episodic paroxysmal anxiety] without agoraphobia: Secondary | ICD-10-CM | POA: Diagnosis not present

## 2016-09-24 NOTE — Progress Notes (Signed)
Subjective:     Patient ID: [name], female   DOB: 20-May-1971, 46 y.o.   MRN: [id number]  HPI patient presents stating that her foot is doing well overall even though she does get tired if she walks a lot and she has just had back surgery   Review of Systems     Objective:   Physical Exam Neurovascular status intact with negative Homans sign noted and patient found to have well-healing surgical site right foot with good alignment. 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

## 2016-10-14 ENCOUNTER — Ambulatory Visit (INDEPENDENT_AMBULATORY_CARE_PROVIDER_SITE_OTHER): Payer: BLUE CROSS/BLUE SHIELD

## 2016-10-14 ENCOUNTER — Ambulatory Visit (INDEPENDENT_AMBULATORY_CARE_PROVIDER_SITE_OTHER): Payer: BLUE CROSS/BLUE SHIELD | Admitting: Podiatry

## 2016-10-14 ENCOUNTER — Other Ambulatory Visit: Payer: BLUE CROSS/BLUE SHIELD

## 2016-10-14 DIAGNOSIS — M779 Enthesopathy, unspecified: Secondary | ICD-10-CM

## 2016-10-14 DIAGNOSIS — S99921A Unspecified injury of right foot, initial encounter: Secondary | ICD-10-CM | POA: Diagnosis not present

## 2016-10-14 NOTE — Progress Notes (Signed)
Subjective:     Patient ID: [name], female   DOB: 1970/09/06, 46 y.o.   MRN: [id number]  HPI patient presents stating she traumatized her right foot when she slipped on wet water and is concerned because of pain and wants to make sure that overall she is healing well   Review of Systems     Objective:   Physical Exam Neurovascular status intact with mild edema around the second third fourth metatarsals right with history of black and blue marks that or not there now and mild discomfort lateral side right foot around the peroneal base    Assessment:     Trauma to the right foot with possibility for bone injury from previous surgery    Plan:     X-rays taken reviewed and advised patient on ice therapy elevation and compression but this should heal uneventfully  X-rays right foot indicate that the pins and screw are intact and the healing has occurred with the fifth metatarsal having moved but is now completely healed in. Did not note any trauma from the injury she just experienced

## 2016-10-16 DIAGNOSIS — M79662 Pain in left lower leg: Secondary | ICD-10-CM | POA: Diagnosis not present

## 2016-10-16 DIAGNOSIS — G894 Chronic pain syndrome: Secondary | ICD-10-CM | POA: Diagnosis not present

## 2016-10-16 DIAGNOSIS — M545 Low back pain: Secondary | ICD-10-CM | POA: Diagnosis not present

## 2016-10-16 DIAGNOSIS — M79605 Pain in left leg: Secondary | ICD-10-CM | POA: Diagnosis not present

## 2016-10-27 ENCOUNTER — Other Ambulatory Visit: Payer: Self-pay | Admitting: Family Medicine

## 2016-10-30 DIAGNOSIS — R42 Dizziness and giddiness: Secondary | ICD-10-CM | POA: Diagnosis not present

## 2016-10-30 DIAGNOSIS — B001 Herpesviral vesicular dermatitis: Secondary | ICD-10-CM | POA: Diagnosis not present

## 2016-11-16 DIAGNOSIS — M5417 Radiculopathy, lumbosacral region: Secondary | ICD-10-CM | POA: Diagnosis not present

## 2016-11-16 DIAGNOSIS — G894 Chronic pain syndrome: Secondary | ICD-10-CM | POA: Diagnosis not present

## 2016-11-16 DIAGNOSIS — M25551 Pain in right hip: Secondary | ICD-10-CM | POA: Diagnosis not present

## 2016-11-16 DIAGNOSIS — M25552 Pain in left hip: Secondary | ICD-10-CM | POA: Diagnosis not present

## 2016-11-18 ENCOUNTER — Telehealth: Payer: Self-pay | Admitting: Neurology

## 2016-11-18 ENCOUNTER — Ambulatory Visit: Payer: BLUE CROSS/BLUE SHIELD | Admitting: Neurology

## 2016-11-18 NOTE — Telephone Encounter (Signed)
This patient canceled the same day of the new patient appointment today.

## 2016-11-19 ENCOUNTER — Encounter: Payer: Self-pay | Admitting: Neurology

## 2016-12-16 DIAGNOSIS — M5417 Radiculopathy, lumbosacral region: Secondary | ICD-10-CM | POA: Diagnosis not present

## 2016-12-16 DIAGNOSIS — M5412 Radiculopathy, cervical region: Secondary | ICD-10-CM | POA: Diagnosis not present

## 2016-12-16 DIAGNOSIS — M79661 Pain in right lower leg: Secondary | ICD-10-CM | POA: Diagnosis not present

## 2016-12-16 DIAGNOSIS — M79604 Pain in right leg: Secondary | ICD-10-CM | POA: Diagnosis not present

## 2016-12-23 DIAGNOSIS — N951 Menopausal and female climacteric states: Secondary | ICD-10-CM | POA: Diagnosis not present

## 2016-12-31 DIAGNOSIS — E8941 Symptomatic postprocedural ovarian failure: Secondary | ICD-10-CM | POA: Diagnosis not present

## 2017-01-01 DIAGNOSIS — M545 Low back pain: Secondary | ICD-10-CM | POA: Diagnosis not present

## 2017-01-01 DIAGNOSIS — R002 Palpitations: Secondary | ICD-10-CM | POA: Diagnosis not present

## 2017-01-01 DIAGNOSIS — R9431 Abnormal electrocardiogram [ECG] [EKG]: Secondary | ICD-10-CM | POA: Diagnosis not present

## 2017-01-01 DIAGNOSIS — I493 Ventricular premature depolarization: Secondary | ICD-10-CM | POA: Diagnosis not present

## 2017-01-01 DIAGNOSIS — I456 Pre-excitation syndrome: Secondary | ICD-10-CM | POA: Diagnosis not present

## 2017-01-01 DIAGNOSIS — Z6835 Body mass index (BMI) 35.0-35.9, adult: Secondary | ICD-10-CM | POA: Diagnosis not present

## 2017-01-07 NOTE — Anesthesia Postprocedure Evaluation (Signed)
Anesthesia Post Note  Patient: [name]  Procedure(s) Performed: Procedure(s) (LRB): RIGHT LUMBAR FIVE-SACRAL ONE REDO MICRODISCECTOMY (Right)     Anesthesia Post Evaluation  Last Vitals:  Vitals:   09/02/16 1656 09/02/16 2000  BP: 107/76 112/72  Pulse: 98 95  Resp: 16 18  Temp: 36.9 C 36.9 C    Last Pain:  Vitals:   09/02/16 2000  TempSrc: Oral  PainSc:                  Lynda Rainwater

## 2017-01-07 NOTE — Addendum Note (Signed)
Addendum  created 01/07/17 1624 by Jesson Foskey Ray, MD   Sign clinical note    

## 2017-01-14 DIAGNOSIS — G894 Chronic pain syndrome: Secondary | ICD-10-CM | POA: Diagnosis not present

## 2017-01-14 DIAGNOSIS — M25552 Pain in left hip: Secondary | ICD-10-CM | POA: Diagnosis not present

## 2017-01-14 DIAGNOSIS — M542 Cervicalgia: Secondary | ICD-10-CM | POA: Diagnosis not present

## 2017-01-14 DIAGNOSIS — M545 Low back pain: Secondary | ICD-10-CM | POA: Diagnosis not present

## 2017-01-15 ENCOUNTER — Ambulatory Visit: Payer: BLUE CROSS/BLUE SHIELD | Admitting: Neurology

## 2017-01-15 ENCOUNTER — Telehealth: Payer: Self-pay | Admitting: Neurology

## 2017-01-15 NOTE — Telephone Encounter (Signed)
This is the second new patient no-show for this patient, she will be discharged from our practice.

## 2017-01-19 ENCOUNTER — Encounter: Payer: Self-pay | Admitting: Neurology

## 2017-01-19 DIAGNOSIS — F3342 Major depressive disorder, recurrent, in full remission: Secondary | ICD-10-CM | POA: Diagnosis not present

## 2017-01-22 ENCOUNTER — Ambulatory Visit (INDEPENDENT_AMBULATORY_CARE_PROVIDER_SITE_OTHER): Payer: BLUE CROSS/BLUE SHIELD

## 2017-01-22 ENCOUNTER — Other Ambulatory Visit: Payer: Self-pay | Admitting: Podiatry

## 2017-01-22 ENCOUNTER — Ambulatory Visit (INDEPENDENT_AMBULATORY_CARE_PROVIDER_SITE_OTHER): Payer: BLUE CROSS/BLUE SHIELD | Admitting: Podiatry

## 2017-01-22 ENCOUNTER — Encounter: Payer: Self-pay | Admitting: Podiatry

## 2017-01-22 DIAGNOSIS — M21629 Bunionette of unspecified foot: Secondary | ICD-10-CM

## 2017-01-22 DIAGNOSIS — S99922A Unspecified injury of left foot, initial encounter: Secondary | ICD-10-CM

## 2017-01-22 DIAGNOSIS — M21621 Bunionette of right foot: Secondary | ICD-10-CM

## 2017-01-22 DIAGNOSIS — M21622 Bunionette of left foot: Secondary | ICD-10-CM

## 2017-01-24 NOTE — Progress Notes (Signed)
Subjective:    Patient ID: [name], female   DOB: 46 y.o.   MRN: [id number]   HPI patient presents stating that she thinks she broke her left fifth toe and it's been sore and she's very happy with her structural corrections    ROS      Objective:  Physical Exam neurovascular status intact with patient noted to have excellent healing of osteotomy sites bilateral with good alignment and minimal scar formation with edema of the fifth digit left foot distal one half of the toe     Assessment:    Possibility for fracture versus contusion of the left fifth digit     Plan:    H&P x-ray reviewed and recommended wider-type shoes ice therapy and the fact that this will probably take 8-12 weeks to heal. Reappoint to recheck  X-rays were negative for acute fracture appears to be a deep contusion

## 2017-02-01 DIAGNOSIS — R9431 Abnormal electrocardiogram [ECG] [EKG]: Secondary | ICD-10-CM | POA: Diagnosis not present

## 2017-02-01 DIAGNOSIS — R011 Cardiac murmur, unspecified: Secondary | ICD-10-CM | POA: Diagnosis not present

## 2017-02-01 DIAGNOSIS — R42 Dizziness and giddiness: Secondary | ICD-10-CM | POA: Diagnosis not present

## 2017-02-03 ENCOUNTER — Encounter: Payer: Self-pay | Admitting: Neurology

## 2017-02-15 DIAGNOSIS — M542 Cervicalgia: Secondary | ICD-10-CM | POA: Diagnosis not present

## 2017-02-15 DIAGNOSIS — M25552 Pain in left hip: Secondary | ICD-10-CM | POA: Diagnosis not present

## 2017-02-15 DIAGNOSIS — M545 Low back pain: Secondary | ICD-10-CM | POA: Diagnosis not present

## 2017-02-15 DIAGNOSIS — G894 Chronic pain syndrome: Secondary | ICD-10-CM | POA: Diagnosis not present

## 2017-02-16 DIAGNOSIS — G4709 Other insomnia: Secondary | ICD-10-CM | POA: Diagnosis not present

## 2017-02-16 DIAGNOSIS — F410 Panic disorder [episodic paroxysmal anxiety] without agoraphobia: Secondary | ICD-10-CM | POA: Diagnosis not present

## 2017-02-16 DIAGNOSIS — F3342 Major depressive disorder, recurrent, in full remission: Secondary | ICD-10-CM | POA: Diagnosis not present

## 2017-02-28 DIAGNOSIS — R600 Localized edema: Secondary | ICD-10-CM | POA: Diagnosis not present

## 2017-03-02 DIAGNOSIS — M7989 Other specified soft tissue disorders: Secondary | ICD-10-CM | POA: Diagnosis not present

## 2017-03-02 DIAGNOSIS — R791 Abnormal coagulation profile: Secondary | ICD-10-CM | POA: Diagnosis not present

## 2017-03-04 DIAGNOSIS — M2550 Pain in unspecified joint: Secondary | ICD-10-CM | POA: Diagnosis not present

## 2017-03-04 DIAGNOSIS — E039 Hypothyroidism, unspecified: Secondary | ICD-10-CM | POA: Diagnosis not present

## 2017-03-16 DIAGNOSIS — Z23 Encounter for immunization: Secondary | ICD-10-CM | POA: Diagnosis not present

## 2017-03-16 DIAGNOSIS — G5602 Carpal tunnel syndrome, left upper limb: Secondary | ICD-10-CM | POA: Diagnosis not present

## 2017-03-17 DIAGNOSIS — M545 Low back pain: Secondary | ICD-10-CM | POA: Diagnosis not present

## 2017-03-17 DIAGNOSIS — M25552 Pain in left hip: Secondary | ICD-10-CM | POA: Diagnosis not present

## 2017-03-17 DIAGNOSIS — G894 Chronic pain syndrome: Secondary | ICD-10-CM | POA: Diagnosis not present

## 2017-03-17 DIAGNOSIS — M542 Cervicalgia: Secondary | ICD-10-CM | POA: Diagnosis not present

## 2017-03-18 DIAGNOSIS — G5602 Carpal tunnel syndrome, left upper limb: Secondary | ICD-10-CM | POA: Diagnosis not present

## 2017-03-18 DIAGNOSIS — M222X1 Patellofemoral disorders, right knee: Secondary | ICD-10-CM | POA: Diagnosis not present

## 2017-04-14 DIAGNOSIS — G4733 Obstructive sleep apnea (adult) (pediatric): Secondary | ICD-10-CM | POA: Diagnosis not present

## 2017-04-14 DIAGNOSIS — G4700 Insomnia, unspecified: Secondary | ICD-10-CM | POA: Diagnosis not present

## 2017-04-14 NOTE — Progress Notes (Signed)
HPI patient states I'm doing well with both feet with mild edema that overall doing well with wound edges well coapted and seems like this puts doing even better than the first   Review of Systems     Objective:   Physical Exam Neurovascular status intact negative Homans sign was noted with wound edges well coapted bilateral with mild/moderate edema still noted bilateral but good range of motion of the first MPJ bilateral with wound edges well coapted    Assessment:     Doing well from structural bunion and tailor bunion correction bilateral with excellent range of motion and no indications of pathology    Plan:     Advised this patient on anti-inflammatories physical therapy and wider shoe gear and may gradually begin wearing soft shoe on the left foot. Patient will be seen back to recheck

## 2017-04-16 DIAGNOSIS — M545 Low back pain: Secondary | ICD-10-CM | POA: Diagnosis not present

## 2017-04-16 DIAGNOSIS — G894 Chronic pain syndrome: Secondary | ICD-10-CM | POA: Diagnosis not present

## 2017-04-16 DIAGNOSIS — M25552 Pain in left hip: Secondary | ICD-10-CM | POA: Diagnosis not present

## 2017-04-16 DIAGNOSIS — M542 Cervicalgia: Secondary | ICD-10-CM | POA: Diagnosis not present

## 2017-04-20 DIAGNOSIS — M542 Cervicalgia: Secondary | ICD-10-CM | POA: Diagnosis not present

## 2017-04-22 DIAGNOSIS — M5410 Radiculopathy, site unspecified: Secondary | ICD-10-CM | POA: Diagnosis not present

## 2017-04-26 DIAGNOSIS — F410 Panic disorder [episodic paroxysmal anxiety] without agoraphobia: Secondary | ICD-10-CM | POA: Diagnosis not present

## 2017-04-26 DIAGNOSIS — F3342 Major depressive disorder, recurrent, in full remission: Secondary | ICD-10-CM | POA: Diagnosis not present

## 2017-04-26 DIAGNOSIS — F900 Attention-deficit hyperactivity disorder, predominantly inattentive type: Secondary | ICD-10-CM | POA: Diagnosis not present

## 2017-04-27 ENCOUNTER — Other Ambulatory Visit: Payer: Self-pay | Admitting: Neurological Surgery

## 2017-04-27 DIAGNOSIS — M542 Cervicalgia: Secondary | ICD-10-CM

## 2017-04-27 DIAGNOSIS — F4310 Post-traumatic stress disorder, unspecified: Secondary | ICD-10-CM | POA: Diagnosis not present

## 2017-04-27 DIAGNOSIS — M545 Low back pain: Secondary | ICD-10-CM | POA: Diagnosis not present

## 2017-04-27 DIAGNOSIS — M5136 Other intervertebral disc degeneration, lumbar region: Secondary | ICD-10-CM | POA: Diagnosis not present

## 2017-04-27 DIAGNOSIS — F332 Major depressive disorder, recurrent severe without psychotic features: Secondary | ICD-10-CM | POA: Diagnosis not present

## 2017-04-27 DIAGNOSIS — M5410 Radiculopathy, site unspecified: Secondary | ICD-10-CM | POA: Diagnosis not present

## 2017-04-29 ENCOUNTER — Ambulatory Visit
Admission: RE | Admit: 2017-04-29 | Discharge: 2017-04-29 | Disposition: A | Discharge status: Home / Self Care | Payer: BLUE CROSS/BLUE SHIELD | Source: Ambulatory Visit | Attending: Neurological Surgery | Admitting: Neurological Surgery

## 2017-04-29 DIAGNOSIS — M50223 Other cervical disc displacement at C6-C7 level: Secondary | ICD-10-CM | POA: Diagnosis not present

## 2017-04-29 DIAGNOSIS — M542 Cervicalgia: Secondary | ICD-10-CM

## 2017-04-30 ENCOUNTER — Other Ambulatory Visit: Payer: Self-pay | Admitting: Neurological Surgery

## 2017-04-30 DIAGNOSIS — S129XXD Fracture of neck, unspecified, subsequent encounter: Secondary | ICD-10-CM | POA: Diagnosis not present

## 2017-05-04 DIAGNOSIS — G5601 Carpal tunnel syndrome, right upper limb: Secondary | ICD-10-CM | POA: Diagnosis not present

## 2017-05-04 DIAGNOSIS — M25531 Pain in right wrist: Secondary | ICD-10-CM | POA: Diagnosis not present

## 2017-05-07 DIAGNOSIS — R768 Other specified abnormal immunological findings in serum: Secondary | ICD-10-CM | POA: Diagnosis not present

## 2017-05-07 DIAGNOSIS — M79641 Pain in right hand: Secondary | ICD-10-CM | POA: Diagnosis not present

## 2017-05-07 DIAGNOSIS — M79642 Pain in left hand: Secondary | ICD-10-CM | POA: Diagnosis not present

## 2017-05-07 DIAGNOSIS — R200 Anesthesia of skin: Secondary | ICD-10-CM | POA: Diagnosis not present

## 2017-05-07 DIAGNOSIS — R5383 Other fatigue: Secondary | ICD-10-CM | POA: Diagnosis not present

## 2017-05-07 DIAGNOSIS — M2550 Pain in unspecified joint: Secondary | ICD-10-CM | POA: Diagnosis not present

## 2017-05-07 DIAGNOSIS — M545 Low back pain: Secondary | ICD-10-CM | POA: Diagnosis not present

## 2017-05-17 DIAGNOSIS — G609 Hereditary and idiopathic neuropathy, unspecified: Secondary | ICD-10-CM | POA: Diagnosis not present

## 2017-05-17 DIAGNOSIS — G541 Lumbosacral plexus disorders: Secondary | ICD-10-CM | POA: Diagnosis not present

## 2017-05-18 DIAGNOSIS — N3946 Mixed incontinence: Secondary | ICD-10-CM | POA: Diagnosis not present

## 2017-05-18 DIAGNOSIS — R350 Frequency of micturition: Secondary | ICD-10-CM | POA: Diagnosis not present

## 2017-05-18 DIAGNOSIS — R351 Nocturia: Secondary | ICD-10-CM | POA: Diagnosis not present

## 2017-05-24 DIAGNOSIS — Z3041 Encounter for surveillance of contraceptive pills: Secondary | ICD-10-CM | POA: Diagnosis not present

## 2017-05-24 DIAGNOSIS — Z6836 Body mass index (BMI) 36.0-36.9, adult: Secondary | ICD-10-CM | POA: Diagnosis not present

## 2017-05-24 DIAGNOSIS — Z1231 Encounter for screening mammogram for malignant neoplasm of breast: Secondary | ICD-10-CM | POA: Diagnosis not present

## 2017-05-24 DIAGNOSIS — Z01419 Encounter for gynecological examination (general) (routine) without abnormal findings: Secondary | ICD-10-CM | POA: Diagnosis not present

## 2017-05-25 DIAGNOSIS — M5416 Radiculopathy, lumbar region: Secondary | ICD-10-CM | POA: Diagnosis not present

## 2017-05-26 ENCOUNTER — Ambulatory Visit: Payer: BLUE CROSS/BLUE SHIELD | Admitting: Neurology

## 2017-05-31 DIAGNOSIS — R197 Diarrhea, unspecified: Secondary | ICD-10-CM | POA: Diagnosis not present

## 2017-05-31 DIAGNOSIS — R252 Cramp and spasm: Secondary | ICD-10-CM | POA: Diagnosis not present

## 2017-05-31 DIAGNOSIS — Z79899 Other long term (current) drug therapy: Secondary | ICD-10-CM | POA: Diagnosis not present

## 2017-05-31 DIAGNOSIS — E039 Hypothyroidism, unspecified: Secondary | ICD-10-CM | POA: Diagnosis not present

## 2017-05-31 DIAGNOSIS — E559 Vitamin D deficiency, unspecified: Secondary | ICD-10-CM | POA: Diagnosis not present

## 2017-06-02 DIAGNOSIS — G4700 Insomnia, unspecified: Secondary | ICD-10-CM | POA: Diagnosis not present

## 2017-06-02 DIAGNOSIS — G4733 Obstructive sleep apnea (adult) (pediatric): Secondary | ICD-10-CM | POA: Diagnosis not present

## 2017-06-04 DIAGNOSIS — N3946 Mixed incontinence: Secondary | ICD-10-CM | POA: Diagnosis not present

## 2017-06-04 DIAGNOSIS — G4733 Obstructive sleep apnea (adult) (pediatric): Secondary | ICD-10-CM | POA: Diagnosis not present

## 2017-06-08 ENCOUNTER — Ambulatory Visit: Payer: BLUE CROSS/BLUE SHIELD | Admitting: Neurology

## 2017-06-09 DIAGNOSIS — M5412 Radiculopathy, cervical region: Secondary | ICD-10-CM | POA: Diagnosis not present

## 2017-06-10 ENCOUNTER — Encounter: Payer: Self-pay | Admitting: Neurology

## 2017-06-10 ENCOUNTER — Ambulatory Visit (INDEPENDENT_AMBULATORY_CARE_PROVIDER_SITE_OTHER): Payer: BLUE CROSS/BLUE SHIELD | Admitting: Neurology

## 2017-06-10 VITALS — BP 106/78 | HR 95 | Ht 67.5 in | Wt 239.0 lb

## 2017-06-10 DIAGNOSIS — H8113 Benign paroxysmal vertigo, bilateral: Secondary | ICD-10-CM | POA: Diagnosis not present

## 2017-06-10 DIAGNOSIS — R51 Headache: Secondary | ICD-10-CM | POA: Diagnosis not present

## 2017-06-10 DIAGNOSIS — R519 Headache, unspecified: Secondary | ICD-10-CM

## 2017-06-10 NOTE — Progress Notes (Signed)
NEUROLOGY CONSULTATION NOTE  [name] MRN: [id number] DOB: 19-Feb-1971  Referring provider: Dr. Harrington Challenger Primary care provider: Dr. Harrington Challenger  Reason for consult:  Dizziness/headache  HISTORY OF PRESENT ILLNESS: [name] is a 46 year old female with history of Wolff-Parkinson-White syndrome status post ablation and history of degenerative disc disease of the cervical/thoracic/lumbar spine status post multiple surgeries who presents for headache and dizziness.  Onset of symptoms started last January.  When she lays down either still or turns over in bed, she reports brief dizziness, described as a sensation of head shaking back and forth, lasting 5 seconds.  She has no associated nausea or visual disturbance.  Meclizine is ineffective.  When she is laying on her side with head on the pillow, she may sometimes have a sensation that her head is heavy and sinking into the pillow.  This too also lasts just a few seconds.  She has prior history of vertigo, described as spinning with nausea.  But this is different.  MRI of brain without contrast from 08/14/16 was personally reviewed and was unremarkable.  She has history of Wolf-Parkinson-White syndrome status post ablation in 1996.  She reports no current palpitations.  Echocardiogram was reportedly normal.  ENT evaluation was normal.  She also has significant history of degenerative disc disease with chronic neck and back pain.  She has continued radicular pain down the shoulders and arms.  CT of cervical spine from 04/29/17 was personally reviewed and revealed solid arthrodesis at C5-6, degeneration of right C2-C3 facet, osteophyte complex causing left grader than right neural foraminal stenosis, and posterior disc bulge at C4-5 level.  She is scheduled for posterior cervical fusion with lateral mass fixation at C6-7.  PAST MEDICAL HISTORY: Past Medical History:  Diagnosis Date  . Anxiety   . Breast mass 02/2014  . Chronic back pain   .  Chronic constipation   . Depression   . GERD (gastroesophageal reflux disease)   . H/O cardiac radiofrequency ablation 1996  . History of kidney stones    one lodged - 2 mm.  . Hypothyroidism   . Neuromuscular disorder (HCC)    sciatica   . Rheumatoid arthritis ([place])    pt. denies, reassessed & told that wasnot accurate, OA- lumbar & cervical    . Sleep apnea    CPAP, lasted used 2 months ago  . Wolff-Parkinson-White (WPW) syndrome     PAST SURGICAL HISTORY: Past Surgical History:  Procedure Laterality Date  . CARDIAC ELECTROPHYSIOLOGY MAPPING AND ABLATION     WPW  . CERVICAL SPINE SURGERY     fusions x2  . COLONOSCOPY  04/06/2013  . ESOPHAGOGASTRODUODENOSCOPY  04/06/2013  . LUMBAR LAMINECTOMY/DECOMPRESSION MICRODISCECTOMY Right 09/02/2016   Procedure: RIGHT LUMBAR FIVE-SACRAL ONE REDO MICRODISCECTOMY;  Surgeon: Eustace Moore, MD;  Location: [place];  Service: Neurosurgery;  Laterality: Right;  . LUMBAR SPINE SURGERY     x 3  . SIGMOIDOSCOPY    . THYROIDECTOMY    . TONSILLECTOMY      MEDICATIONS: Current Outpatient Medications on File Prior to Visit  Medication Sig Dispense Refill  . amphetamine-dextroamphetamine (ADDERALL) 20 MG tablet Take 20 mg by mouth 2 (two) times daily.    . ARIPiprazole (ABILIFY) 10 MG tablet Take 5 mg by mouth daily.    Marland Kitchen buPROPion (WELLBUTRIN XL) 150 MG 24 hr tablet Take 150 mg by mouth daily before breakfast.     . calcium carbonate (TUMS - DOSED IN MG ELEMENTAL CALCIUM) 500  MG chewable tablet Chew 2 tablets by mouth at bedtime as needed for indigestion or heartburn.    . cetirizine (ZYRTEC) 10 MG tablet Take 10 mg by mouth at bedtime.    . Cholecalciferol (VITAMIN D3) 5000 units CAPS Take 5,000 Units by mouth daily.    . clonazePAM (KLONOPIN) 2 MG tablet Take 2 mg by mouth at bedtime.     Marland Kitchen doxepin (SINEQUAN) 25 MG capsule Take 50 mg by mouth at bedtime.    . DULoxetine (CYMBALTA) 30 MG capsule Take 60 mg by mouth daily.    Marland Kitchen esomeprazole  (NEXIUM) 40 MG capsule Take 40 mg by mouth daily.  1  . fluticasone (FLONASE) 50 MCG/ACT nasal spray Place 2 sprays into both nostrils at bedtime.     . gabapentin (NEURONTIN) 600 MG tablet Take 600 mg by mouth 5 (five) times daily.  1  . levothyroxine (SYNTHROID, LEVOTHROID) 150 MCG tablet Take 150 mcg by mouth daily before breakfast.    . levothyroxine (SYNTHROID, LEVOTHROID) 175 MCG tablet Take 175 mcg by mouth daily.    Marland Kitchen lidocaine (LIDODERM) 5 % Place 1 patch onto the skin at bedtime.    Marland Kitchen lubiprostone (AMITIZA) 8 MCG capsule Take 1 capsule (8 mcg total) by mouth 2 (two) times daily with a meal. 60 capsule 3  . meloxicam (MOBIC) 15 MG tablet Take 15 mg by mouth daily.     . ondansetron (ZOFRAN-ODT) 4 MG disintegrating tablet Take 4 mg by mouth every 8 (eight) hours as needed for nausea or vomiting.    Marland Kitchen oxycodone (ROXICODONE) 30 MG immediate release tablet Take 15 mg by mouth 5 (five) times daily.     . ranitidine (ZANTAC) 150 MG tablet Take 150 mg by mouth 2 (two) times daily.     . tizanidine (ZANAFLEX) 6 MG capsule Take 6 mg by mouth daily as needed for muscle spasms.    Marland Kitchen topiramate (TOPAMAX) 50 MG tablet Take 50 mg by mouth daily.    . varenicline (CHANTIX) 1 MG tablet Take 1 mg by mouth 2 (two) times daily.    Marland Kitchen VIIBRYD 40 MG TABS Take 40 mg by mouth daily.      No current facility-administered medications on file prior to visit.     ALLERGIES: Allergies  Allergen Reactions  . Darvocet [Propoxyphene N-Acetaminophen] Other (See Comments)    Heart palpitations   . Dilaudid [Hydromorphone Hcl] Other (See Comments)    Heart palpitations   . Phentermine Hcl Other (See Comments)    Heart palpitations   . Shellfish Allergy Hives    FAMILY HISTORY: Family History  Problem Relation Age of Onset  . Colon polyps Mother   . Mitral valve prolapse Mother   . High Cholesterol Mother   . Cardiomyopathy Brother     SOCIAL HISTORY: Social History   Socioeconomic History  .  Marital status: Married    Spouse name: Sheppard Coil  . Number of children: 0  . Years of education: Not on file  . Highest education level: Some college, no degree  Social Needs  . Financial resource strain: Not on file  . Food insecurity - worry: Not on file  . Food insecurity - inability: Not on file  . Transportation needs - medical: Not on file  . Transportation needs - non-medical: Not on file  Occupational History  . Occupation: retired  Tobacco Use  . Smoking status: Former Smoker    Packs/day: 0.25    Types: Cigarettes  Last attempt to quit: 09/01/2016    Years since quitting: 0.7  . Smokeless tobacco: Former Systems developer  . Tobacco comment: vapor ecig  Substance and Sexual Activity  . Alcohol use: No  . Drug use: No  . Sexual activity: Not on file  Other Topics Concern  . Not on file  Social History Narrative   Retired and disabled Presenter, broadcasting for [employer]   Married no children   Lives with husband in a 2 story home. Does not climb stairs. Has 4 birds, 2 dogs. Drinks 2 cups of coffee, ansd 2 glasses of tea a day. Unable to exercise due to DDD.    REVIEW OF SYSTEMS: Constitutional: No fevers, chills, or sweats, no generalized fatigue, change in appetite Eyes: No visual changes, double vision, eye pain Ear, nose and throat: No hearing loss, ear pain, nasal congestion, sore throat Cardiovascular: No chest pain, palpitations Respiratory:  No shortness of breath at rest or with exertion, wheezes GastrointestinaI: No nausea, vomiting, diarrhea, abdominal pain, fecal incontinence Genitourinary:  No dysuria, urinary retention or frequency Musculoskeletal:  neck pain, back pain Integumentary: No rash, pruritus, skin lesions Neurological: as above Psychiatric: No depression, insomnia, anxiety Endocrine: No palpitations, fatigue, diaphoresis, mood swings, change in appetite, change in weight, increased thirst Hematologic/Lymphatic:  No purpura, petechiae. Allergic/Immunologic: no  itchy/runny eyes, nasal congestion, recent allergic reactions, rashes  PHYSICAL EXAM: Vitals:   06/10/17 0911  BP: 106/78  Pulse: 95  SpO2: 96%   General: No acute distress.  Patient appears well-groomed.  Head:  Normocephalic/atraumatic Eyes:  fundi examined but not visualized Neck: supple, paraspinal tenderness, decreased range of motion Back: paraspinal tenderness Heart: regular rate and rhythm Lungs: Clear to auscultation bilaterally. Vascular: No carotid bruits. Neurological Exam: Mental status: alert and oriented to person, place, and time, recent and remote memory intact, fund of knowledge intact, attention and concentration intact, speech fluent and not dysarthric, language intact. Cranial nerves: CN I: not tested CN II: pupils equal, round and reactive to light, visual fields intact CN III, IV, VI:  full range of motion, no nystagmus, no ptosis CN V: facial sensation intact CN VII: upper and lower face symmetric CN VIII: hearing intact CN IX, X: gag intact, uvula midline CN XI: sternocleidomastoid and trapezius muscles intact CN XII: tongue midline Bulk & Tone: normal, no fasciculations. Motor:  5/5 throughout  Sensation:  Pinprick and vibration sensation intact. Deep Tendon Reflexes:  2+ throughout, toes downgoing.  Finger to nose testing:  Without dysmetria.  Heel to shin:  Without dysmetria.  Gait:  Normal station and stride.  Able to turn and tandem walk. Romberg negative.  IMPRESSION: Dizziness.  Probably still benign positional vertigo.  It is usually positional, lasting just seconds.  While it is not a spinning sensation, it is a sensation of movement.  I don't have an explanation for the brief heaviness in her head but it is not consistent with a headache.  Ideally, the Epley maneuver or vestibular rehab would be the best therapy.  However, given her significant cervical and lumbar history, this is likely not an option.  Since it is brief, acute medications  such as meclizine or clonazepam would be indicated.  No further recommendations.  Taylor Clines, DO  CC:  C. Melinda Crutch, MD

## 2017-06-11 DIAGNOSIS — I456 Pre-excitation syndrome: Secondary | ICD-10-CM | POA: Diagnosis not present

## 2017-06-11 DIAGNOSIS — R42 Dizziness and giddiness: Secondary | ICD-10-CM | POA: Diagnosis not present

## 2017-06-11 DIAGNOSIS — R002 Palpitations: Secondary | ICD-10-CM | POA: Diagnosis not present

## 2017-06-16 DIAGNOSIS — M546 Pain in thoracic spine: Secondary | ICD-10-CM | POA: Diagnosis not present

## 2017-06-16 DIAGNOSIS — M545 Low back pain: Secondary | ICD-10-CM | POA: Diagnosis not present

## 2017-06-16 DIAGNOSIS — M542 Cervicalgia: Secondary | ICD-10-CM | POA: Diagnosis not present

## 2017-06-16 DIAGNOSIS — F1120 Opioid dependence, uncomplicated: Secondary | ICD-10-CM | POA: Diagnosis not present

## 2017-06-16 DIAGNOSIS — M25512 Pain in left shoulder: Secondary | ICD-10-CM | POA: Diagnosis not present

## 2017-06-16 DIAGNOSIS — G894 Chronic pain syndrome: Secondary | ICD-10-CM | POA: Diagnosis not present

## 2017-06-16 DIAGNOSIS — Z79891 Long term (current) use of opiate analgesic: Secondary | ICD-10-CM | POA: Diagnosis not present

## 2017-06-16 DIAGNOSIS — G8929 Other chronic pain: Secondary | ICD-10-CM | POA: Diagnosis not present

## 2017-06-17 DIAGNOSIS — M5124 Other intervertebral disc displacement, thoracic region: Secondary | ICD-10-CM | POA: Diagnosis not present

## 2017-06-17 DIAGNOSIS — M546 Pain in thoracic spine: Secondary | ICD-10-CM | POA: Diagnosis not present

## 2017-06-23 ENCOUNTER — Ambulatory Visit: Payer: BLUE CROSS/BLUE SHIELD | Admitting: Neurology

## 2017-06-24 NOTE — Pre-Procedure Instructions (Signed)
[name]  06/24/2017      [id number] - [place], [place] - [address] [place]  [address] Phone: [phone] Fax: [phone]    Your procedure is scheduled on Thursday, July 01, 2017  Report to [facility] Admitting Entrance "A" at 5:30AM   Call this number if you have problems the morning of surgery:  [phone]   Remember:  Do not eat food or drink liquids after midnight.  Take these medicines the morning of surgery with A SIP OF WATER: DULoxetine (CYMBALTA), Esomeprazole (NEXIUM), Levothyroxine (SYNTHROID, LEVOTHROID) , Topiramate (TOPAMAX), VIIBRYD, JUNEL, and Brexpiprazole (REXULTI). If needed Cetirizine (ZYRTEC) for allergies and OxyCODONE (ROXICODONE) for pain.  As of today, stop taking all Aspirins, Vitamins, Fish oils, and Herbal medications. Also stop all NSAIDS i.e. Advil, Ibuprofen, Motrin, Aleve, Anaprox, Naproxen, BC and Goody Powders.    Do not wear jewelry, make-up or nail polish.  Do not wear lotions, powders, perfumes, or deodorant.  Do not shave 48 hours prior to surgery.    Do not bring valuables to the hospital.  [facility] is not responsible for any belongings or valuables.  Contacts, dentures or bridgework may not be worn into surgery.  Leave your suitcase in the car.  After surgery it may be brought to your room.  For patients admitted to the hospital, discharge time will be determined by your treatment team.  Patients discharged the day of surgery will not be allowed to drive home.   Special instructions: [place]- Preparing For Surgery  Before surgery, you can play an important role. Because skin is not sterile, your skin needs to be as free of germs as possible. You can reduce the number of germs on your skin by washing with CHG (chlorahexidine gluconate) Soap before surgery.  CHG is an antiseptic cleaner which kills germs and bonds with the skin to continue killing germs even after  washing.  Please do not use if you have an allergy to CHG or antibacterial soaps. If your skin becomes reddened/irritated stop using the CHG.  Do not shave (including legs and underarms) for at least 48 hours prior to first CHG shower. It is OK to shave your face.  Please follow these instructions carefully.   1. Shower the NIGHT BEFORE SURGERY and the MORNING OF SURGERY with CHG.   2. If you chose to wash your hair, wash your hair first as usual with your normal shampoo.  3. After you shampoo, rinse your hair and body thoroughly to remove the shampoo.  4. Use CHG as you would any other liquid soap. You can apply CHG directly to the skin and wash gently with a scrungie or a clean washcloth.   5. Apply the CHG Soap to your body ONLY FROM THE NECK DOWN.  Do not use on open wounds or open sores. Avoid contact with your eyes, ears, mouth and genitals (private parts). Wash Face and genitals (private parts)  with your normal soap.  6. Wash thoroughly, paying special attention to the area where your surgery will be performed.  7. Thoroughly rinse your body with warm water from the neck down.  8. DO NOT shower/wash with your normal soap after using and rinsing off the CHG Soap.  9. Pat yourself dry with a CLEAN TOWEL.  10. Wear CLEAN PAJAMAS to bed the night before surgery, wear comfortable clothes the morning of surgery  11. Place CLEAN SHEETS on your bed the night of your  first shower and DO NOT SLEEP WITH PETS.  Day of Surgery: Do not apply any deodorants/lotions. Please wear clean clothes to the hospital/surgery center.    Please read over the following fact sheets that you were given. Pain Booklet, Coughing and Deep Breathing, Blood Transfusion Information, MRSA Information and Surgical Site Infection Prevention

## 2017-06-25 ENCOUNTER — Inpatient Hospital Stay (HOSPITAL_COMMUNITY)
Admission: RE | Admit: 2017-06-25 | Discharge: 2017-06-25 | Disposition: A | Discharge status: Home / Self Care | Payer: BLUE CROSS/BLUE SHIELD | Source: Ambulatory Visit

## 2017-06-28 ENCOUNTER — Other Ambulatory Visit: Payer: Self-pay

## 2017-06-28 ENCOUNTER — Ambulatory Visit (HOSPITAL_COMMUNITY)
Admission: RE | Admit: 2017-06-28 | Discharge: 2017-06-28 | Disposition: A | Discharge status: Home / Self Care | Payer: BLUE CROSS/BLUE SHIELD | Source: Ambulatory Visit | Attending: Neurological Surgery | Admitting: Neurological Surgery

## 2017-06-28 ENCOUNTER — Encounter (HOSPITAL_COMMUNITY): Payer: Self-pay | Admitting: *Deleted

## 2017-06-28 ENCOUNTER — Encounter (HOSPITAL_COMMUNITY)
Admission: RE | Admit: 2017-06-28 | Discharge: 2017-06-28 | Disposition: A | Discharge status: Home / Self Care | Payer: BLUE CROSS/BLUE SHIELD | Source: Ambulatory Visit | Attending: Neurological Surgery | Admitting: Neurological Surgery

## 2017-06-28 DIAGNOSIS — M546 Pain in thoracic spine: Secondary | ICD-10-CM | POA: Diagnosis not present

## 2017-06-28 DIAGNOSIS — Z01818 Encounter for other preprocedural examination: Secondary | ICD-10-CM | POA: Insufficient documentation

## 2017-06-28 DIAGNOSIS — Z87891 Personal history of nicotine dependence: Secondary | ICD-10-CM | POA: Diagnosis not present

## 2017-06-28 DIAGNOSIS — Z01812 Encounter for preprocedural laboratory examination: Secondary | ICD-10-CM | POA: Diagnosis not present

## 2017-06-28 DIAGNOSIS — S129XXA Fracture of neck, unspecified, initial encounter: Secondary | ICD-10-CM

## 2017-06-28 HISTORY — DX: Headache: R51

## 2017-06-28 HISTORY — DX: Headache, unspecified: R51.9

## 2017-06-28 HISTORY — DX: Thyrotoxicosis, unspecified without thyrotoxic crisis or storm: E05.90

## 2017-06-28 HISTORY — DX: Fibromyalgia: M79.7

## 2017-06-28 LAB — BASIC METABOLIC PANEL
Anion gap: 7 (ref 5–15)
BUN: 5 mg/dL — AB (ref 6–20)
CO2: 23 mmol/L (ref 22–32)
CREATININE: 0.97 mg/dL (ref 0.44–1.00)
Calcium: 9.1 mg/dL (ref 8.9–10.3)
Chloride: 106 mmol/L (ref 101–111)
GFR calc Af Amer: >60 mL/min (ref >60)
GLUCOSE: 94 mg/dL (ref 65–99)
POTASSIUM: 4.1 mmol/L (ref 3.5–5.1)
Sodium: 136 mmol/L (ref 135–145)

## 2017-06-28 LAB — CBC WITH DIFFERENTIAL/PLATELET
BASOS ABS: 0 10*3/uL (ref 0.0–0.1)
Basophils Relative: 0 %
EOS PCT: 2 %
Eosinophils Absolute: 0.2 10*3/uL (ref 0.0–0.7)
HCT: 41.2 % (ref 36.0–46.0)
Hemoglobin: 13.1 g/dL (ref 12.0–15.0)
LYMPHS PCT: 22 %
Lymphs Abs: 2.2 10*3/uL (ref 0.7–4.0)
MCH: 27.1 pg (ref 26.0–34.0)
MCHC: 31.8 g/dL (ref 30.0–36.0)
MCV: 85.1 fL (ref 78.0–100.0)
MONO ABS: 0.5 10*3/uL (ref 0.1–1.0)
Monocytes Relative: 5 %
Neutro Abs: 7.1 10*3/uL (ref 1.7–7.7)
Neutrophils Relative %: 71 %
PLATELETS: 284 10*3/uL (ref 150–400)
RBC: 4.84 MIL/uL (ref 3.87–5.11)
RDW: 14.7 % (ref 11.5–15.5)
WBC: 10 10*3/uL (ref 4.0–10.5)

## 2017-06-28 LAB — HCG, SERUM, QUALITATIVE: PREG SERUM: NEGATIVE

## 2017-06-28 LAB — SURGICAL PCR SCREEN
MRSA, PCR: NEGATIVE
STAPHYLOCOCCUS AUREUS: POSITIVE — AB

## 2017-06-28 LAB — ABO/RH: ABO/RH(D): O NEG

## 2017-06-28 LAB — TYPE AND SCREEN
ABO/RH(D): O NEG
Antibody Screen: NEGATIVE

## 2017-06-28 LAB — PROTIME-INR
INR: 0.97
Prothrombin Time: 12.8 seconds (ref 11.4–15.2)

## 2017-06-28 NOTE — Progress Notes (Signed)
PCP: C. Harle Battiest Cardiologist: Remo Lipps rohrbeck--request ekg tracing/echo  Will request sleep study from Dr. Maxwell Caul @ [facility]

## 2017-06-30 ENCOUNTER — Encounter (HOSPITAL_COMMUNITY): Payer: Self-pay

## 2017-06-30 NOTE — Progress Notes (Signed)
Anesthesia Chart Review: Patient is a 47 year old female scheduled for posterior cervical fusion with lateral mass fixation C6-C7 on 07/01/17 by Dr. Sherley Bounds.  History includes former smoker (quit 09/01/16), WPW s/p cardiac radiofrequency ablation '96 ([place]), anxiety, depression, chronic back pain, GERD, fibromyalgia, hyperthyroidism s/p thyroidectomy (now treated for hypothyroidism), headaches, BPPV, OSA (inconsistent use of CPAP), arthritis (reportedly RA ruled out), right L5-S1 redo microdiscectomy 09/02/16, L4-5 microdiscectomy 07/02/05 and right hemilaminectomy 02/25/11, ACDF C5-6 02/03/06. BMI is consistent with obesity.   - PCP is C. Melinda Crutch. - [facility] Last visit 06/11/17 for palpitation follow-up. Prior tests showed a few benign PVCs and echo with normal LVF and no significant valvular abnormalities. He did not recommend any further work-up and saw "no cardiac contraindication to any upcoming surgery."  - Neurologist is Dr. Metta Clines.  Meds include Adderall, Rexulti, Zyrtec, clonazepam, doxepin, Cymbalta, Nexium, Flonase, Neurontin, Junel, levothyroxine, Lidoderm, magnesium, Zofran, Roxicodone, potassium, Belsomra, Zanaflex, Topamax, Valtrex, Viibryd.  BP 123/69   Pulse 100   Temp 36.8 C   Resp 20   Ht 5' 7.5" (1.715 m)   Wt 239 lb 1.6 oz (108.5 kg)   LMP 02/25/2017   SpO2 97%   BMI 36.90 kg/m   EKG 01/01/17 (Dr. Marijo File): SR, short (PRi 108 ms).  Echo 02/01/17 (Dr. Marijo File): Conclusion: 1. Normal left ventricular size and systolic function. 2. EF 55-60%. 3. Trace tricuspid regurgitation.  24 hour Holter monitor 03/27/15 (as outlined by Dr. Loni Beckwith, [place]):  A 24 hour Holter monitor revealed rare PVCs with sinus tachycardia no significant arrhythmias were noted   CT neck 04/29/17: IMPRESSION: 1. Solid arthrodesis at C5-C6, and also ankylosis of the chronically degenerated right C2-C3 facet. 2. But no  arthrodesis at the prior C6-C7 fusion level. Circumferential disc osteophyte complex suspected at that level with left greater than right C7 neural foraminal stenosis. 3. Posterior disc bulging at the adjacent C4-C5 level may have progressed since the 2017 MRI. 4. Mild cervical neural foraminal stenosis elsewhere.  Preoperative labs noted. Serum pregnancy test was negative.   If no acute changes then I anticipate that she can proceed as planned.  [facility] Short Stay Center/Anesthesiology Phone [phone] 06/30/2017 9:34 AM

## 2017-07-01 ENCOUNTER — Inpatient Hospital Stay (HOSPITAL_COMMUNITY): Payer: BLUE CROSS/BLUE SHIELD | Admitting: Certified Registered Nurse Anesthetist

## 2017-07-01 ENCOUNTER — Inpatient Hospital Stay (HOSPITAL_COMMUNITY): Payer: BLUE CROSS/BLUE SHIELD

## 2017-07-01 ENCOUNTER — Other Ambulatory Visit: Payer: Self-pay

## 2017-07-01 ENCOUNTER — Inpatient Hospital Stay (HOSPITAL_COMMUNITY): Payer: BLUE CROSS/BLUE SHIELD | Admitting: Vascular Surgery

## 2017-07-01 ENCOUNTER — Encounter (HOSPITAL_COMMUNITY)
Admission: RE | Disposition: A | Discharge status: Home / Self Care | Payer: Self-pay | Source: Ambulatory Visit | Attending: Neurological Surgery

## 2017-07-01 ENCOUNTER — Inpatient Hospital Stay (HOSPITAL_COMMUNITY)
Admission: RE | Admit: 2017-07-01 | Discharge: 2017-07-02 | DRG: 473 | Disposition: A | Discharge status: Home / Self Care | Payer: BLUE CROSS/BLUE SHIELD | Source: Ambulatory Visit | Attending: Neurological Surgery | Admitting: Neurological Surgery

## 2017-07-01 ENCOUNTER — Encounter (HOSPITAL_COMMUNITY): Payer: Self-pay | Admitting: Anesthesiology

## 2017-07-01 DIAGNOSIS — Z87442 Personal history of urinary calculi: Secondary | ICD-10-CM | POA: Diagnosis not present

## 2017-07-01 DIAGNOSIS — G4730 Sleep apnea, unspecified: Secondary | ICD-10-CM | POA: Diagnosis not present

## 2017-07-01 DIAGNOSIS — Z91013 Allergy to seafood: Secondary | ICD-10-CM

## 2017-07-01 DIAGNOSIS — M069 Rheumatoid arthritis, unspecified: Secondary | ICD-10-CM | POA: Diagnosis not present

## 2017-07-01 DIAGNOSIS — E739 Lactose intolerance, unspecified: Secondary | ICD-10-CM | POA: Diagnosis present

## 2017-07-01 DIAGNOSIS — K219 Gastro-esophageal reflux disease without esophagitis: Secondary | ICD-10-CM | POA: Diagnosis present

## 2017-07-01 DIAGNOSIS — Z885 Allergy status to narcotic agent status: Secondary | ICD-10-CM

## 2017-07-01 DIAGNOSIS — M79603 Pain in arm, unspecified: Secondary | ICD-10-CM | POA: Diagnosis not present

## 2017-07-01 DIAGNOSIS — K5909 Other constipation: Secondary | ICD-10-CM | POA: Diagnosis not present

## 2017-07-01 DIAGNOSIS — M797 Fibromyalgia: Secondary | ICD-10-CM | POA: Diagnosis not present

## 2017-07-01 DIAGNOSIS — Z87891 Personal history of nicotine dependence: Secondary | ICD-10-CM | POA: Diagnosis not present

## 2017-07-01 DIAGNOSIS — M542 Cervicalgia: Secondary | ICD-10-CM | POA: Diagnosis not present

## 2017-07-01 DIAGNOSIS — Z419 Encounter for procedure for purposes other than remedying health state, unspecified: Secondary | ICD-10-CM

## 2017-07-01 DIAGNOSIS — M4322 Fusion of spine, cervical region: Secondary | ICD-10-CM | POA: Diagnosis not present

## 2017-07-01 DIAGNOSIS — M4802 Spinal stenosis, cervical region: Secondary | ICD-10-CM | POA: Diagnosis not present

## 2017-07-01 DIAGNOSIS — I456 Pre-excitation syndrome: Secondary | ICD-10-CM | POA: Diagnosis not present

## 2017-07-01 DIAGNOSIS — M960 Pseudarthrosis after fusion or arthrodesis: Secondary | ICD-10-CM | POA: Diagnosis not present

## 2017-07-01 DIAGNOSIS — M50223 Other cervical disc displacement at C6-C7 level: Secondary | ICD-10-CM | POA: Diagnosis not present

## 2017-07-01 DIAGNOSIS — Z79899 Other long term (current) drug therapy: Secondary | ICD-10-CM | POA: Diagnosis not present

## 2017-07-01 DIAGNOSIS — Z888 Allergy status to other drugs, medicaments and biological substances status: Secondary | ICD-10-CM

## 2017-07-01 DIAGNOSIS — M479 Spondylosis, unspecified: Secondary | ICD-10-CM | POA: Diagnosis present

## 2017-07-01 DIAGNOSIS — Z981 Arthrodesis status: Secondary | ICD-10-CM

## 2017-07-01 DIAGNOSIS — Z79890 Hormone replacement therapy (postmenopausal): Secondary | ICD-10-CM

## 2017-07-01 HISTORY — PX: POSTERIOR CERVICAL FUSION/FORAMINOTOMY: SHX5038

## 2017-07-01 HISTORY — DX: Fracture of neck, unspecified, initial encounter: S12.9XXA

## 2017-07-01 SURGERY — POSTERIOR CERVICAL FUSION/FORAMINOTOMY LEVEL 1
Anesthesia: General

## 2017-07-01 MED ORDER — VILAZODONE HCL 40 MG PO TABS
40.0000 mg | ORAL_TABLET | Freq: Every day | ORAL | Status: DC
  Filled 2017-07-01: qty 1

## 2017-07-01 MED ORDER — ACETAMINOPHEN 10 MG/ML IV SOLN
1000.0000 mg | INTRAVENOUS | Status: AC
  Administered 2017-07-01: 1000 mg via INTRAVENOUS
  Filled 2017-07-01: qty 100

## 2017-07-01 MED ORDER — PHENOL 1.4 % MT LIQD: 1.0000 | OROMUCOSAL | Status: DC | PRN

## 2017-07-01 MED ORDER — SUGAMMADEX SODIUM 200 MG/2ML IV SOLN
INTRAVENOUS | Status: DC | PRN
  Administered 2017-07-01: 300 mg via INTRAVENOUS

## 2017-07-01 MED ORDER — THROMBIN (RECOMBINANT) 20000 UNITS EX SOLR
CUTANEOUS | Status: AC
  Filled 2017-07-01: qty 20000

## 2017-07-01 MED ORDER — GABAPENTIN 600 MG PO TABS: 3000.0000 mg | ORAL_TABLET | Freq: Every day | ORAL | Status: DC

## 2017-07-01 MED ORDER — ROCURONIUM BROMIDE 10 MG/ML (PF) SYRINGE
PREFILLED_SYRINGE | INTRAVENOUS | Status: AC
  Filled 2017-07-01: qty 5

## 2017-07-01 MED ORDER — 0.9 % SODIUM CHLORIDE (POUR BTL) OPTIME
TOPICAL | Status: DC | PRN
  Administered 2017-07-01: 1000 mL

## 2017-07-01 MED ORDER — FENTANYL CITRATE (PF) 100 MCG/2ML IJ SOLN
INTRAMUSCULAR | Status: AC
  Administered 2017-07-01: 50 ug via INTRAVENOUS
  Filled 2017-07-01: qty 2

## 2017-07-01 MED ORDER — FENTANYL CITRATE (PF) 250 MCG/5ML IJ SOLN
INTRAMUSCULAR | Status: AC
  Filled 2017-07-01: qty 5

## 2017-07-01 MED ORDER — DEXMEDETOMIDINE HCL 200 MCG/2ML IV SOLN
INTRAVENOUS | Status: DC | PRN
  Administered 2017-07-01 (×3): 20 ug via INTRAVENOUS

## 2017-07-01 MED ORDER — MIDAZOLAM HCL 2 MG/2ML IJ SOLN
INTRAMUSCULAR | Status: AC
  Filled 2017-07-01: qty 2

## 2017-07-01 MED ORDER — LIDOCAINE 2% (20 MG/ML) 5 ML SYRINGE
INTRAMUSCULAR | Status: DC | PRN
  Administered 2017-07-01: 100 mg via INTRAVENOUS

## 2017-07-01 MED ORDER — VANCOMYCIN HCL 1000 MG IV SOLR
INTRAVENOUS | Status: DC | PRN
  Administered 2017-07-01: 1000 mg

## 2017-07-01 MED ORDER — LACTATED RINGERS IV SOLN
INTRAVENOUS | Status: DC
  Administered 2017-07-01 (×2): via INTRAVENOUS

## 2017-07-01 MED ORDER — DEXAMETHASONE SODIUM PHOSPHATE 10 MG/ML IJ SOLN
INTRAMUSCULAR | Status: DC | PRN
  Administered 2017-07-01: 10 mg via INTRAVENOUS

## 2017-07-01 MED ORDER — DULOXETINE HCL 30 MG PO CPEP: 60.0000 mg | ORAL_CAPSULE | Freq: Every day | ORAL | Status: DC

## 2017-07-01 MED ORDER — FENTANYL CITRATE (PF) 100 MCG/2ML IJ SOLN
INTRAMUSCULAR | Status: AC
  Filled 2017-07-01: qty 2

## 2017-07-01 MED ORDER — CEFAZOLIN SODIUM-DEXTROSE 1-4 GM/50ML-% IV SOLN
1.0000 g | Freq: Three times a day (TID) | INTRAVENOUS | Status: AC
  Administered 2017-07-01 – 2017-07-02 (×2): 1 g via INTRAVENOUS
  Filled 2017-07-01 (×2): qty 50

## 2017-07-01 MED ORDER — METHOCARBAMOL 500 MG PO TABS
ORAL_TABLET | ORAL | Status: AC
  Filled 2017-07-01: qty 1

## 2017-07-01 MED ORDER — MIDAZOLAM HCL 5 MG/5ML IJ SOLN
INTRAMUSCULAR | Status: DC | PRN
  Administered 2017-07-01 (×2): 2 mg via INTRAVENOUS

## 2017-07-01 MED ORDER — LIDOCAINE 2% (20 MG/ML) 5 ML SYRINGE
INTRAMUSCULAR | Status: AC
  Filled 2017-07-01: qty 5

## 2017-07-01 MED ORDER — CHLORHEXIDINE GLUCONATE CLOTH 2 % EX PADS: 6.0000 | MEDICATED_PAD | Freq: Once | CUTANEOUS | Status: DC

## 2017-07-01 MED ORDER — SODIUM CHLORIDE 0.9% FLUSH
3.0000 mL | Freq: Two times a day (BID) | INTRAVENOUS | Status: DC
  Administered 2017-07-01: 3 mL via INTRAVENOUS

## 2017-07-01 MED ORDER — SUVOREXANT 20 MG PO TABS
20.0000 mg | ORAL_TABLET | Freq: Every day | ORAL | Status: DC
  Filled 2017-07-01: qty 1

## 2017-07-01 MED ORDER — OXYCODONE HCL 5 MG PO TABS
15.0000 mg | ORAL_TABLET | ORAL | Status: DC
  Administered 2017-07-01 – 2017-07-02 (×3): 15 mg via ORAL
  Filled 2017-07-01 (×4): qty 3

## 2017-07-01 MED ORDER — METHOCARBAMOL 1000 MG/10ML IJ SOLN: 500.0000 mg | Freq: Four times a day (QID) | INTRAVENOUS | Status: DC | PRN

## 2017-07-01 MED ORDER — DEXMEDETOMIDINE HCL IN NACL 200 MCG/50ML IV SOLN
INTRAVENOUS | Status: AC
  Filled 2017-07-01: qty 50

## 2017-07-01 MED ORDER — SODIUM CHLORIDE 0.9 % IR SOLN
Status: DC | PRN
  Administered 2017-07-01: 13:00:00

## 2017-07-01 MED ORDER — DEXAMETHASONE SODIUM PHOSPHATE 10 MG/ML IJ SOLN: 10.0000 mg | INTRAMUSCULAR | Status: DC

## 2017-07-01 MED ORDER — METHOCARBAMOL 500 MG PO TABS
500.0000 mg | ORAL_TABLET | Freq: Four times a day (QID) | ORAL | Status: DC | PRN
  Administered 2017-07-01 (×2): 500 mg via ORAL
  Filled 2017-07-01: qty 1

## 2017-07-01 MED ORDER — DOXEPIN HCL 100 MG PO CAPS
100.0000 mg | ORAL_CAPSULE | Freq: Every day | ORAL | Status: DC
  Administered 2017-07-01: 100 mg via ORAL
  Filled 2017-07-01: qty 1

## 2017-07-01 MED ORDER — PROPOFOL 10 MG/ML IV BOLUS
INTRAVENOUS | Status: AC
  Filled 2017-07-01: qty 20

## 2017-07-01 MED ORDER — ACETAMINOPHEN 325 MG PO TABS: 650.0000 mg | ORAL_TABLET | ORAL | Status: DC | PRN

## 2017-07-01 MED ORDER — FENTANYL CITRATE (PF) 100 MCG/2ML IJ SOLN
25.0000 ug | INTRAMUSCULAR | Status: DC | PRN
  Administered 2017-07-01 (×3): 50 ug via INTRAVENOUS

## 2017-07-01 MED ORDER — CLONAZEPAM 0.5 MG PO TABS
2.0000 mg | ORAL_TABLET | Freq: Three times a day (TID) | ORAL | Status: DC | PRN
  Filled 2017-07-01: qty 4

## 2017-07-01 MED ORDER — SODIUM CHLORIDE 0.9% FLUSH: 3.0000 mL | INTRAVENOUS | Status: DC | PRN

## 2017-07-01 MED ORDER — ONDANSETRON HCL 4 MG/2ML IJ SOLN: 4.0000 mg | Freq: Four times a day (QID) | INTRAMUSCULAR | Status: DC | PRN

## 2017-07-01 MED ORDER — ACETAMINOPHEN 650 MG RE SUPP: 650.0000 mg | RECTAL | Status: DC | PRN

## 2017-07-01 MED ORDER — ONDANSETRON 4 MG PO TBDP: 4.0000 mg | ORAL_TABLET | Freq: Three times a day (TID) | ORAL | Status: DC | PRN

## 2017-07-01 MED ORDER — AMPHETAMINE-DEXTROAMPHETAMINE 10 MG PO TABS: 20.0000 mg | ORAL_TABLET | Freq: Three times a day (TID) | ORAL | Status: DC

## 2017-07-01 MED ORDER — MORPHINE SULFATE (PF) 4 MG/ML IV SOLN
2.0000 mg | INTRAVENOUS | Status: DC | PRN
  Administered 2017-07-01 (×2): 2 mg via INTRAVENOUS
  Filled 2017-07-01 (×2): qty 1

## 2017-07-01 MED ORDER — BREXPIPRAZOLE 2 MG PO TABS
2.0000 mg | ORAL_TABLET | Freq: Every day | ORAL | Status: DC
  Filled 2017-07-01: qty 1

## 2017-07-01 MED ORDER — TOPIRAMATE 25 MG PO TABS
50.0000 mg | ORAL_TABLET | Freq: Every day | ORAL | Status: DC
  Filled 2017-07-01: qty 2

## 2017-07-01 MED ORDER — FENTANYL CITRATE (PF) 100 MCG/2ML IJ SOLN
INTRAMUSCULAR | Status: DC | PRN
  Administered 2017-07-01: 100 ug via INTRAVENOUS
  Administered 2017-07-01: 50 ug via INTRAVENOUS
  Administered 2017-07-01: 100 ug via INTRAVENOUS
  Administered 2017-07-01 (×2): 50 ug via INTRAVENOUS

## 2017-07-01 MED ORDER — POTASSIUM CHLORIDE IN NACL 20-0.9 MEQ/L-% IV SOLN: INTRAVENOUS | Status: DC

## 2017-07-01 MED ORDER — CELECOXIB 200 MG PO CAPS
200.0000 mg | ORAL_CAPSULE | Freq: Two times a day (BID) | ORAL | Status: DC
  Administered 2017-07-01 (×2): 200 mg via ORAL
  Filled 2017-07-01 (×2): qty 1

## 2017-07-01 MED ORDER — ROCURONIUM BROMIDE 10 MG/ML (PF) SYRINGE
PREFILLED_SYRINGE | INTRAVENOUS | Status: DC | PRN
  Administered 2017-07-01: 80 mg via INTRAVENOUS
  Administered 2017-07-01: 10 mg via INTRAVENOUS

## 2017-07-01 MED ORDER — PANTOPRAZOLE SODIUM 40 MG PO TBEC: 40.0000 mg | DELAYED_RELEASE_TABLET | Freq: Every day | ORAL | Status: DC

## 2017-07-01 MED ORDER — SUGAMMADEX SODIUM 500 MG/5ML IV SOLN
INTRAVENOUS | Status: AC
  Filled 2017-07-01: qty 5

## 2017-07-01 MED ORDER — MENTHOL 3 MG MT LOZG: 1.0000 | LOZENGE | OROMUCOSAL | Status: DC | PRN

## 2017-07-01 MED ORDER — ONDANSETRON HCL 4 MG PO TABS: 4.0000 mg | ORAL_TABLET | Freq: Four times a day (QID) | ORAL | Status: DC | PRN

## 2017-07-01 MED ORDER — DEXAMETHASONE SODIUM PHOSPHATE 10 MG/ML IJ SOLN
INTRAMUSCULAR | Status: AC
  Filled 2017-07-01: qty 1

## 2017-07-01 MED ORDER — BACITRACIN ZINC 500 UNIT/GM EX OINT
TOPICAL_OINTMENT | CUTANEOUS | Status: AC
Start: 2017-07-01 — End: ?
  Filled 2017-07-01: qty 28.35

## 2017-07-01 MED ORDER — CEFAZOLIN SODIUM-DEXTROSE 2-4 GM/100ML-% IV SOLN
INTRAVENOUS | Status: AC
  Filled 2017-07-01: qty 100

## 2017-07-01 MED ORDER — THROMBIN (RECOMBINANT) 5000 UNITS EX SOLR
CUTANEOUS | Status: AC
  Filled 2017-07-01: qty 5000

## 2017-07-01 MED ORDER — ONDANSETRON HCL 4 MG/2ML IJ SOLN
INTRAMUSCULAR | Status: AC
  Filled 2017-07-01: qty 2

## 2017-07-01 MED ORDER — KETOROLAC TROMETHAMINE 30 MG/ML IJ SOLN
INTRAMUSCULAR | Status: DC | PRN
  Administered 2017-07-01: 30 mg via INTRAVENOUS

## 2017-07-01 MED ORDER — CEFAZOLIN SODIUM-DEXTROSE 2-4 GM/100ML-% IV SOLN
2.0000 g | INTRAVENOUS | Status: AC
  Administered 2017-07-01: 2 g via INTRAVENOUS

## 2017-07-01 MED ORDER — NORETHINDRONE ACET-ETHINYL EST 1.5-30 MG-MCG PO TABS: 1.0000 | ORAL_TABLET | Freq: Every day | ORAL | Status: DC

## 2017-07-01 MED ORDER — GABAPENTIN 600 MG PO TABS
3000.0000 mg | ORAL_TABLET | Freq: Every day | ORAL | Status: DC
  Administered 2017-07-01: 3000 mg via ORAL
  Filled 2017-07-01: qty 5

## 2017-07-01 MED ORDER — BUPIVACAINE HCL (PF) 0.25 % IJ SOLN
INTRAMUSCULAR | Status: DC | PRN
  Administered 2017-07-01: 5 mL

## 2017-07-01 MED ORDER — VANCOMYCIN HCL 1000 MG IV SOLR
INTRAVENOUS | Status: AC
  Filled 2017-07-01: qty 1000

## 2017-07-01 MED ORDER — BUPIVACAINE HCL (PF) 0.25 % IJ SOLN
INTRAMUSCULAR | Status: AC
  Filled 2017-07-01: qty 30

## 2017-07-01 MED ORDER — ZOLPIDEM TARTRATE 5 MG PO TABS: 5.0000 mg | ORAL_TABLET | Freq: Every evening | ORAL | Status: DC | PRN

## 2017-07-01 MED ORDER — VALACYCLOVIR HCL 500 MG PO TABS: 2000.0000 mg | ORAL_TABLET | Freq: Two times a day (BID) | ORAL | Status: DC | PRN

## 2017-07-01 MED ORDER — PROPOFOL 10 MG/ML IV BOLUS
INTRAVENOUS | Status: DC | PRN
  Administered 2017-07-01: 150 mg via INTRAVENOUS

## 2017-07-01 MED ORDER — SODIUM CHLORIDE 0.9 % IV SOLN: 250.0000 mL | INTRAVENOUS | Status: DC

## 2017-07-01 MED ORDER — LEVOTHYROXINE SODIUM 175 MCG PO TABS
175.0000 ug | ORAL_TABLET | Freq: Every day | ORAL | Status: DC
  Filled 2017-07-01: qty 1

## 2017-07-01 MED ORDER — SENNA 8.6 MG PO TABS
1.0000 | ORAL_TABLET | Freq: Two times a day (BID) | ORAL | Status: DC
  Administered 2017-07-01 (×2): 8.6 mg via ORAL
  Filled 2017-07-01 (×2): qty 1

## 2017-07-01 MED ORDER — ONDANSETRON HCL 4 MG/2ML IJ SOLN
INTRAMUSCULAR | Status: DC | PRN
  Administered 2017-07-01: 4 mg via INTRAVENOUS

## 2017-07-01 SURGICAL SUPPLY — 62 items
ADH SKN CLS LQ APL DERMABOND (GAUZE/BANDAGES/DRESSINGS) ×2
APL SKNCLS STERI-STRIP NONHPOA (GAUZE/BANDAGES/DRESSINGS) ×1
BAG DECANTER FOR FLEXI CONT (MISCELLANEOUS) ×2 IMPLANT
BENZOIN TINCTURE PRP APPL 2/3 (GAUZE/BANDAGES/DRESSINGS) ×2 IMPLANT
BIT DRILL SCRW 3.5 (BIT) ×1 IMPLANT
BLADE CLIPPER SURG (BLADE) ×1 IMPLANT
BUR MATCHSTICK NEURO 3.0 LAGG (BURR) ×1 IMPLANT
CANISTER SUCT 3000ML PPV (MISCELLANEOUS) ×2 IMPLANT
CAP LOCKING (Cap) IMPLANT
CARTRIDGE OIL MAESTRO DRILL (MISCELLANEOUS) ×1 IMPLANT
DERMABOND ADHESIVE PROPEN (GAUZE/BANDAGES/DRESSINGS) ×2
DERMABOND ADVANCED .7 DNX6 (GAUZE/BANDAGES/DRESSINGS) IMPLANT
DIFFUSER DRILL AIR PNEUMATIC (MISCELLANEOUS) ×2 IMPLANT
DRAPE C-ARM 42X72 X-RAY (DRAPES) ×4 IMPLANT
DRAPE LAPAROTOMY 100X72 PEDS (DRAPES) ×2 IMPLANT
DRAPE POUCH INSTRU U-SHP 10X18 (DRAPES) ×2 IMPLANT
DRSG OPSITE POSTOP 4X6 (GAUZE/BANDAGES/DRESSINGS) ×1 IMPLANT
DURAPREP 26ML APPLICATOR (WOUND CARE) ×2 IMPLANT
ELECT REM PT RETURN 9FT ADLT (ELECTROSURGICAL) ×2
ELECTRODE REM PT RTRN 9FT ADLT (ELECTROSURGICAL) ×1 IMPLANT
EVACUATOR 1/8 PVC DRAIN (DRAIN) IMPLANT
GAUZE SPONGE 4X4 16PLY XRAY LF (GAUZE/BANDAGES/DRESSINGS) IMPLANT
GLOVE BIO SURGEON STRL SZ7 (GLOVE) ×1 IMPLANT
GLOVE BIO SURGEON STRL SZ8 (GLOVE) ×2 IMPLANT
GLOVE BIOGEL PI IND STRL 6.5 (GLOVE) IMPLANT
GLOVE BIOGEL PI IND STRL 7.0 (GLOVE) IMPLANT
GLOVE BIOGEL PI INDICATOR 6.5 (GLOVE) ×1
GLOVE BIOGEL PI INDICATOR 7.0 (GLOVE) ×1
GLOVE SURG SS PI 6.0 STRL IVOR (GLOVE) ×1 IMPLANT
GOWN STRL REUS W/ TWL LRG LVL3 (GOWN DISPOSABLE) IMPLANT
GOWN STRL REUS W/ TWL XL LVL3 (GOWN DISPOSABLE) ×1 IMPLANT
GOWN STRL REUS W/TWL 2XL LVL3 (GOWN DISPOSABLE) ×1 IMPLANT
GOWN STRL REUS W/TWL LRG LVL3 (GOWN DISPOSABLE) ×2
GOWN STRL REUS W/TWL XL LVL3 (GOWN DISPOSABLE) ×4
HEMOSTAT POWDER KIT SURGIFOAM (HEMOSTASIS) IMPLANT
IMPL QUARTEX 3.5X12MM (Neuro Prosthesis/Implant) IMPLANT
IMPLANT QUARTEX 3.5X12MM (Neuro Prosthesis/Implant) ×8 IMPLANT
KIT BASIN OR (CUSTOM PROCEDURE TRAY) ×2 IMPLANT
KIT ROOM TURNOVER OR (KITS) ×2 IMPLANT
LOCKING CAP (Cap) ×8 IMPLANT
MARKER SKIN DUAL TIP RULER LAB (MISCELLANEOUS) ×2 IMPLANT
NDL HYPO 25X1 1.5 SAFETY (NEEDLE) ×1 IMPLANT
NDL SPNL 20GX3.5 QUINCKE YW (NEEDLE) IMPLANT
NEEDLE HYPO 25X1 1.5 SAFETY (NEEDLE) ×2 IMPLANT
NEEDLE SPNL 20GX3.5 QUINCKE YW (NEEDLE) ×2 IMPLANT
NS IRRIG 1000ML POUR BTL (IV SOLUTION) ×2 IMPLANT
OIL CARTRIDGE MAESTRO DRILL (MISCELLANEOUS) ×2
PACK LAMINECTOMY NEURO (CUSTOM PROCEDURE TRAY) ×2 IMPLANT
PAD ARMBOARD 7.5X6 YLW CONV (MISCELLANEOUS) ×2 IMPLANT
PIN MAYFIELD SKULL DISP (PIN) ×2 IMPLANT
PUTTY KINEX BIOACTIVE 2CC (Bone Implant) ×1 IMPLANT
ROD ELLIPSE 40MMX3.5 (Rod) ×2 IMPLANT
SPONGE LAP 4X18 X RAY DECT (DISPOSABLE) IMPLANT
SPONGE SURGIFOAM ABS GEL SZ50 (HEMOSTASIS) ×2 IMPLANT
STRIP CLOSURE SKIN 1/2X4 (GAUZE/BANDAGES/DRESSINGS) ×2 IMPLANT
SUT VIC AB 0 CT1 18XCR BRD8 (SUTURE) ×1 IMPLANT
SUT VIC AB 0 CT1 8-18 (SUTURE) ×2
SUT VIC AB 2-0 CP2 18 (SUTURE) ×2 IMPLANT
SUT VIC AB 3-0 SH 8-18 (SUTURE) ×3 IMPLANT
TOWEL GREEN STERILE (TOWEL DISPOSABLE) ×2 IMPLANT
TOWEL GREEN STERILE FF (TOWEL DISPOSABLE) ×2 IMPLANT
WATER STERILE IRR 1000ML POUR (IV SOLUTION) ×2 IMPLANT

## 2017-07-01 NOTE — Anesthesia Procedure Notes (Signed)
Procedure Name: Intubation Date/Time: 07/01/2017 11:54 AM Performed by: Lowella Dell, CRNA Pre-anesthesia Checklist: Patient identified, Emergency Drugs available, Suction available, Patient being monitored and Timeout performed Patient Re-evaluated:Patient Re-evaluated prior to induction Oxygen Delivery Method: Circle system utilized Preoxygenation: Pre-oxygenation with 100% oxygen Induction Type: IV induction Ventilation: Mask ventilation without difficulty, Oral airway inserted - appropriate to patient size and Two handed mask ventilation required Laryngoscope Size: Glidescope and 3 Grade View: Grade I Tube type: Subglottic suction tube Tube size: 7.0 mm Number of attempts: 1 Airway Equipment and Method: Stylet and Video-laryngoscopy Placement Confirmation: ETT inserted through vocal cords under direct vision,  breath sounds checked- equal and bilateral and CO2 detector Secured at: 22 cm Tube secured with: Tape Dental Injury: Teeth and Oropharynx as per pre-operative assessment

## 2017-07-01 NOTE — Anesthesia Preprocedure Evaluation (Addendum)
Anesthesia Evaluation  Patient identified by MRN, date of birth, ID band Patient awake    Reviewed: Allergy & Precautions, H&P , NPO status , Patient's Chart, lab work & pertinent test results, reviewed documented beta blocker date and time   Airway Mallampati: II  TM Distance: >3 FB Neck ROM: Full    Dental no notable dental hx. (+) Dental Advisory Given   Pulmonary neg pulmonary ROS, sleep apnea , Current Smoker, former smoker,    Pulmonary exam normal breath sounds clear to auscultation       Cardiovascular negative cardio ROS Normal cardiovascular exam Rhythm:Regular Rate:Normal     Neuro/Psych PSYCHIATRIC DISORDERS Anxiety Depression negative neurological ROS  negative psych ROS   GI/Hepatic negative GI ROS, Neg liver ROS, GERD  ,  Endo/Other  negative endocrine ROSHypothyroidism   Renal/GU negative Renal ROS  negative genitourinary   Musculoskeletal negative musculoskeletal ROS (+) Arthritis , Osteoarthritis,    Abdominal   Peds negative pediatric ROS (+)  Hematology negative hematology ROS (+)   Anesthesia Other Findings   Reproductive/Obstetrics negative OB ROS                            Anesthesia Physical  Anesthesia Plan  ASA: II  Anesthesia Plan: General   Post-op Pain Management:    Induction: Intravenous  PONV Risk Score and Plan: 2 and Dexamethasone, Ondansetron and Treatment may vary due to age or medical condition  Airway Management Planned: Oral ETT  Additional Equipment:   Intra-op Plan:   Post-operative Plan: Extubation in OR  Informed Consent: I have reviewed the patients History and Physical, chart, labs and discussed the procedure including the risks, benefits and alternatives for the proposed anesthesia with the patient or authorized representative who has indicated his/her understanding and acceptance.   Dental advisory given  Plan Discussed with:  CRNA and Surgeon  Anesthesia Plan Comments:         Anesthesia Quick Evaluation

## 2017-07-01 NOTE — Op Note (Signed)
07/01/2017  1:20 PM  PATIENT:  [name]  47 y.o. 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 Imaging showed pseudoarthrosis of C6-7 ACDF done elsewhere. The patient tried conservative measures without relief. Pain was debilitating. Recommended posterior cervical fusion. Patient understood the risks, benefits, and alternatives and potential outcomes and wished to proceed.  PROCEDURE DETAILS: The patient was brought to the operating room. Generalized endotracheal anesthesia was induced. The patient was affixed a 3 point [place] headrest and rolled into the prone position on chest rolls. All pressure points were padded. The posterior cervical region was cleaned and prepped with DuraPrep and then draped in the usual sterile fashion. 7 cc of local anesthesia was injected and a dorsal midline incision made in the posterior cervical region and carried down to the cervical fascia. The fascia was opened and the paraspinous musculature was taken down to expose C6-7. Intraoperative fluoroscopy confirmed my level and then the dissection was carried out over the lateral facets. I localized the midpoint of each lateral mass and marked a region 1 mm medial to the midpoint of the lateral mass, and then drilled in an upward and outward direction into the safe zone of each lateral mass. I drilled to a depth of 12 mm and then checked my drill hole with a ball probe. I then placed a 12 mm lateral mass  screws into the safe zone of each lateral mass until they were 2 fingers tight.  I then decorticated the lateral masses and the facet joints and packed them with morcellized allograft soaked with local blood and saline to perform arthrodesis from C6-7. I then placed rods into the multiaxial screw heads of the screws and locked these into position with the locking caps and anti-torque device. I then checked the final construct with AP/Lat fluoroscopy. I irrigated with saline solution containing bacitracin.  After hemostasis was achieved I placed powdered vancomycin into the wound and closed the muscle and the fascia with 0 Vicryl, subcutaneous tissue with 2-0 Vicryl, and the subcuticular tissue with 3-0 Vicryl. The skin was closed with benzoin and Steri-Strips. A sterile dressing was applied, the patient was turned to the supine position and taken out of the headrest, awakened from general anesthesia and transferred to the recovery room in stable condition. At the end of the procedure all sponge, needle and instrument counts were correct.    PLAN OF CARE: Admit to inpatient   PATIENT DISPOSITION:  PACU - hemodynamically stable.   Delay start of Pharmacological VTE agent (>24hrs) due to surgical blood loss or risk of bleeding:  yes

## 2017-07-01 NOTE — H&P (Signed)
Subjective:   Patient is a 47 y.o. 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 The patient first presented to me with complaints of neck pain, shooting pains in the arm(s) and numbness of the arm(s). Onset of symptoms was several months ago. The pain is described as aching and sharp and occurs all day. The pain is rated severe, unremitting, and is located in the neck and radiates to the arms. The symptoms have been progressive. Symptoms are exacerbated by extending head backwards, and are relieved by none.  Previous work up includes CT of cervical spine, results: pseudoarthrosis C6-7.  Past Medical History:  Diagnosis Date  . Anxiety   . Breast mass 02/2014  . 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 Chronic back pain   . Chronic constipation   . Depression   . Fibromyalgia   . GERD (gastroesophageal reflux disease)   . H/O cardiac radiofrequency ablation 1996  . Headache   . History of kidney stones    one lodged - 2 mm.  . Hyperthyroidism   . Hypothyroidism   . Neuromuscular disorder ([facility])    sciatica   . Rheumatoid arthritis ([place])    pt. denies, reassessed & told that wasnot accurate, OA- lumbar & cervical    . Sleep apnea    CPAP, lasted used 2 months ago  . Wolff-Parkinson-White (WPW) syndrome     Past Surgical History:  Procedure Laterality Date  . BACK SURGERY     x4  . CARDIAC ELECTROPHYSIOLOGY MAPPING AND ABLATION     WPW  . CERVICAL SPINE SURGERY     fusions x2  . COLONOSCOPY  04/06/2013  . ESOPHAGOGASTRODUODENOSCOPY  04/06/2013  . LUMBAR LAMINECTOMY/DECOMPRESSION MICRODISCECTOMY Right 09/02/2016   Procedure: RIGHT LUMBAR FIVE-SACRAL ONE REDO MICRODISCECTOMY;  Surgeon: Eustace Moore, MD;  Location: [facility];  Service: Neurosurgery;  Laterality: Right;  . LUMBAR SPINE SURGERY     x 3  . SIGMOIDOSCOPY    . THYROIDECTOMY    . TONSILLECTOMY      Allergies  Allergen Reactions  . Dilaudid [Hydromorphone Hcl] Palpitations    Heart palpitations   . Phentermine Palpitations   TACHYCARDIA  . Shellfish Allergy Hives    SHRIMP   . Darvocet [Propoxyphene N-Acetaminophen] Other (See Comments)    Patient states makes her feel "loopy"  . Lactose Intolerance (Gi) Diarrhea    GI Distress    Social History   Tobacco Use  . Smoking status: Former Smoker    Packs/day: 0.25    Types: Cigarettes    Last attempt to quit: 09/01/2016    Years since quitting: 0.8  . Smokeless tobacco: Former Systems developer  . Tobacco comment: vapor ecig  Substance Use Topics  . Alcohol use: No    Family History  Problem Relation Age of Onset  . Colon polyps Mother   . Mitral valve prolapse Mother   . High Cholesterol Mother   . Cardiomyopathy Brother    Prior to Admission medications   Medication Sig Start Date End Date Taking? Authorizing Provider  amphetamine-dextroamphetamine (ADDERALL) 20 MG tablet Take 20 mg by mouth 3 (three) times daily.  07/26/16  Yes [provider]  b complex vitamins tablet Take 1 tablet by mouth daily.   Yes [provider]  Brexpiprazole (REXULTI) 2 MG TABS Take 2 mg by mouth daily.   Yes [provider]  cetirizine (ZYRTEC) 10 MG tablet Take 10 mg by mouth daily as needed for allergies.    Yes [provider]  Cholecalciferol (  VITAMIN D3) 5000 units CAPS Take 5,000 Units by mouth daily.   Yes [provider]  clonazePAM (KLONOPIN) 2 MG tablet Take 2 mg by mouth 3 (three) times daily as needed for anxiety (1 at bedtime every night).  03/03/13  Yes [provider]  doxepin (SINEQUAN) 50 MG capsule Take 100 mg by mouth at bedtime.   Yes [provider]  DULoxetine (CYMBALTA) 60 MG capsule Take 60 mg by mouth daily.   Yes [provider]  esomeprazole (NEXIUM) 40 MG capsule Take 40 mg by mouth daily. 04/09/17  Yes [provider]  fluticasone (FLONASE) 50 MCG/ACT nasal spray Place 2 sprays into both nostrils at bedtime.    Yes [provider]  gabapentin (NEURONTIN) 600 MG tablet  Take 3,000 mg by mouth at bedtime. Takes all 5 tablets at bedtime 04/28/17  Yes [provider]  JUNEL 1.5/30 1.5-30 MG-MCG tablet Take 1 tablet by mouth daily. 06/05/17  Yes [provider]  levothyroxine (SYNTHROID, LEVOTHROID) 175 MCG tablet Take 175 mcg by mouth daily.   Yes [provider]  lidocaine (LIDODERM) 5 % Place 3 patches onto the skin at bedtime. 3 patches at bedtime 08/19/16  Yes [provider]  MAGNESIUM PO Take 1 tablet by mouth daily.   Yes [provider]  meloxicam (MOBIC) 15 MG tablet Take 15 mg by mouth daily.  03/07/13  Yes [provider]  ondansetron (ZOFRAN-ODT) 4 MG disintegrating tablet Take 4 mg by mouth every 8 (eight) hours as needed for nausea or vomiting.   Yes [provider]  oxyCODONE (ROXICODONE) 15 MG immediate release tablet Take 15 mg by mouth every 4 (four) hours.   Yes [provider]  Potassium (POTASSIMIN PO) Take 1 tablet by mouth daily.   Yes [provider]  Suvorexant (BELSOMRA) 20 MG TABS Take 20 mg by mouth at bedtime.   Yes [provider]  tizanidine (ZANAFLEX) 6 MG capsule Take 6 mg by mouth daily as needed for muscle spasms. 05/25/16  Yes [provider]  topiramate (TOPAMAX) 50 MG tablet Take 50 mg by mouth daily.   Yes [provider]  valACYclovir (VALTREX) 1000 MG tablet Take 2,000 mg by mouth 2 (two) times daily as needed (outbreaks).  06/13/17  Yes [provider]  VIIBRYD 40 MG TABS Take 40 mg by mouth daily.  01/11/13  Yes [provider]  lubiprostone (AMITIZA) 8 MCG capsule Take 1 capsule (8 mcg total) by mouth 2 (two) times daily with a meal. Patient not taking: Reported on 06/15/2017 12/13/14   [name], PA-C     Review of Systems  Positive ROS: neg  All other systems have been reviewed and were otherwise negative with the exception of those mentioned in the HPI and as above.  Objective: Vital signs  in last 24 hours: Temp:  [97.8 F (36.6 C)] 97.8 F (36.6 C) (01/03 1107) Pulse Rate:  [102] 102 (01/03 1107) Resp:  [18] 18 (01/03 1107) BP: (129)/(72) 129/72 (01/03 1107) SpO2:  [97 %] 97 % (01/03 1107) Weight:  [107.5 kg (237 lb 1.6 oz)] 107.5 kg (237 lb 1.6 oz) (01/03 1107)  General Appearance: Alert, cooperative, no distress, appears stated age Head: Normocephalic, without obvious abnormality, atraumatic Eyes: PERRL, conjunctiva/corneas clear, EOM's intact      Neck: Supple, symmetrical, trachea midline, Back: Symmetric, no curvature, ROM normal, no CVA tenderness Lungs:  respirations unlabored Heart: Regular rate and rhythm Abdomen: Soft, non-tender Extremities:  Extremities normal, atraumatic, no cyanosis or edema Pulses: 2+ and symmetric all extremities Skin: Skin color, texture, turgor normal, no rashes or lesions  NEUROLOGIC:  Mental status: Alert and oriented x4, no aphasia, good attention span, fund of knowledge and memory  Motor Exam - grossly normal Sensory Exam - grossly normal Reflexes: 1+ Coordination - grossly normal Gait - grossly normal Balance - grossly normal Cranial Nerves: I: smell Not tested  II: visual acuity  OS: nl    OD: nl  II: visual fields Full to confrontation  II: pupils Equal, round, reactive to light  III,VII: ptosis None  III,IV,VI: extraocular muscles  Full ROM  V: mastication Normal  V: facial light touch sensation  Normal  V,VII: corneal reflex  Present  VII: facial muscle function - upper  Normal  VII: facial muscle function - lower Normal  VIII: hearing Not tested  IX: soft palate elevation  Normal  IX,X: gag reflex Present  XI: trapezius strength  5/5  XI: sternocleidomastoid strength 5/5  XI: neck flexion strength  5/5  XII: tongue strength  Normal    Data Review Lab Results  Component Value Date   WBC 10.0 06/28/2017   HGB 13.1 06/28/2017   HCT 41.2 06/28/2017   MCV 85.1 06/28/2017   PLT 284 06/28/2017   Lab  Results  Component Value Date   NA 136 06/28/2017   K 4.1 06/28/2017   CL 106 06/28/2017   CO2 23 06/28/2017   BUN 5 (L) 06/28/2017   CREATININE 0.97 06/28/2017   GLUCOSE 94 06/28/2017   Lab Results  Component Value Date   INR 0.97 06/28/2017    Assessment:   Cervical neck pain with herniated nucleus pulposus/ 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 Patient has failed conservative therapy. Planned surgery : PCF C6-7  Plan:   I explained the condition and procedure to the patient and answered any questions.  Patient wishes to proceed with procedure as planned. Understands risks/ benefits/ and expected or typical outcomes.  Kyrie Bun S 07/01/2017 11:23 AM

## 2017-07-01 NOTE — Transfer of Care (Signed)
Immediate Anesthesia Transfer of Care Note  Patient: [name]  Procedure(s) Performed: Posterior Cervical Fusion with lateral mass fixation Cervical Six-Seven (N/A )  Patient Location: PACU  Anesthesia Type:General  Level of Consciousness: awake, oriented and patient cooperative  Airway & Oxygen Therapy: Patient Spontanous Breathing and Patient connected to nasal cannula oxygen  Post-op Assessment: Report given to RN and Post -op Vital signs reviewed and stable  Post vital signs: Reviewed  Last Vitals:  Vitals:   07/01/17 1107  BP: 129/72  Pulse: (!) 102  Resp: 18  Temp: 36.6 C  SpO2: 97%    Last Pain:  Vitals:   07/01/17 1124  TempSrc:   PainSc: 5       Patients Stated Pain Goal: 3 (10/10/62 3837)  Complications: No apparent anesthesia complications

## 2017-07-01 NOTE — Anesthesia Postprocedure Evaluation (Signed)
Anesthesia Post Note  Patient: [name]  Procedure(s) Performed: Posterior Cervical Fusion with lateral mass fixation Cervical Six-Seven (N/A )     Patient location during evaluation: PACU Anesthesia Type: General Level of consciousness: awake and alert Pain management: pain level controlled Vital Signs Assessment: post-procedure vital signs reviewed and stable Respiratory status: spontaneous breathing, nonlabored ventilation, respiratory function stable and patient connected to nasal cannula oxygen Cardiovascular status: blood pressure returned to baseline and stable Postop Assessment: no apparent nausea or vomiting Anesthetic complications: no    Last Vitals:  Vitals:   07/01/17 1425 07/01/17 1432  BP: (!) 146/93 (!) 145/93  Pulse: 88 89  Resp: 14 14  Temp:  36.4 C  SpO2: 95% 94%    Last Pain:  Vitals:   07/01/17 1419  TempSrc:   PainSc: 6                  Karly Pitter EDWARD

## 2017-07-01 NOTE — Plan of Care (Signed)
Progressing Education: Knowledge of General Education information will improve 07/01/2017 2231 - Progressing by Margot Chimes D, RN Health Behavior/Discharge Planning: Ability to manage health-related needs will improve 07/01/2017 2231 - Progressing by Pricilla Holm, Snigdha Howser D, RN Clinical Measurements: Ability to maintain clinical measurements within normal limits will improve 07/01/2017 2231 - Progressing by Pricilla Holm, Jauna Raczynski D, RN Will remain free from infection 07/01/2017 2231 - Progressing by Margot Chimes D, RN Diagnostic test results will improve 07/01/2017 2231 - Progressing by Margot Chimes D, RN Respiratory complications will improve 07/01/2017 2231 - Progressing by Margot Chimes D, RN Cardiovascular complication will be avoided 07/01/2017 2231 - Progressing by Margot Chimes D, RN Activity: Risk for activity intolerance will decrease 07/01/2017 2231 - Progressing by Margot Chimes D, RN Nutrition: Adequate nutrition will be maintained 07/01/2017 2231 - Progressing by Margot Chimes D, RN Coping: Level of anxiety will decrease 07/01/2017 2231 - Progressing by Margot Chimes D, RN Elimination: Will not experience complications related to bowel motility 07/01/2017 2231 - Progressing by Charlena Cross, RN Will not experience complications related to urinary retention 07/01/2017 2231 - Progressing by Margot Chimes D, RN Pain Managment: General experience of comfort will improve 07/01/2017 2231 - Progressing by Margot Chimes D, RN Safety: Ability to remain free from injury will improve 07/01/2017 2231 - Progressing by Pricilla Holm, Monifah Freehling D, RN Skin Integrity: Risk for impaired skin integrity will decrease 07/01/2017 2231 - Progressing by Charlena Cross, RN Activity: Ability to avoid complications of mobility impairment will improve 07/01/2017 2231 - Progressing by Pricilla Holm, Mellany Dinsmore D, RN Ability to tolerate increased activity will improve 07/01/2017 2231 - Progressing by Charlena Cross, RN Will remain free from  falls 07/01/2017 2231 - Progressing by Margot Chimes D, RN Bowel/Gastric: Gastrointestinal status for postoperative course will improve 07/01/2017 2231 - Progressing by Margot Chimes D, RN Education: Ability to verbalize activity precautions or restrictions will improve 07/01/2017 2231 - Progressing by Margot Chimes D, RN Knowledge of the prescribed therapeutic regimen will improve 07/01/2017 2231 - Progressing by Margot Chimes D, RN Understanding of discharge needs will improve 07/01/2017 2231 - Progressing by Pricilla Holm, Cozy Veale D, RN Physical Regulation: Ability to maintain clinical measurements within normal limits will improve 07/01/2017 2231 - Progressing by Pricilla Holm, Malayshia All D, RN Postoperative complications will be avoided or minimized 07/01/2017 2231 - Progressing by Margot Chimes D, RN Diagnostic test results will improve 07/01/2017 2231 - Progressing by Margot Chimes D, RN Pain Management: Pain level will decrease 07/01/2017 2231 - Progressing by Charlena Cross, RN Skin Integrity: Signs of wound healing will improve 07/01/2017 2231 - Progressing by Margot Chimes D, RN Health Behavior/Discharge Planning: Identification of resources available to assist in meeting health care needs will improve 07/01/2017 2231 - Progressing by Margot Chimes D, RN Bladder/Genitourinary: Urinary functional status for postoperative course will improve 07/01/2017 2231 - Progressing by Charlena Cross, RN   Progressing Education: Knowledge of General Education information will improve 07/01/2017 2232 - Progressing by Charlena Cross, RN 07/01/2017 2231 - Progressing by Charlena Cross, RN Health Behavior/Discharge Planning: Ability to manage health-related needs will improve 07/01/2017 2232 - Progressing by Charlena Cross, RN 07/01/2017 2231 - Progressing by Charlena Cross, RN Clinical Measurements: Ability to maintain clinical measurements within normal limits will improve 07/01/2017 2232 - Progressing by Charlena Cross,  RN 07/01/2017 2231 - Progressing by Charlena Cross, RN Will remain free from infection 07/01/2017 2232 - Progressing by Charlena Cross, RN 07/01/2017 2231 - Progressing by Margot Chimes  D, RN Diagnostic test results will improve 07/01/2017 2232 - Progressing by Charlena Cross, RN 07/01/2017 2231 - Progressing by Margot Chimes D, RN Respiratory complications will improve 07/01/2017 2232 - Progressing by Charlena Cross, RN 07/01/2017 2231 - Progressing by Margot Chimes D, RN Cardiovascular complication will be avoided 07/01/2017 2232 - Progressing by Charlena Cross, RN 07/01/2017 2231 - Progressing by Margot Chimes D, RN Activity: Risk for activity intolerance will decrease 07/01/2017 2232 - Progressing by Charlena Cross, RN 07/01/2017 2231 - Progressing by Margot Chimes D, RN Nutrition: Adequate nutrition will be maintained 07/01/2017 2232 - Progressing by Charlena Cross, RN 07/01/2017 2231 - Progressing by Margot Chimes D, RN Coping: Level of anxiety will decrease 07/01/2017 2232 - Progressing by Charlena Cross, RN 07/01/2017 2231 - Progressing by Margot Chimes D, RN Elimination: Will not experience complications related to bowel motility 07/01/2017 2232 - Progressing by Charlena Cross, RN 07/01/2017 2231 - Progressing by Charlena Cross, RN Will not experience complications related to urinary retention 07/01/2017 2232 - Progressing by Charlena Cross, RN 07/01/2017 2231 - Progressing by Margot Chimes D, RN Pain Managment: General experience of comfort will improve 07/01/2017 2232 - Progressing by Charlena Cross, RN 07/01/2017 2231 - Progressing by Margot Chimes D, RN Safety: Ability to remain free from injury will improve 07/01/2017 2232 - Progressing by Charlena Cross, RN 07/01/2017 2231 - Progressing by Margot Chimes D, RN Skin Integrity: Risk for impaired skin integrity will decrease 07/01/2017 2232 - Progressing by Charlena Cross, RN 07/01/2017 2231 - Progressing by Charlena Cross, RN Activity: Ability  to avoid complications of mobility impairment will improve 07/01/2017 2232 - Progressing by Margot Chimes D, RN 07/01/2017 2231 - Progressing by Charlena Cross, RN Ability to tolerate increased activity will improve 07/01/2017 2232 - Progressing by Charlena Cross, RN 07/01/2017 2231 - Progressing by Charlena Cross, RN Will remain free from falls 07/01/2017 2232 - Progressing by Charlena Cross, RN 07/01/2017 2231 - Progressing by Margot Chimes D, RN Bowel/Gastric: Gastrointestinal status for postoperative course will improve 07/01/2017 2232 - Progressing by Charlena Cross, RN 07/01/2017 2231 - Progressing by Margot Chimes D, RN Education: Ability to verbalize activity precautions or restrictions will improve 07/01/2017 2232 - Progressing by Margot Chimes D, RN 07/01/2017 2231 - Progressing by Charlena Cross, RN Knowledge of the prescribed therapeutic regimen will improve 07/01/2017 2232 - Progressing by Charlena Cross, RN 07/01/2017 2231 - Progressing by Margot Chimes D, RN Understanding of discharge needs will improve 07/01/2017 2232 - Progressing by Charlena Cross, RN 07/01/2017 2231 - Progressing by Charlena Cross, RN Physical Regulation: Ability to maintain clinical measurements within normal limits will improve 07/01/2017 2232 - Progressing by Margot Chimes D, RN 07/01/2017 2231 - Progressing by Charlena Cross, RN Postoperative complications will be avoided or minimized 07/01/2017 2232 - Progressing by Charlena Cross, RN 07/01/2017 2231 - Progressing by Charlena Cross, RN Diagnostic test results will improve 07/01/2017 2232 - Progressing by Charlena Cross, RN 07/01/2017 2231 - Progressing by Margot Chimes D, RN Pain Management: Pain level will decrease 07/01/2017 2232 - Progressing by Charlena Cross, RN 07/01/2017 2231 - Progressing by Charlena Cross, RN Skin Integrity: Signs of wound healing will improve 07/01/2017 2232 - Progressing by Charlena Cross, RN 07/01/2017 2231 - Progressing by Margot Chimes  D, RN Health Behavior/Discharge Planning: Identification of resources available to assist in meeting health care needs  will improve 07/01/2017 2232 - Progressing by Charlena Cross, RN 07/01/2017 2231 - Progressing by Margot Chimes D, RN Bladder/Genitourinary: Urinary functional status for postoperative course will improve 07/01/2017 2232 - Progressing by Charlena Cross, RN 07/01/2017 2231 - Progressing by Charlena Cross, RN

## 2017-07-02 ENCOUNTER — Encounter (HOSPITAL_COMMUNITY): Payer: Self-pay | Admitting: Neurological Surgery

## 2017-07-02 MED ORDER — OXYCODONE HCL 15 MG PO TABS: 15.0000 mg | ORAL_TABLET | ORAL | 0 refills | Status: DC | PRN

## 2017-07-02 MED ORDER — TIZANIDINE HCL 6 MG PO CAPS: 6.0000 mg | ORAL_CAPSULE | Freq: Three times a day (TID) | ORAL | 1 refills | Status: DC | PRN

## 2017-07-02 NOTE — Progress Notes (Signed)
Pt placed on CPAP machine at 11 pm- Pt using her personal mask and tubing.  Tolerating well.

## 2017-07-02 NOTE — Progress Notes (Signed)
Patient is discharged from room 3C08 at this time. Alert and in stable condition. IV site d/c'[d and instructions read to patient and spouse with understanding verbalized. Left unit via wheelchair with all belongings at side. 

## 2017-07-02 NOTE — Discharge Summary (Signed)
Physician Discharge Summary  Patient ID: [name] MRN: [id number] DOB/AGE: 49-19-1972 49 y.o.  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 The patient tolerated the procedure well and was taken to the recovery room and then to the floor in stable condition. The hospital course was routine. There were no complications. The wound remained clean dry and intact. Pt had appropriate neck soreness. No complaints of arm pain or new N/T/W. The patient remained afebrile with stable vital signs, and tolerated a regular diet. The patient continued to increase activities, and pain was well controlled with oral pain medications.   Consults: None  Significant Diagnostic Studies:  Results for orders placed or performed during the hospital encounter of 06/28/17  Surgical pcr screen  Result Value Ref Range   MRSA, PCR NEGATIVE NEGATIVE   Staphylococcus aureus POSITIVE (A) NEGATIVE  Basic metabolic panel  Result Value Ref Range   Sodium 136 135 - 145 mmol/L   Potassium 4.1 3.5 - 5.1 mmol/L   Chloride 106 101 - 111 mmol/L   CO2 23 22 - 32 mmol/L   Glucose, Bld 94 65 - 99 mg/dL   BUN 5 (L) 6 - 20 mg/dL   Creatinine, Ser 0.97 0.44 - 1.00 mg/dL   Calcium 9.1 8.9 - 10.3 mg/dL   GFR calc non Af Amer >60 >60 mL/min   GFR calc Af Amer >60 >60 mL/min   Anion gap 7 5 - 15  CBC WITH DIFFERENTIAL  Result Value Ref Range   WBC 10.0 4.0 - 10.5 K/uL   RBC 4.84 3.87 - 5.11 MIL/uL   Hemoglobin 13.1 12.0 - 15.0 g/dL   HCT 41.2 36.0 - 46.0 %   MCV 85.1 78.0 - 100.0 fL   MCH 27.1 26.0 - 34.0 pg   MCHC 31.8 30.0 - 36.0 g/dL   RDW 14.7 11.5 - 15.5 %   Platelets 284 150 - 400 K/uL   Neutrophils Relative % 71 %   Neutro Abs 7.1 1.7 - 7.7 K/uL   Lymphocytes Relative 22 %    Lymphs Abs 2.2 0.7 - 4.0 K/uL   Monocytes Relative 5 %   Monocytes Absolute 0.5 0.1 - 1.0 K/uL   Eosinophils Relative 2 %   Eosinophils Absolute 0.2 0.0 - 0.7 K/uL   Basophils Relative 0 %   Basophils Absolute 0.0 0.0 - 0.1 K/uL  Protime-INR  Result Value Ref Range   Prothrombin Time 12.8 11.4 - 15.2 seconds   INR 0.97   hCG, serum, qualitative  Result Value Ref Range   Preg, Serum NEGATIVE NEGATIVE  Type and screen [place]  Result Value Ref Range   ABO/RH(D) O NEG    Antibody Screen NEG    Sample Expiration 07/12/2017    Extend sample reason NO TRANSFUSIONS OR PREGNANCY IN THE PAST 3 MONTHS   ABO/Rh  Result Value Ref Range   ABO/RH(D) O NEG     Chest 2 View  Result Date: 06/28/2017 CLINICAL DATA:  Preoperative examination prior cervical spine surgery. Discontinued smoking 1 week ago. History of gastroesophageal reflux and sleep apnea. Previous thyroidectomy. EXAM: CHEST  2 VIEW COMPARISON:  Chest x-ray of Nov 11, 2010 FINDINGS: The lungs are adequately inflated and clear. The heart and mediastinal structures are normal. There is no  pleural effusion. The bony thorax exhibits no acute abnormality. The patient has undergone previous lower anterior cervical fusion. IMPRESSION: There is no active cardiopulmonary disease. Electronically Signed   By: David  Martinique M.D.   On: 06/28/2017 10:25   Dg Cervical Spine 2-3 Views  Result Date: 07/01/2017 CLINICAL DATA:  C6-7 fusion. EXAM: DG C-ARM 61-120 MIN; CERVICAL SPINE - 2-3 VIEW COMPARISON:  Prior studies FINDINGS: 4 lateral intraoperative views of the cervical spine are submitted postoperatively for interpretation. Films demonstrate anterior fusion hardware from C5-C7 again noted. Posterior rod and screw fixation at C6-7 noted. IMPRESSION: Posterior rod and screw fixation at C6-7. Electronically Signed   By: Margarette Canada M.D.   On: 07/01/2017 13:24   Dg C-arm 1-60 Min  Result Date: 07/01/2017 CLINICAL DATA:  C6-7  fusion. EXAM: DG C-ARM 61-120 MIN; CERVICAL SPINE - 2-3 VIEW COMPARISON:  Prior studies FINDINGS: 4 lateral intraoperative views of the cervical spine are submitted postoperatively for interpretation. Films demonstrate anterior fusion hardware from C5-C7 again noted. Posterior rod and screw fixation at C6-7 noted. IMPRESSION: Posterior rod and screw fixation at C6-7. Electronically Signed   By: Margarette Canada M.D.   On: 07/01/2017 13:24    Antibiotics:  Anti-infectives (From admission, onward)   Start     Dose/Rate Route Frequency Ordered Stop   07/01/17 2000  ceFAZolin (ANCEF) IVPB 1 g/50 mL premix     1 g 100 mL/hr over 30 Minutes Intravenous Every 8 hours 07/01/17 1445 07/02/17 0335   07/01/17 1445  valACYclovir (VALTREX) tablet 2,000 mg     2,000 mg Oral 2 times daily PRN 07/01/17 1446     07/01/17 1259  vancomycin (VANCOCIN) powder  Status:  Discontinued       As needed 07/01/17 1259 07/01/17 1323   07/01/17 1236  bacitracin 50,000 Units in sodium chloride irrigation 0.9 % 500 mL irrigation  Status:  Discontinued       As needed 07/01/17 1236 07/01/17 1323   07/01/17 1108  ceFAZolin (ANCEF) 2-4 GM/100ML-% IVPB    Comments:  Hogue, Samantha   : cabinet override      07/01/17 1108 07/01/17 1150   07/01/17 1103  ceFAZolin (ANCEF) IVPB 2g/100 mL premix     2 g 200 mL/hr over 30 Minutes Intravenous On call to O.R. 07/01/17 1103 07/01/17 1150      Discharge Exam: Blood pressure 103/62, pulse 97, temperature 98.7 F (37.1 C), temperature source Oral, resp. rate 18, height 5' 7.5" (1.715 m), weight 107.5 kg (237 lb 1.6 oz), last menstrual period 01/29/2017, SpO2 95 %. Neurologic: Grossly normal Dressing dry  Discharge Medications:   Allergies as of 07/02/2017      Reactions   Dilaudid [hydromorphone Hcl] Palpitations   Heart palpitations    Phentermine Palpitations   TACHYCARDIA   Shellfish Allergy Hives   SHRIMP    Darvocet [propoxyphene N-acetaminophen] Other (See Comments)    Patient states makes her feel "loopy"   Lactose Intolerance (gi) Diarrhea   GI Distress      Medication List    TAKE these medications   amphetamine-dextroamphetamine 20 MG tablet Commonly known as:  ADDERALL Take 20 mg by mouth 3 (three) times daily.   b complex vitamins tablet Take 1 tablet by mouth daily.   BELSOMRA 20 MG Tabs Generic drug:  Suvorexant Take 20 mg by mouth at bedtime.   cetirizine 10 MG tablet Commonly known as:  ZYRTEC Take 10 mg by mouth daily as needed for allergies.  clonazePAM 2 MG tablet Commonly known as:  KLONOPIN Take 2 mg by mouth 3 (three) times daily as needed for anxiety (1 at bedtime every night).   doxepin 50 MG capsule Commonly known as:  SINEQUAN Take 100 mg by mouth at bedtime.   DULoxetine 60 MG capsule Commonly known as:  CYMBALTA Take 60 mg by mouth daily.   esomeprazole 40 MG capsule Commonly known as:  NEXIUM Take 40 mg by mouth daily.   fluticasone 50 MCG/ACT nasal spray Commonly known as:  FLONASE Place 2 sprays into both nostrils at bedtime.   gabapentin 600 MG tablet Commonly known as:  NEURONTIN Take 3,000 mg by mouth at bedtime. Takes all 5 tablets at bedtime   JUNEL 1.5/30 1.5-30 MG-MCG tablet Generic drug:  Norethindrone Acetate-Ethinyl Estradiol Take 1 tablet by mouth daily.   levothyroxine 175 MCG tablet Commonly known as:  SYNTHROID, LEVOTHROID Take 175 mcg by mouth daily.   lidocaine 5 % Commonly known as:  LIDODERM Place 3 patches onto the skin at bedtime. 3 patches at bedtime   lubiprostone 8 MCG capsule Commonly known as:  AMITIZA Take 1 capsule (8 mcg total) by mouth 2 (two) times daily with a meal.   MAGNESIUM PO Take 1 tablet by mouth daily.   meloxicam 15 MG tablet Commonly known as:  MOBIC Take 15 mg by mouth daily.   ondansetron 4 MG disintegrating tablet Commonly known as:  ZOFRAN-ODT Take 4 mg by mouth every 8 (eight) hours as needed for nausea or vomiting.   oxyCODONE 15 MG  immediate release tablet Commonly known as:  ROXICODONE Take 1 tablet (15 mg total) by mouth every 4 (four) hours as needed for pain. What changed:    when to take this  reasons to take this   POTASSIMIN PO Take 1 tablet by mouth daily.   REXULTI 2 MG Tabs Generic drug:  Brexpiprazole Take 2 mg by mouth daily.   tizanidine 6 MG capsule Commonly known as:  ZANAFLEX Take 1 capsule (6 mg total) by mouth 3 (three) times daily as needed for muscle spasms. What changed:  when to take this   topiramate 50 MG tablet Commonly known as:  TOPAMAX Take 50 mg by mouth daily.   valACYclovir 1000 MG tablet Commonly known as:  VALTREX Take 2,000 mg by mouth 2 (two) times daily as needed (outbreaks).   VIIBRYD 40 MG Tabs Generic drug:  Vilazodone HCl Take 40 mg by mouth daily.   Vitamin D3 5000 units Caps Take 5,000 Units by mouth daily.       Disposition: home   Final Dx: PCF C6-7  Discharge Instructions     Remove dressing in 72 hours   Complete by:  As directed    Call MD for:  difficulty breathing, headache or visual disturbances   Complete by:  As directed    Call MD for:  persistant nausea and vomiting   Complete by:  As directed    Call MD for:  redness, tenderness, or signs of infection (pain, swelling, redness, odor or green/yellow discharge around incision site)   Complete by:  As directed    Call MD for:  severe uncontrolled pain   Complete by:  As directed    Call MD for:  temperature >100.4   Complete by:  As directed    Diet - low sodium heart healthy   Complete by:  As directed    Increase activity slowly   Complete by:  As directed  SignedEustace Moore 07/02/2017, 7:43 AM

## 2017-07-09 DIAGNOSIS — L089 Local infection of the skin and subcutaneous tissue, unspecified: Secondary | ICD-10-CM | POA: Diagnosis not present

## 2017-07-15 DIAGNOSIS — Z79891 Long term (current) use of opiate analgesic: Secondary | ICD-10-CM | POA: Diagnosis not present

## 2017-07-15 DIAGNOSIS — F1120 Opioid dependence, uncomplicated: Secondary | ICD-10-CM | POA: Diagnosis not present

## 2017-07-15 DIAGNOSIS — M25512 Pain in left shoulder: Secondary | ICD-10-CM | POA: Diagnosis not present

## 2017-07-15 DIAGNOSIS — G894 Chronic pain syndrome: Secondary | ICD-10-CM | POA: Diagnosis not present

## 2017-07-15 DIAGNOSIS — M79622 Pain in left upper arm: Secondary | ICD-10-CM | POA: Diagnosis not present

## 2017-07-15 DIAGNOSIS — M545 Low back pain: Secondary | ICD-10-CM | POA: Diagnosis not present

## 2017-07-15 DIAGNOSIS — G8929 Other chronic pain: Secondary | ICD-10-CM | POA: Diagnosis not present

## 2017-07-30 DIAGNOSIS — Z13220 Encounter for screening for lipoid disorders: Secondary | ICD-10-CM | POA: Diagnosis not present

## 2017-07-30 DIAGNOSIS — Z0000 Encounter for general adult medical examination without abnormal findings: Secondary | ICD-10-CM | POA: Diagnosis not present

## 2017-08-04 DIAGNOSIS — Z0000 Encounter for general adult medical examination without abnormal findings: Secondary | ICD-10-CM | POA: Diagnosis not present

## 2017-08-10 DIAGNOSIS — M542 Cervicalgia: Secondary | ICD-10-CM | POA: Diagnosis not present

## 2017-09-08 DIAGNOSIS — R350 Frequency of micturition: Secondary | ICD-10-CM | POA: Diagnosis not present

## 2017-09-08 DIAGNOSIS — N3946 Mixed incontinence: Secondary | ICD-10-CM | POA: Diagnosis not present

## 2017-09-15 DIAGNOSIS — R0602 Shortness of breath: Secondary | ICD-10-CM | POA: Diagnosis not present

## 2017-09-15 DIAGNOSIS — R05 Cough: Secondary | ICD-10-CM | POA: Diagnosis not present

## 2017-09-15 DIAGNOSIS — J209 Acute bronchitis, unspecified: Secondary | ICD-10-CM | POA: Diagnosis not present

## 2017-09-28 DIAGNOSIS — M25522 Pain in left elbow: Secondary | ICD-10-CM | POA: Diagnosis not present

## 2017-11-02 DIAGNOSIS — M542 Cervicalgia: Secondary | ICD-10-CM | POA: Diagnosis not present

## 2017-12-06 DIAGNOSIS — F3342 Major depressive disorder, recurrent, in full remission: Secondary | ICD-10-CM | POA: Diagnosis not present

## 2017-12-24 DIAGNOSIS — N3946 Mixed incontinence: Secondary | ICD-10-CM | POA: Diagnosis not present

## 2017-12-24 DIAGNOSIS — R3914 Feeling of incomplete bladder emptying: Secondary | ICD-10-CM | POA: Diagnosis not present

## 2017-12-24 DIAGNOSIS — R350 Frequency of micturition: Secondary | ICD-10-CM | POA: Diagnosis not present

## 2017-12-29 DIAGNOSIS — R609 Edema, unspecified: Secondary | ICD-10-CM | POA: Diagnosis not present

## 2018-01-10 DIAGNOSIS — S0501XA Injury of conjunctiva and corneal abrasion without foreign body, right eye, initial encounter: Secondary | ICD-10-CM | POA: Diagnosis not present

## 2018-01-10 DIAGNOSIS — H02813 Retained foreign body in right eye, unspecified eyelid: Secondary | ICD-10-CM | POA: Diagnosis not present

## 2018-01-11 DIAGNOSIS — K5900 Constipation, unspecified: Secondary | ICD-10-CM | POA: Diagnosis not present

## 2018-01-11 DIAGNOSIS — Z1211 Encounter for screening for malignant neoplasm of colon: Secondary | ICD-10-CM | POA: Diagnosis not present

## 2018-01-11 DIAGNOSIS — I999 Unspecified disorder of circulatory system: Secondary | ICD-10-CM | POA: Diagnosis not present

## 2018-01-11 DIAGNOSIS — R109 Unspecified abdominal pain: Secondary | ICD-10-CM | POA: Diagnosis not present

## 2018-01-12 ENCOUNTER — Other Ambulatory Visit: Payer: Self-pay | Admitting: Family Medicine

## 2018-01-12 ENCOUNTER — Ambulatory Visit
Admission: RE | Admit: 2018-01-12 | Discharge: 2018-01-12 | Disposition: A | Discharge status: Home / Self Care | Payer: BLUE CROSS/BLUE SHIELD | Source: Ambulatory Visit | Attending: Family Medicine | Admitting: Family Medicine

## 2018-01-12 DIAGNOSIS — N200 Calculus of kidney: Secondary | ICD-10-CM | POA: Diagnosis not present

## 2018-01-12 DIAGNOSIS — R109 Unspecified abdominal pain: Secondary | ICD-10-CM

## 2018-01-12 DIAGNOSIS — S0501XD Injury of conjunctiva and corneal abrasion without foreign body, right eye, subsequent encounter: Secondary | ICD-10-CM | POA: Diagnosis not present

## 2018-01-12 MED ORDER — IOPAMIDOL (ISOVUE-300) INJECTION 61%
125.0000 mL | Freq: Once | INTRAVENOUS | Status: AC | PRN
  Administered 2018-01-12: 125 mL via INTRAVENOUS

## 2018-01-17 DIAGNOSIS — Z131 Encounter for screening for diabetes mellitus: Secondary | ICD-10-CM | POA: Diagnosis not present

## 2018-01-17 DIAGNOSIS — R05 Cough: Secondary | ICD-10-CM | POA: Diagnosis not present

## 2018-01-17 DIAGNOSIS — D729 Disorder of white blood cells, unspecified: Secondary | ICD-10-CM | POA: Diagnosis not present

## 2018-01-17 DIAGNOSIS — E039 Hypothyroidism, unspecified: Secondary | ICD-10-CM | POA: Diagnosis not present

## 2018-01-18 ENCOUNTER — Other Ambulatory Visit: Payer: Self-pay | Admitting: Family Medicine

## 2018-01-18 DIAGNOSIS — R05 Cough: Secondary | ICD-10-CM

## 2018-01-18 DIAGNOSIS — R059 Cough, unspecified: Secondary | ICD-10-CM

## 2018-01-22 DIAGNOSIS — M25461 Effusion, right knee: Secondary | ICD-10-CM | POA: Diagnosis not present

## 2018-01-22 DIAGNOSIS — M25561 Pain in right knee: Secondary | ICD-10-CM | POA: Diagnosis not present

## 2018-01-24 ENCOUNTER — Ambulatory Visit
Admission: RE | Admit: 2018-01-24 | Discharge: 2018-01-24 | Disposition: A | Discharge status: Home / Self Care | Payer: BLUE CROSS/BLUE SHIELD | Source: Ambulatory Visit | Attending: Family Medicine | Admitting: Family Medicine

## 2018-01-24 DIAGNOSIS — M79606 Pain in leg, unspecified: Secondary | ICD-10-CM | POA: Diagnosis not present

## 2018-01-24 DIAGNOSIS — R918 Other nonspecific abnormal finding of lung field: Secondary | ICD-10-CM | POA: Diagnosis not present

## 2018-01-24 DIAGNOSIS — R059 Cough, unspecified: Secondary | ICD-10-CM

## 2018-01-24 DIAGNOSIS — R05 Cough: Secondary | ICD-10-CM

## 2018-01-24 DIAGNOSIS — L02419 Cutaneous abscess of limb, unspecified: Secondary | ICD-10-CM | POA: Diagnosis not present

## 2018-01-25 DIAGNOSIS — M545 Low back pain: Secondary | ICD-10-CM | POA: Diagnosis not present

## 2018-01-25 DIAGNOSIS — M25561 Pain in right knee: Secondary | ICD-10-CM | POA: Diagnosis not present

## 2018-01-25 DIAGNOSIS — M25571 Pain in right ankle and joints of right foot: Secondary | ICD-10-CM | POA: Diagnosis not present

## 2018-01-25 DIAGNOSIS — M542 Cervicalgia: Secondary | ICD-10-CM | POA: Diagnosis not present

## 2018-01-26 DIAGNOSIS — D250 Submucous leiomyoma of uterus: Secondary | ICD-10-CM | POA: Diagnosis not present

## 2018-01-28 ENCOUNTER — Ambulatory Visit (HOSPITAL_COMMUNITY)
Admission: RE | Admit: 2018-01-28 | Discharge: 2018-01-28 | Disposition: A | Discharge status: Home / Self Care | Payer: BLUE CROSS/BLUE SHIELD | Source: Ambulatory Visit | Attending: Cardiology | Admitting: Cardiology

## 2018-01-28 ENCOUNTER — Other Ambulatory Visit (HOSPITAL_COMMUNITY): Payer: Self-pay | Admitting: Family Medicine

## 2018-01-28 DIAGNOSIS — M79604 Pain in right leg: Secondary | ICD-10-CM | POA: Insufficient documentation

## 2018-01-28 DIAGNOSIS — M25571 Pain in right ankle and joints of right foot: Secondary | ICD-10-CM | POA: Diagnosis not present

## 2018-01-31 ENCOUNTER — Other Ambulatory Visit: Payer: Self-pay | Admitting: Obstetrics and Gynecology

## 2018-01-31 DIAGNOSIS — N6320 Unspecified lump in the left breast, unspecified quadrant: Secondary | ICD-10-CM

## 2018-02-01 DIAGNOSIS — M79671 Pain in right foot: Secondary | ICD-10-CM | POA: Diagnosis not present

## 2018-02-02 ENCOUNTER — Other Ambulatory Visit: Payer: Self-pay

## 2018-02-02 DIAGNOSIS — I999 Unspecified disorder of circulatory system: Secondary | ICD-10-CM

## 2018-02-02 DIAGNOSIS — Z131 Encounter for screening for diabetes mellitus: Secondary | ICD-10-CM | POA: Diagnosis not present

## 2018-02-02 DIAGNOSIS — Z13220 Encounter for screening for lipoid disorders: Secondary | ICD-10-CM | POA: Diagnosis not present

## 2018-02-02 DIAGNOSIS — D729 Disorder of white blood cells, unspecified: Secondary | ICD-10-CM | POA: Diagnosis not present

## 2018-02-02 DIAGNOSIS — E039 Hypothyroidism, unspecified: Secondary | ICD-10-CM | POA: Diagnosis not present

## 2018-02-02 DIAGNOSIS — R609 Edema, unspecified: Secondary | ICD-10-CM | POA: Diagnosis not present

## 2018-02-03 ENCOUNTER — Ambulatory Visit
Admission: RE | Admit: 2018-02-03 | Discharge: 2018-02-03 | Disposition: A | Discharge status: Home / Self Care | Payer: BLUE CROSS/BLUE SHIELD | Source: Ambulatory Visit | Attending: Obstetrics and Gynecology | Admitting: Obstetrics and Gynecology

## 2018-02-03 ENCOUNTER — Ambulatory Visit: Payer: BLUE CROSS/BLUE SHIELD

## 2018-02-03 DIAGNOSIS — R922 Inconclusive mammogram: Secondary | ICD-10-CM | POA: Diagnosis not present

## 2018-02-03 DIAGNOSIS — N6320 Unspecified lump in the left breast, unspecified quadrant: Secondary | ICD-10-CM

## 2018-02-08 DIAGNOSIS — M25562 Pain in left knee: Secondary | ICD-10-CM | POA: Diagnosis not present

## 2018-02-08 DIAGNOSIS — M25571 Pain in right ankle and joints of right foot: Secondary | ICD-10-CM | POA: Diagnosis not present

## 2018-02-09 DIAGNOSIS — D259 Leiomyoma of uterus, unspecified: Secondary | ICD-10-CM | POA: Diagnosis not present

## 2018-02-10 DIAGNOSIS — M2242 Chondromalacia patellae, left knee: Secondary | ICD-10-CM | POA: Diagnosis not present

## 2018-02-17 ENCOUNTER — Encounter: Payer: BLUE CROSS/BLUE SHIELD | Admitting: Vascular Surgery

## 2018-02-17 ENCOUNTER — Encounter: Payer: Self-pay | Admitting: Vascular Surgery

## 2018-02-17 ENCOUNTER — Ambulatory Visit (HOSPITAL_COMMUNITY): Payer: BLUE CROSS/BLUE SHIELD

## 2018-02-23 ENCOUNTER — Encounter (HOSPITAL_COMMUNITY): Payer: BLUE CROSS/BLUE SHIELD

## 2018-02-23 ENCOUNTER — Encounter: Payer: BLUE CROSS/BLUE SHIELD | Admitting: Vascular Surgery

## 2018-03-11 ENCOUNTER — Ambulatory Visit (INDEPENDENT_AMBULATORY_CARE_PROVIDER_SITE_OTHER): Payer: BLUE CROSS/BLUE SHIELD

## 2018-03-11 ENCOUNTER — Ambulatory Visit (INDEPENDENT_AMBULATORY_CARE_PROVIDER_SITE_OTHER): Payer: BLUE CROSS/BLUE SHIELD | Admitting: Podiatry

## 2018-03-11 ENCOUNTER — Encounter: Payer: Self-pay | Admitting: Podiatry

## 2018-03-11 DIAGNOSIS — M79672 Pain in left foot: Secondary | ICD-10-CM | POA: Diagnosis not present

## 2018-03-11 DIAGNOSIS — M79671 Pain in right foot: Secondary | ICD-10-CM

## 2018-03-11 DIAGNOSIS — R600 Localized edema: Secondary | ICD-10-CM | POA: Diagnosis not present

## 2018-03-12 NOTE — Progress Notes (Signed)
Subjective:   Patient ID: [name], female   DOB: 47 y.o.   MRN: [id number]   HPI Patient presents stating that she is having a lot of pain in her right foot and its been swelling worse over the last few weeks.  Patient did fall in a store at the end of July and twisted her feet and it seems like the swelling has intensified since that   ROS      Objective:  Physical Exam  Neurovascular status intact with patient found to have edematous right foot with discomfort on top of the foot and soreness with palpation.  Patient has +2 pitting edema in the foot and midfoot and +1 in the ankle and negative Homans sign in the left foot is also mildly tender but not to the same degree     Assessment:  Possibility for inflammatory process of the right foot for fracture with significant localized edema     Plan:  H&P x-ray reviewed and today I went ahead applied Unna boot Ace wrap surgical shoe to try to reduce the edema present and discussed possible fascial type stockings to control swelling.  Patient will be seen back 2 weeks to see how she responds to this and will continue with compression after removal of the Unna boot  X-rays were negative for signs of fracture associated with this and indicated soft tissue distention

## 2018-03-15 DIAGNOSIS — M47817 Spondylosis without myelopathy or radiculopathy, lumbosacral region: Secondary | ICD-10-CM | POA: Diagnosis not present

## 2018-03-15 DIAGNOSIS — M5416 Radiculopathy, lumbar region: Secondary | ICD-10-CM | POA: Diagnosis not present

## 2018-03-15 DIAGNOSIS — M5127 Other intervertebral disc displacement, lumbosacral region: Secondary | ICD-10-CM | POA: Diagnosis not present

## 2018-03-18 DIAGNOSIS — E8941 Symptomatic postprocedural ovarian failure: Secondary | ICD-10-CM | POA: Diagnosis not present

## 2018-03-22 DIAGNOSIS — E559 Vitamin D deficiency, unspecified: Secondary | ICD-10-CM | POA: Diagnosis not present

## 2018-03-22 DIAGNOSIS — R252 Cramp and spasm: Secondary | ICD-10-CM | POA: Diagnosis not present

## 2018-03-22 DIAGNOSIS — K219 Gastro-esophageal reflux disease without esophagitis: Secondary | ICD-10-CM | POA: Diagnosis not present

## 2018-03-22 DIAGNOSIS — R609 Edema, unspecified: Secondary | ICD-10-CM | POA: Diagnosis not present

## 2018-03-24 DIAGNOSIS — M7711 Lateral epicondylitis, right elbow: Secondary | ICD-10-CM | POA: Diagnosis not present

## 2018-03-24 DIAGNOSIS — M25521 Pain in right elbow: Secondary | ICD-10-CM | POA: Diagnosis not present

## 2018-03-25 ENCOUNTER — Encounter: Payer: Self-pay | Admitting: Podiatry

## 2018-03-25 ENCOUNTER — Ambulatory Visit (INDEPENDENT_AMBULATORY_CARE_PROVIDER_SITE_OTHER): Payer: BLUE CROSS/BLUE SHIELD | Admitting: Podiatry

## 2018-03-25 DIAGNOSIS — R600 Localized edema: Secondary | ICD-10-CM

## 2018-03-25 DIAGNOSIS — M779 Enthesopathy, unspecified: Secondary | ICD-10-CM | POA: Diagnosis not present

## 2018-03-25 MED ORDER — TRIAMCINOLONE ACETONIDE 10 MG/ML IJ SUSP
10.0000 mg | Freq: Once | INTRAMUSCULAR | Status: AC
  Administered 2018-03-25: 10 mg

## 2018-03-25 NOTE — Patient Instructions (Addendum)
Your procedure is scheduled on: Wednesday, Oct 9  Enter through the [facility] at: 7 am   Pick up the phone at the desk and dial [phone].  Call this number if you have problems the morning of surgery: [phone].  Remember: Do NOT eat food or Do NOT drink clear liquids (including water) after midnight Tuesday.  Take these medicines the morning of surgery with a SIP OF WATER: rexulti, klonopin, cymbalta, nexium, synthroid, pepcid, bijuva, flomax, topamax, vibryd, trintellix.  May use zyrtec, flonase and valtrex if needed.  Brush your teeth on the day of surgery.  Do Not smoke on the day of surgery.  Stop herbal medications, vitamin supplements, Ibuprofen/NSAIDS at this time.  Do NOT wear jewelry (body piercing), metal hair clips/bobby pins, make-up, or nail polish. Do NOT wear lotions, powders, or perfumes.  You may wear deoderant. Do NOT shave for 48 hours prior to surgery. Do NOT bring valuables to the hospital.  Leave suitcase in car.  After surgery it may be brought to your room.  For patients admitted to the hospital, checkout time is 11:00 AM the day of discharge.  Home with Husband [name] cell [phone]

## 2018-03-28 DIAGNOSIS — Z6836 Body mass index (BMI) 36.0-36.9, adult: Secondary | ICD-10-CM | POA: Diagnosis not present

## 2018-03-28 DIAGNOSIS — M5416 Radiculopathy, lumbar region: Secondary | ICD-10-CM | POA: Diagnosis not present

## 2018-03-28 NOTE — Progress Notes (Signed)
Subjective:   Patient ID: [name], female   DOB: 47 y.o.   MRN: [id number]   HPI Patient presents stating the swelling seems to be reducing but I am still having a lot of pain in my ankle   ROS      Objective:  Physical Exam  Neurovascular status intact with the swelling of the foot reducing but there is quite a bit of discomfort in the right sinus tarsi with inflammation fluid of the sinus tarsi     Assessment:  Inflammatory capsulitis sinus tarsi right with improvement of swelling of the right lower extremity     Plan:  I went ahead and I injected the sinus tarsi today 3 mg Kenalog 5 Milgram Xylocaine applied fascial strapping to provide for reduced swelling and I gave strict instructions of any increase in swelling or calf pain or any indications shortness of breath she is to go straight to the emergency room.  Reappoint for [place] to recheck in 3 weeks

## 2018-03-30 DIAGNOSIS — D259 Leiomyoma of uterus, unspecified: Secondary | ICD-10-CM | POA: Insufficient documentation

## 2018-03-31 DIAGNOSIS — F900 Attention-deficit hyperactivity disorder, predominantly inattentive type: Secondary | ICD-10-CM | POA: Diagnosis not present

## 2018-03-31 DIAGNOSIS — F331 Major depressive disorder, recurrent, moderate: Secondary | ICD-10-CM | POA: Diagnosis not present

## 2018-03-31 DIAGNOSIS — F410 Panic disorder [episodic paroxysmal anxiety] without agoraphobia: Secondary | ICD-10-CM | POA: Diagnosis not present

## 2018-04-01 ENCOUNTER — Other Ambulatory Visit: Payer: Self-pay

## 2018-04-01 ENCOUNTER — Encounter (HOSPITAL_COMMUNITY): Payer: Self-pay

## 2018-04-01 ENCOUNTER — Encounter (HOSPITAL_COMMUNITY)
Admission: RE | Admit: 2018-04-01 | Discharge: 2018-04-01 | Disposition: A | Discharge status: Home / Self Care | Payer: BLUE CROSS/BLUE SHIELD | Source: Ambulatory Visit | Attending: Obstetrics and Gynecology | Admitting: Obstetrics and Gynecology

## 2018-04-01 DIAGNOSIS — Z01818 Encounter for other preprocedural examination: Secondary | ICD-10-CM | POA: Insufficient documentation

## 2018-04-01 HISTORY — DX: Herpesviral infection, unspecified: B00.9

## 2018-04-01 HISTORY — DX: Pre-excitation syndrome: I45.6

## 2018-04-01 HISTORY — DX: Other specified behavioral and emotional disorders with onset usually occurring in childhood and adolescence: F98.8

## 2018-04-01 HISTORY — DX: Other complications of anesthesia, initial encounter: T88.59XA

## 2018-04-01 HISTORY — DX: Adverse effect of unspecified anesthetic, initial encounter: T41.45XA

## 2018-04-01 LAB — COMPREHENSIVE METABOLIC PANEL
ALK PHOS: 83 U/L (ref 38–126)
ALT: 26 U/L (ref 0–44)
ANION GAP: 9 (ref 5–15)
AST: 21 U/L (ref 15–41)
Albumin: 4.1 g/dL (ref 3.5–5.0)
BUN: 14 mg/dL (ref 6–20)
CALCIUM: 9.4 mg/dL (ref 8.9–10.3)
CO2: 28 mmol/L (ref 22–32)
Chloride: 101 mmol/L (ref 98–111)
Creatinine, Ser: 0.97 mg/dL (ref 0.44–1.00)
GFR calc Af Amer: >60 mL/min (ref >60)
GFR calc non Af Amer: >60 mL/min (ref >60)
Glucose, Bld: 95 mg/dL (ref 70–99)
Potassium: 4 mmol/L (ref 3.5–5.1)
SODIUM: 138 mmol/L (ref 135–145)
TOTAL PROTEIN: 7.6 g/dL (ref 6.5–8.1)
Total Bilirubin: 0.3 mg/dL (ref 0.3–1.2)

## 2018-04-01 LAB — CBC
HCT: 44.7 % (ref 36.0–46.0)
HEMOGLOBIN: 14.6 g/dL (ref 12.0–15.0)
MCH: 28.4 pg (ref 26.0–34.0)
MCHC: 32.7 g/dL (ref 30.0–36.0)
MCV: 87 fL (ref 78.0–100.0)
Platelets: 235 10*3/uL (ref 150–400)
RBC: 5.14 MIL/uL — ABNORMAL HIGH (ref 3.87–5.11)
RDW: 13.7 % (ref 11.5–15.5)
WBC: 11.5 10*3/uL — AB (ref 4.0–10.5)

## 2018-04-01 LAB — ABO/RH: ABO/RH(D): O NEG

## 2018-04-05 DIAGNOSIS — M25521 Pain in right elbow: Secondary | ICD-10-CM | POA: Diagnosis not present

## 2018-04-05 NOTE — H&P (Signed)
[name] is an 47 y.o. female G0 wand a large ho presents for scheduled hysterectomy for pelvic pain and a rapidly enlarging fibroid that grew from 3+cm to 9+cm in the course of about 3 years. SHe had a h/o heavy cycles and perimenopausal symptoms and was placed on low dose OCP's when she quit smoking which controlled her symptoms well.   Unfortunately, she began smoking again and was not a candidate for OCP's any longer.  She went to the ED for pelvic pain in 7/19 and had a CT scan which revealed possible diverticulosis, and a 9-10cm uterine mass.  An office [place] confirmed this and that the mass was intramural and not subserosal.   She also has a h/o infertility with possible endometriosis. She had attempted repro/endo fertility treatments but never conceived.   Recentl she has been on Bijuvia to control hot flashes and menopausal symptoms and an [place] level 9/19 was 38 c/w perimenopause.  Pertinent Gynecological History:  OB History: G0   Menstrual History:  No LMP recorded. Patient is perimenopausal.    Past Medical History:  Diagnosis Date  . ADD (attention deficit disorder)   . Anxiety   . Breast mass 02/2014  . Cervical pseudoarthrosis ([place])   . Chronic back pain   . Chronic constipation   . Complication of anesthesia    after 07/01/2017 cervical c5-t2, pt states she had hallucination x 2 wks after surgery  . Depression   . Fibromyalgia   . GERD (gastroesophageal reflux disease)   . H/O cardiac radiofrequency ablation 1996  . Headache   . History of kidney stones    one lodged in right kidney - 29mm   . HSV infection   . Hyperthyroidism    at on time - now hypothyroidism  . Hypothyroidism   . Neuromuscular disorder (HCC)    sciatica   . 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 Sleep apnea    CPAP used nightly  . Wolff-Parkinson-White (WPW) syndrome   . Wolff-Parkinson-White pattern 1994   had ablation surgery    Past Surgical History:  Procedure  Laterality Date  . BACK SURGERY     x 4 lumbar, 3 cervical  . CARDIAC ELECTROPHYSIOLOGY MAPPING AND ABLATION     WPW  . CERVICAL SPINE SURGERY     fusions x 3  . COLONOSCOPY  04/06/2013  . ESOPHAGOGASTRODUODENOSCOPY  04/06/2013  . LUMBAR LAMINECTOMY/DECOMPRESSION MICRODISCECTOMY Right 09/02/2016   Procedure: RIGHT LUMBAR FIVE-SACRAL ONE REDO MICRODISCECTOMY;  Surgeon: Eustace Moore, MD;  Location: [place];  Service: Neurosurgery;  Laterality: Right;  . LUMBAR SPINE SURGERY     x 4  . POSTERIOR CERVICAL FUSION/FORAMINOTOMY N/A 07/01/2017   Procedure: Posterior Cervical Fusion with lateral mass fixation Cervical Six-Seven;  Surgeon: Eustace Moore, MD;  Location: [place];  Service: Neurosurgery;  Laterality: N/A;  Posterior Cervical Fusion with lateral mass fixation Cervical Six-Seven  . SIGMOIDOSCOPY    . THYROIDECTOMY    . TONSILLECTOMY    . WISDOM TOOTH EXTRACTION      Family History  Problem Relation Age of Onset  . Colon polyps Mother   . Mitral valve prolapse Mother   . High Cholesterol Mother   . Cardiomyopathy Brother     Social History:  reports that she has been smoking cigarettes. She has a 10.50 pack-year smoking history. She has quit using smokeless tobacco. She reports that she does not drink alcohol or use drugs.  Allergies:  Allergies  Allergen Reactions  . Dilaudid [Hydromorphone Hcl] Palpitations    Heart palpitations   . Phentermine Palpitations    TACHYCARDIA  . Shellfish Allergy Hives    SHRIMP   . Lactose Intolerance (Gi) Diarrhea    GI Distress    No medications prior to admission.    Review of Systems  Cardiovascular: Negative for chest pain.  Gastrointestinal: Positive for abdominal pain.  Musculoskeletal: Negative for neck pain.    There were no vitals taken for this visit. Physical Exam  Constitutional: She appears well-developed.  Cardiovascular: Normal rate and regular rhythm.  Respiratory: Effort normal.  GI: Soft.  Genitourinary: Vagina  normal.  Genitourinary Comments: Uterus enlarged with posterior fibroid Poor descensus of cervix  Neurological: She is alert.  Psychiatric: She has a normal mood and affect.    No results found for this or any previous visit (from the past 24 hour(s)).  No results found.  Assessment/Plan: Pt was counseled on the risks and benfits of TLH/BSO/Possible TAH/BSO including bleeding, infection, and possible damage to bowel and bladder or ureters. She understands she can retain her ovaries, but does not want to given sx already present and will reduce risk of ovarian cancer. She would accept a transfusion if needed. We reviewed the procedure in detail including laparoscopic vs vaginal closure of cuff, as well as possible need for a laparotomy to remove the fibroid intact.  It is also possible with her h/o infertility she has ednometriosis and scarring.   We discussed a possible incision in the event of any complication and possible delayed recovery from that. She is ready to proceed.  Logan Bores 04/05/2018, 9:14 PM

## 2018-04-06 ENCOUNTER — Inpatient Hospital Stay (HOSPITAL_COMMUNITY)
Admission: AD | Admit: 2018-04-06 | Discharge: 2018-04-07 | DRG: 743 | Disposition: A | Discharge status: Home / Self Care | Payer: BLUE CROSS/BLUE SHIELD | Attending: Obstetrics and Gynecology | Admitting: Obstetrics and Gynecology

## 2018-04-06 ENCOUNTER — Encounter (HOSPITAL_COMMUNITY): Payer: Self-pay | Admitting: *Deleted

## 2018-04-06 ENCOUNTER — Encounter (HOSPITAL_COMMUNITY)
Admission: AD | Disposition: A | Discharge status: Home / Self Care | Payer: Self-pay | Source: Home / Self Care | Attending: Obstetrics and Gynecology

## 2018-04-06 ENCOUNTER — Other Ambulatory Visit: Payer: Self-pay

## 2018-04-06 ENCOUNTER — Ambulatory Visit (HOSPITAL_COMMUNITY): Payer: BLUE CROSS/BLUE SHIELD | Admitting: Anesthesiology

## 2018-04-06 DIAGNOSIS — Z6837 Body mass index (BMI) 37.0-37.9, adult: Secondary | ICD-10-CM | POA: Diagnosis not present

## 2018-04-06 DIAGNOSIS — E669 Obesity, unspecified: Secondary | ICD-10-CM | POA: Diagnosis present

## 2018-04-06 DIAGNOSIS — R102 Pelvic and perineal pain: Secondary | ICD-10-CM | POA: Diagnosis present

## 2018-04-06 DIAGNOSIS — F17210 Nicotine dependence, cigarettes, uncomplicated: Secondary | ICD-10-CM | POA: Diagnosis present

## 2018-04-06 DIAGNOSIS — D259 Leiomyoma of uterus, unspecified: Secondary | ICD-10-CM | POA: Diagnosis not present

## 2018-04-06 DIAGNOSIS — Z90710 Acquired absence of both cervix and uterus: Secondary | ICD-10-CM | POA: Diagnosis present

## 2018-04-06 DIAGNOSIS — N736 Female pelvic peritoneal adhesions (postinfective): Secondary | ICD-10-CM | POA: Diagnosis not present

## 2018-04-06 DIAGNOSIS — G4730 Sleep apnea, unspecified: Secondary | ICD-10-CM | POA: Diagnosis present

## 2018-04-06 HISTORY — PX: LAPAROSCOPY: SHX197

## 2018-04-06 HISTORY — PX: CYSTOSCOPY: SHX5120

## 2018-04-06 HISTORY — PX: SUPRACERVICAL ABDOMINAL HYSTERECTOMY: SHX5393

## 2018-04-06 LAB — CBC
HEMATOCRIT: 33.9 % — AB (ref 36.0–46.0)
Hemoglobin: 11.1 g/dL — ABNORMAL LOW (ref 12.0–15.0)
MCH: 28.4 pg (ref 26.0–34.0)
MCHC: 32.7 g/dL (ref 30.0–36.0)
MCV: 86.7 fL (ref 80.0–100.0)
NRBC: 0 % (ref 0.0–0.2)
Platelets: 242 10*3/uL (ref 150–400)
RBC: 3.91 MIL/uL (ref 3.87–5.11)
RDW: 13.9 % (ref 11.5–15.5)
WBC: 22.3 10*3/uL — AB (ref 4.0–10.5)

## 2018-04-06 LAB — PREPARE RBC (CROSSMATCH)

## 2018-04-06 SURGERY — LAPAROSCOPY, DIAGNOSTIC
Anesthesia: General | Site: Bladder

## 2018-04-06 MED ORDER — SCOPOLAMINE 1 MG/3DAYS TD PT72
MEDICATED_PATCH | TRANSDERMAL | Status: AC
  Administered 2018-04-06: 1.5 mg via TRANSDERMAL
  Filled 2018-04-06: qty 1

## 2018-04-06 MED ORDER — FENTANYL CITRATE (PF) 100 MCG/2ML IJ SOLN
INTRAMUSCULAR | Status: AC
  Filled 2018-04-06: qty 2

## 2018-04-06 MED ORDER — LACTATED RINGERS IV SOLN: INTRAVENOUS | Status: DC

## 2018-04-06 MED ORDER — DIPHENHYDRAMINE HCL 50 MG/ML IJ SOLN: 12.5000 mg | Freq: Four times a day (QID) | INTRAMUSCULAR | Status: DC | PRN

## 2018-04-06 MED ORDER — OXYCODONE HCL 5 MG PO TABS
10.0000 mg | ORAL_TABLET | ORAL | Status: DC | PRN
  Administered 2018-04-07: 10 mg via ORAL
  Filled 2018-04-06: qty 2

## 2018-04-06 MED ORDER — FLUORESCEIN SODIUM 10 % IV SOLN
INTRAVENOUS | Status: DC | PRN
  Administered 2018-04-06: 100 mg via INTRAVENOUS

## 2018-04-06 MED ORDER — MIDAZOLAM HCL 2 MG/2ML IJ SOLN
INTRAMUSCULAR | Status: DC | PRN
  Administered 2018-04-06: 2 mg via INTRAVENOUS

## 2018-04-06 MED ORDER — ACETAMINOPHEN 10 MG/ML IV SOLN
1000.0000 mg | Freq: Once | INTRAVENOUS | Status: DC | PRN
  Administered 2018-04-06: 1000 mg via INTRAVENOUS

## 2018-04-06 MED ORDER — LACTATED RINGERS IV SOLN
INTRAVENOUS | Status: DC
  Administered 2018-04-06 – 2018-04-07 (×2): via INTRAVENOUS

## 2018-04-06 MED ORDER — FLUTICASONE PROPIONATE 50 MCG/ACT NA SUSP
2.0000 | Freq: Every day | NASAL | Status: DC
  Administered 2018-04-06: 2 via NASAL
  Filled 2018-04-06: qty 16

## 2018-04-06 MED ORDER — SUGAMMADEX SODIUM 200 MG/2ML IV SOLN
INTRAVENOUS | Status: AC
  Filled 2018-04-06: qty 2

## 2018-04-06 MED ORDER — PHENYLEPHRINE 40 MCG/ML (10ML) SYRINGE FOR IV PUSH (FOR BLOOD PRESSURE SUPPORT)
PREFILLED_SYRINGE | INTRAVENOUS | Status: AC
  Filled 2018-04-06: qty 10

## 2018-04-06 MED ORDER — BREXPIPRAZOLE 2 MG PO TABS
2.0000 mg | ORAL_TABLET | Freq: Every day | ORAL | Status: DC
  Administered 2018-04-07: 2 mg via ORAL

## 2018-04-06 MED ORDER — PROMETHAZINE HCL 25 MG/ML IJ SOLN: 6.2500 mg | INTRAMUSCULAR | Status: DC | PRN

## 2018-04-06 MED ORDER — ONDANSETRON HCL 4 MG/2ML IJ SOLN: 4.0000 mg | Freq: Four times a day (QID) | INTRAMUSCULAR | Status: DC | PRN

## 2018-04-06 MED ORDER — KETOROLAC TROMETHAMINE 30 MG/ML IJ SOLN: 30.0000 mg | Freq: Four times a day (QID) | INTRAMUSCULAR | Status: DC

## 2018-04-06 MED ORDER — FAMOTIDINE 20 MG PO TABS
20.0000 mg | ORAL_TABLET | Freq: Every day | ORAL | Status: DC
  Administered 2018-04-07: 20 mg via ORAL
  Filled 2018-04-06: qty 1

## 2018-04-06 MED ORDER — SODIUM CHLORIDE 0.9 % IJ SOLN
INTRAMUSCULAR | Status: AC
  Filled 2018-04-06: qty 20

## 2018-04-06 MED ORDER — NALOXONE HCL 0.4 MG/ML IJ SOLN: 0.4000 mg | INTRAMUSCULAR | Status: DC | PRN

## 2018-04-06 MED ORDER — PHENYLEPHRINE HCL 10 MG/ML IJ SOLN
INTRAMUSCULAR | Status: DC | PRN
  Administered 2018-04-06 (×4): 80 ug via INTRAVENOUS

## 2018-04-06 MED ORDER — FENTANYL CITRATE (PF) 100 MCG/2ML IJ SOLN
INTRAMUSCULAR | Status: DC | PRN
  Administered 2018-04-06 (×2): 50 ug via INTRAVENOUS
  Administered 2018-04-06: 100 ug via INTRAVENOUS
  Administered 2018-04-06: 50 ug via INTRAVENOUS
  Administered 2018-04-06: 25 ug via INTRAVENOUS
  Administered 2018-04-06: 50 ug via INTRAVENOUS
  Administered 2018-04-06: 25 ug via INTRAVENOUS
  Administered 2018-04-06: 50 ug via INTRAVENOUS
  Administered 2018-04-06: 100 ug via INTRAVENOUS

## 2018-04-06 MED ORDER — ACETAMINOPHEN 10 MG/ML IV SOLN
INTRAVENOUS | Status: AC
  Filled 2018-04-06: qty 100

## 2018-04-06 MED ORDER — LIDOCAINE HCL (CARDIAC) PF 100 MG/5ML IV SOSY
PREFILLED_SYRINGE | INTRAVENOUS | Status: DC | PRN
  Administered 2018-04-06 (×2): 50 mg via INTRAVENOUS

## 2018-04-06 MED ORDER — MENTHOL 3 MG MT LOZG: 1.0000 | LOZENGE | OROMUCOSAL | Status: DC | PRN

## 2018-04-06 MED ORDER — KETOROLAC TROMETHAMINE 30 MG/ML IJ SOLN
INTRAMUSCULAR | Status: AC
  Filled 2018-04-06: qty 1

## 2018-04-06 MED ORDER — SIMETHICONE 80 MG PO CHEW: 80.0000 mg | CHEWABLE_TABLET | Freq: Four times a day (QID) | ORAL | Status: DC | PRN

## 2018-04-06 MED ORDER — ROCURONIUM BROMIDE 100 MG/10ML IV SOLN
INTRAVENOUS | Status: DC | PRN
  Administered 2018-04-06 (×2): 10 mg via INTRAVENOUS
  Administered 2018-04-06: 5 mg via INTRAVENOUS
  Administered 2018-04-06: 10 mg via INTRAVENOUS
  Administered 2018-04-06: 45 mg via INTRAVENOUS
  Administered 2018-04-06: 5 mg via INTRAVENOUS
  Administered 2018-04-06 (×3): 10 mg via INTRAVENOUS

## 2018-04-06 MED ORDER — PROPOFOL 10 MG/ML IV BOLUS
INTRAVENOUS | Status: DC | PRN
  Administered 2018-04-06: 150 mg via INTRAVENOUS
  Administered 2018-04-06: 50 mg via INTRAVENOUS

## 2018-04-06 MED ORDER — SCOPOLAMINE 1 MG/3DAYS TD PT72
1.0000 | MEDICATED_PATCH | Freq: Once | TRANSDERMAL | Status: DC
  Administered 2018-04-06: 1.5 mg via TRANSDERMAL

## 2018-04-06 MED ORDER — LACTATED RINGERS IV SOLN
INTRAVENOUS | Status: DC
  Administered 2018-04-06: 125 mL/h via INTRAVENOUS
  Administered 2018-04-06 (×3): via INTRAVENOUS

## 2018-04-06 MED ORDER — ENOXAPARIN SODIUM 40 MG/0.4ML ~~LOC~~ SOLN
40.0000 mg | SUBCUTANEOUS | Status: DC
  Administered 2018-04-07: 40 mg via SUBCUTANEOUS
  Filled 2018-04-06: qty 0.4

## 2018-04-06 MED ORDER — TOPIRAMATE 25 MG PO TABS
25.0000 mg | ORAL_TABLET | Freq: Every day | ORAL | Status: DC
  Administered 2018-04-07: 25 mg via ORAL
  Filled 2018-04-06 (×2): qty 1

## 2018-04-06 MED ORDER — VILAZODONE HCL 40 MG PO TABS
20.0000 mg | ORAL_TABLET | Freq: Every day | ORAL | Status: DC
  Administered 2018-04-07: 20 mg via ORAL

## 2018-04-06 MED ORDER — ONDANSETRON HCL 4 MG PO TABS: 4.0000 mg | ORAL_TABLET | Freq: Four times a day (QID) | ORAL | Status: DC | PRN

## 2018-04-06 MED ORDER — ROCURONIUM BROMIDE 100 MG/10ML IV SOLN
INTRAVENOUS | Status: AC
  Filled 2018-04-06: qty 1

## 2018-04-06 MED ORDER — MORPHINE SULFATE 2 MG/ML IV SOLN
INTRAVENOUS | Status: DC
  Administered 2018-04-06: 16:00:00 via INTRAVENOUS
  Administered 2018-04-06: 3.5 mL via INTRAVENOUS
  Administered 2018-04-06: 0.5 mg via INTRAVENOUS
  Administered 2018-04-07: 4.5 mg via INTRAVENOUS
  Administered 2018-04-07: 5 mg via INTRAVENOUS
  Administered 2018-04-07: 2 mL via INTRAVENOUS
  Filled 2018-04-06: qty 30

## 2018-04-06 MED ORDER — ONDANSETRON HCL 4 MG/2ML IJ SOLN
INTRAMUSCULAR | Status: DC | PRN
  Administered 2018-04-06: 4 mg via INTRAVENOUS

## 2018-04-06 MED ORDER — SODIUM CHLORIDE 0.9 % IV SOLN: 10.0000 mL/h | Freq: Once | INTRAVENOUS | Status: DC

## 2018-04-06 MED ORDER — VORTIOXETINE HBR 10 MG PO TABS
10.0000 mg | ORAL_TABLET | Freq: Every day | ORAL | Status: DC
  Administered 2018-04-07: 10 mg via ORAL

## 2018-04-06 MED ORDER — SUGAMMADEX SODIUM 200 MG/2ML IV SOLN
INTRAVENOUS | Status: DC | PRN
  Administered 2018-04-06: 200 mg via INTRAVENOUS

## 2018-04-06 MED ORDER — POTASSIUM CHLORIDE CRYS ER 20 MEQ PO TBCR
20.0000 meq | EXTENDED_RELEASE_TABLET | Freq: Two times a day (BID) | ORAL | Status: DC
  Administered 2018-04-07: 20 meq via ORAL
  Filled 2018-04-06 (×2): qty 1

## 2018-04-06 MED ORDER — ALBUMIN HUMAN 5 % IV SOLN
INTRAVENOUS | Status: AC
  Filled 2018-04-06: qty 250

## 2018-04-06 MED ORDER — CEFAZOLIN SODIUM-DEXTROSE 2-4 GM/100ML-% IV SOLN
2.0000 g | INTRAVENOUS | Status: AC
  Administered 2018-04-06 (×2): 2 g via INTRAVENOUS

## 2018-04-06 MED ORDER — DIPHENHYDRAMINE HCL 12.5 MG/5ML PO ELIX: 12.5000 mg | ORAL_SOLUTION | Freq: Four times a day (QID) | ORAL | Status: DC | PRN

## 2018-04-06 MED ORDER — DEXAMETHASONE SODIUM PHOSPHATE 10 MG/ML IJ SOLN
INTRAMUSCULAR | Status: DC | PRN
  Administered 2018-04-06: 10 mg via INTRAVENOUS

## 2018-04-06 MED ORDER — FENTANYL CITRATE (PF) 100 MCG/2ML IJ SOLN
25.0000 ug | INTRAMUSCULAR | Status: DC | PRN
  Administered 2018-04-06 (×3): 50 ug via INTRAVENOUS

## 2018-04-06 MED ORDER — ALBUMIN HUMAN 5 % IV SOLN
INTRAVENOUS | Status: DC | PRN
  Administered 2018-04-06 (×2): via INTRAVENOUS

## 2018-04-06 MED ORDER — BUPIVACAINE HCL (PF) 0.25 % IJ SOLN
INTRAMUSCULAR | Status: AC
  Filled 2018-04-06: qty 30

## 2018-04-06 MED ORDER — OXYCODONE HCL 5 MG/5ML PO SOLN: 5.0000 mg | Freq: Once | ORAL | Status: DC | PRN

## 2018-04-06 MED ORDER — LIDOCAINE HCL (CARDIAC) PF 100 MG/5ML IV SOSY
PREFILLED_SYRINGE | INTRAVENOUS | Status: AC
  Filled 2018-04-06: qty 5

## 2018-04-06 MED ORDER — MAGNESIUM HYDROXIDE 400 MG/5ML PO SUSP: 30.0000 mL | Freq: Every day | ORAL | Status: DC | PRN

## 2018-04-06 MED ORDER — LEVOTHYROXINE SODIUM 175 MCG PO TABS
175.0000 ug | ORAL_TABLET | Freq: Every day | ORAL | Status: DC
  Administered 2018-04-07: 175 ug via ORAL
  Filled 2018-04-06: qty 1

## 2018-04-06 MED ORDER — 0.9 % SODIUM CHLORIDE (POUR BTL) OPTIME
TOPICAL | Status: DC | PRN
  Administered 2018-04-06 (×3): 1000 mL

## 2018-04-06 MED ORDER — DULOXETINE HCL 60 MG PO CPEP
60.0000 mg | ORAL_CAPSULE | Freq: Every day | ORAL | Status: DC
  Administered 2018-04-07: 60 mg via ORAL
  Filled 2018-04-06 (×2): qty 1

## 2018-04-06 MED ORDER — OXYCODONE HCL 5 MG PO TABS: 5.0000 mg | ORAL_TABLET | Freq: Once | ORAL | Status: DC | PRN

## 2018-04-06 MED ORDER — KETOROLAC TROMETHAMINE 30 MG/ML IJ SOLN
INTRAMUSCULAR | Status: DC | PRN
  Administered 2018-04-06: 30 mg via INTRAVENOUS

## 2018-04-06 MED ORDER — DEXAMETHASONE SODIUM PHOSPHATE 10 MG/ML IJ SOLN
INTRAMUSCULAR | Status: AC
  Filled 2018-04-06: qty 1

## 2018-04-06 MED ORDER — SENNA 8.6 MG PO TABS
1.0000 | ORAL_TABLET | Freq: Two times a day (BID) | ORAL | Status: DC
  Filled 2018-04-06 (×3): qty 1

## 2018-04-06 MED ORDER — BUPIVACAINE HCL (PF) 0.25 % IJ SOLN
INTRAMUSCULAR | Status: DC | PRN
  Administered 2018-04-06: 20 mL

## 2018-04-06 MED ORDER — SODIUM CHLORIDE 0.9 % IJ SOLN
INTRAMUSCULAR | Status: DC | PRN
  Administered 2018-04-06: 10 mL

## 2018-04-06 MED ORDER — IBUPROFEN 600 MG PO TABS
600.0000 mg | ORAL_TABLET | Freq: Four times a day (QID) | ORAL | Status: DC | PRN
  Filled 2018-04-06: qty 1

## 2018-04-06 MED ORDER — SODIUM CHLORIDE 0.9% FLUSH: 9.0000 mL | INTRAVENOUS | Status: DC | PRN

## 2018-04-06 MED ORDER — MIDAZOLAM HCL 2 MG/2ML IJ SOLN
INTRAMUSCULAR | Status: AC
  Filled 2018-04-06: qty 2

## 2018-04-06 MED ORDER — GABAPENTIN 400 MG PO CAPS
2400.0000 mg | ORAL_CAPSULE | Freq: Every day | ORAL | Status: DC
  Filled 2018-04-06: qty 6

## 2018-04-06 MED ORDER — ESTRADIOL-PROGESTERONE 1-100 MG PO CAPS
1.0000 | ORAL_CAPSULE | Freq: Every day | ORAL | Status: DC
  Administered 2018-04-07: 1 via ORAL

## 2018-04-06 MED ORDER — FLUORESCEIN SODIUM 10 % IV SOLN
INTRAVENOUS | Status: AC
  Filled 2018-04-06: qty 5

## 2018-04-06 MED ORDER — FENTANYL CITRATE (PF) 250 MCG/5ML IJ SOLN
INTRAMUSCULAR | Status: AC
  Filled 2018-04-06: qty 5

## 2018-04-06 MED ORDER — FENTANYL CITRATE (PF) 100 MCG/2ML IJ SOLN
INTRAMUSCULAR | Status: AC
  Administered 2018-04-06: 50 ug via INTRAVENOUS
  Filled 2018-04-06: qty 2

## 2018-04-06 MED ORDER — PROPOFOL 10 MG/ML IV BOLUS
INTRAVENOUS | Status: AC
  Filled 2018-04-06: qty 20

## 2018-04-06 MED ORDER — ONDANSETRON HCL 4 MG/2ML IJ SOLN
INTRAMUSCULAR | Status: AC
  Filled 2018-04-06: qty 2

## 2018-04-06 SURGICAL SUPPLY — 62 items
ADH SKN CLS APL DERMABOND .7 (GAUZE/BANDAGES/DRESSINGS) ×4
APL SKNCLS STERI-STRIP NONHPOA (GAUZE/BANDAGES/DRESSINGS) ×4
BARRIER ADHS 3X4 INTERCEED (GAUZE/BANDAGES/DRESSINGS) ×2 IMPLANT
BENZOIN TINCTURE PRP APPL 2/3 (GAUZE/BANDAGES/DRESSINGS) ×2 IMPLANT
BLADE SURG 10 STRL SS (BLADE) ×4 IMPLANT
BRR ADH 4X3 ABS CNTRL BYND (GAUZE/BANDAGES/DRESSINGS) ×4
CLEANER TIP ELECTROSURG 2X2 (MISCELLANEOUS) ×2 IMPLANT
COUNTER NEEDLE 1200 MAGNETIC (NEEDLE) ×2 IMPLANT
COVER LIGHT HANDLE  1/PK (MISCELLANEOUS) ×1
COVER LIGHT HANDLE 1/PK (MISCELLANEOUS) ×1 IMPLANT
COVER TABLE BACK 60X90 (DRAPES) ×2 IMPLANT
DERMABOND ADVANCED (GAUZE/BANDAGES/DRESSINGS) ×1
DERMABOND ADVANCED .7 DNX12 (GAUZE/BANDAGES/DRESSINGS) ×4 IMPLANT
DRSG OPSITE POSTOP 4X10 (GAUZE/BANDAGES/DRESSINGS) ×2 IMPLANT
DURAPREP 26ML APPLICATOR (WOUND CARE) ×5 IMPLANT
FILTER SMOKE EVAC LAPAROSHD (FILTER) ×5 IMPLANT
GAUZE 4X4 16PLY RFD (DISPOSABLE) ×2 IMPLANT
GLOVE BIO SURGEON STRL SZ 6.5 (GLOVE) ×10 IMPLANT
GLOVE BIOGEL PI IND STRL 7.0 (GLOVE) ×8 IMPLANT
GLOVE BIOGEL PI INDICATOR 7.0 (GLOVE) ×2
GOWN STRL REUS W/TWL LRG LVL3 (GOWN DISPOSABLE) ×15 IMPLANT
HEMOSTAT ARISTA ABSORB 3G PWDR (MISCELLANEOUS) ×2 IMPLANT
NDL INSUFFLATION 14GA 120MM (NEEDLE) ×3 IMPLANT
NEEDLE INSUFFLATION 14GA 120MM (NEEDLE) ×5 IMPLANT
NS IRRIG 1000ML POUR BTL (IV SOLUTION) ×5 IMPLANT
OCCLUDER COLPOPNEUMO (BALLOONS) ×2 IMPLANT
PACK LAPAROSCOPY BASIN (CUSTOM PROCEDURE TRAY) ×5 IMPLANT
PACK TRENDGUARD 450 HYBRID PRO (MISCELLANEOUS) ×1 IMPLANT
PENCIL BUTTON HOLSTER BLD 10FT (ELECTRODE) ×2 IMPLANT
PROTECTOR NERVE ULNAR (MISCELLANEOUS) ×10 IMPLANT
RTRCTR C-SECT PINK 25CM LRG (MISCELLANEOUS) ×2 IMPLANT
SET CYSTO W/LG BORE CLAMP LF (SET/KITS/TRAYS/PACK) ×2 IMPLANT
SET IRRIG TUBING LAPAROSCOPIC (IRRIGATION / IRRIGATOR) ×5 IMPLANT
SLEEVE XCEL OPT CAN 5 100 (ENDOMECHANICALS) ×5 IMPLANT
SPONGE LAP 18X18 RF (DISPOSABLE) ×2 IMPLANT
STRIP CLOSURE SKIN 1/2X4 (GAUZE/BANDAGES/DRESSINGS) ×2 IMPLANT
SUT PLAIN 2 0 XLH (SUTURE) ×2 IMPLANT
SUT VIC AB 0 CT1 18XCR BRD8 (SUTURE) ×5 IMPLANT
SUT VIC AB 0 CT1 27 (SUTURE) ×10
SUT VIC AB 0 CT1 27XBRD ANBCTR (SUTURE) ×8 IMPLANT
SUT VIC AB 0 CT1 36 (SUTURE) ×4 IMPLANT
SUT VIC AB 0 CT1 8-18 (SUTURE) ×25
SUT VIC AB 2-0 CT1 27 (SUTURE) ×5
SUT VIC AB 2-0 CT1 TAPERPNT 27 (SUTURE) ×1 IMPLANT
SUT VIC AB 3-0 PS2 18 (SUTURE) ×5
SUT VIC AB 3-0 PS2 18XBRD (SUTURE) ×4 IMPLANT
SUT VIC AB 4-0 KS 27 (SUTURE) ×2 IMPLANT
SUT VICRYL 0 TIES 12 18 (SUTURE) ×2 IMPLANT
SUT VICRYL 0 UR6 27IN ABS (SUTURE) ×7 IMPLANT
SYR 10ML LL (SYRINGE) ×5 IMPLANT
SYR 50ML LL SCALE MARK (SYRINGE) ×5 IMPLANT
SYR BULB IRRIGATION 50ML (SYRINGE) ×2 IMPLANT
TIP UTERINE 6.7X8CM BLUE DISP (MISCELLANEOUS) ×2 IMPLANT
TOWEL OR 17X24 6PK STRL BLUE (TOWEL DISPOSABLE) ×15 IMPLANT
TRAY FOLEY W/BAG SLVR 14FR (SET/KITS/TRAYS/PACK) ×5 IMPLANT
TRENDGUARD 450 HYBRID PRO PACK (MISCELLANEOUS) ×5
TROCAR XCEL NON-BLD 11X100MML (ENDOMECHANICALS) ×5 IMPLANT
TROCAR XCEL NON-BLD 5MMX100MML (ENDOMECHANICALS) ×5 IMPLANT
TUBING CONNECTING 10 (TUBING) ×2 IMPLANT
TUBING INSUF HEATED (TUBING) ×5 IMPLANT
WARMER LAPAROSCOPE (MISCELLANEOUS) ×5 IMPLANT
YANKAUER SUCT BULB TIP NO VENT (SUCTIONS) ×2 IMPLANT

## 2018-04-06 NOTE — Anesthesia Postprocedure Evaluation (Signed)
Anesthesia Post Note  Patient: [name]  Procedure(s) Performed: LAPAROSCOPY DIAGNOSTIC (Abdomen) SUPRACERVICAL HYSTERECTOMY ABDOMINAL WITH LEFT OOPHORECTOMY AND BILATERAL SALPINGECTOMY (N/A Abdomen) CYSTOSCOPY (N/A Bladder)     Patient location during evaluation: PACU Anesthesia Type: General Level of consciousness: awake and alert Pain management: pain level controlled Vital Signs Assessment: post-procedure vital signs reviewed and stable Respiratory status: spontaneous breathing, nonlabored ventilation and respiratory function stable Cardiovascular status: blood pressure returned to baseline and stable Postop Assessment: no apparent nausea or vomiting Anesthetic complications: no    Last Vitals:  Vitals:   04/06/18 1500 04/06/18 1518  BP: 134/79 (!) 138/91  Pulse: 99 94  Resp: 12 16  Temp:  36.8 C  SpO2: 98% 100%    Last Pain:  Vitals:   04/06/18 1538  TempSrc:   PainSc: 7    Pain Goal: Patients Stated Pain Goal: 3 (04/06/18 0721)               Brennan Bailey

## 2018-04-06 NOTE — Op Note (Signed)
Operative Note    Preoperative Diagnosis Pelvic Pain Rapidly enlarging fibroid uterus  Postoperative Diagnosis Same with pelvic adhesions and fibroid invading retroperitoneal space on right sidewall  Procedure Diagnostic laparoscopy Supracervical abdominal hysterectomy with bilateral salpingectomies and left oophorectomy  Surgeon Paula Compton, MD Carlynn Purl, DO  Anesthesia GETA  Fluids: EBL 1555mL UOP 613mL IVF 2936mL Albumin 558mL  Findings The uterus was immobile and the large fibroid was adherent to the left sidewall and invaded the retroperitoneal space.  The left ovary and tube were removed and appeared WNL.  The right ovary was densely adherent to the sidewall and the tube was able to be dissected and removed. The ovary otherwise appeared normal.  The bladder and ureters were normal on cystoscopy with ureteral jets noted bilaterally  Specimen Uterus, bilateral tubes and left ovary  Procedure Patient was taken to the operating room where general anesthesia was obtained without difficulty she was prepped and draped in the normal sterile fashion in the dorsal lithotomy position. An appropriate time out was performed. A Rumi with cervical cup was placed after dilation and stay sutures placed to hold the cup onto the cervix at 3 and 6 o'clock.  The vaginal occluder was filled and a Foley catheter placed in the bladder. Attention was then turned to the abdomen where of infraumbilical incision was made after injection with quarter percent Marcaine approximate 2 cm in length.The verees needle was introduced and intraperitoneal placement confirmed with aspiration and injection of NS.  Pneumoperitoneum was then obtained with 2.5L of CO2 gas.  The 11 mm trocar was then introduced with the camera and direct visualization.   One additional 5 mm port was placed under direct visualization in  the lateral right upper quadrant..  Each port site was injected with quarter percent  Marcaine prior placement.   With patient in Trendelenburg the uterus and tubes and ovaries were inspected with findings as previously stated.   The uterus deviated to the patient's right and the right adnexa were deep in the pelvis and not visible at all due to the fibroid.  The left ovary and tube could barely be visualized.  Attempts were made to lift up the uterus with the Rumi and deflect it laterally to gain visibility, but it was completely immobile.  At this point the decision was made to convert to abdominal hysterectomy.  The instruments were removed from the abdomen as well as the lateral ports under visualization.  A pfanensteil incision was then made and carried through to the underlying fascia with sharp dissection and bovie cautery.  The fascia wasx nicked in the midline and extended laterally sharply.  The rectus muscles were separated and the peritoneum entered sharply.  The incision was extended bluntly and the Alexis retractor placed within.  The pelvis was then palpated carefully and the uterus tightly adherent to the base of the pelvis and the right adnexa tethered down deep in the right cul-de-sac. The infundibulopelvic ligament on the left was then taken down and slow steady progress made to free the broad and round ligament by hugging the uterus, clamping with Zeppelin clamps and suture ligating each step with 0-vicryl.  Because of the size of the fibroid, there were considerable feeder vessels and back bleeding that had to be controlled at each step.  Visibility was difficult due to the bulk and immobility of the uterus.  Because the fibroid invaded the retroperitoneal space, the retroperitoneum was opened over the fibroid and stay sutures placed to pull and  elevate the fibroid up and lateral as we worked.  After sloe and careful dissection the lateral side of the uterus was free on the left.  Attention was then turned to the right which was likewise slowly and tediously dissected around  the fibroid.  The right adnexa was still not visible so the uteroovarian ligament transected to make progress.  Again there was substantial backbleeding and difficult visualization, but eventually the right side was free.  The uterus was still tightly tethered posteriorly and to the cul-de-sac, so the decision was made to amputate it from the cervix.  Jorgensen scissors were used to come across anteriorly until the cervical canal was confirmed with the Rumi present.  The Rumi was then removed.  The remainder of the uterine fundus was then amputated and the cervical stump remained.   With visibility now improved the right tube was able to be dissected and removed.  Attempts were made to free the right ovary which remained densely adherent to the wall, but because of the patient's blood loss to this point, I did not want to do a deep dissection that would result in more and the ovary appeared normal.  The cervix was then oversewn across the top of the stump with 0-vicryl.   Because of the difficulty of the dissection, a cystoscopy was then performed and the bladder and ureters noted to be intact with normal jets bilaterally.  The cervix was still adherent and the decision was made to leave it to reduce any further blood loss or risk of surgical injury to the bladder. The pelvis was then copiously irrigated and no active bleeding noted.  The retroperitoneal areas that were denuded were slightly oozy so Arista was applied to the pelvic floor.  Interceed was placed across the cervical stump.   All instruments and sponges were removed and the peritoneum and rectus muscles re-approximated with a running 2-0 vicryl.  The fascia was closed with 0-vicrl the subcutaneous tissue re-approximated with 3-0 plain and the skin closed with 3-0 vicryl in a subcuticular stitch.  The port ssites were closed with 3-0 vicryl and dermabond.  All instrument and sponge counts were correct and the patient taken to PACU in stable  condition.

## 2018-04-06 NOTE — Progress Notes (Signed)
Patient ID: [name], female   DOB: [birth year], 47 y.o.   MRN: [id number] Patient a bit tearful this AM and back uncomfortable laying flat.  We reviewed the surgery in detail and she would definitely like her ovaries removed, aware this will render her menopausal.  We alos discussed laparoscopic vs open approach and she would like the approach that will give her the shortest anesthesia time and being on her back.  We agreed that we will begin laparoscopically and accomplish as much as possible, then do a minilaparotomy to remove the uterus and close the cuff.  OR staff aware.  Pt ready to proceed.

## 2018-04-06 NOTE — Addendum Note (Signed)
Addendum  created 04/06/18 1917 by Asher Muir, CRNA   Intraprocedure Meds edited

## 2018-04-06 NOTE — Progress Notes (Signed)
Day of Surgery Procedure(s) (LRB): LAPAROSCOPY DIAGNOSTIC SUPRACERVICAL HYSTERECTOMY ABDOMINAL WITH LEFT OOPHORECTOMY AND BILATERAL SALPINGECTOMY (N/A) CYSTOSCOPY (N/A)  Subjective: Patient reports incisional pain.  Also L hand feels numb.  States pain is real bad, but was 7-8/10 on arrival to unit is now 5/10.  Reeducated re PCA and pain meds.  D/W pt if we can't get ahead of pain can give extra pain meds prn.  Has been resting comfortably per nursing staff.      Objective: I have reviewed patient's vital signs, intake and output, medications and surgery notes.  General: alert and no distress Resp: clear to auscultation bilaterally Cardio: regular rate and rhythm GI: soft, non-tender; bowel sounds normal; no masses,  no organomegaly Extremities: extremities normal, atraumatic, no cyanosis or edema; SCDs in place Inc: C/D/I  Assessment: s/p Procedure(s): LAPAROSCOPY DIAGNOSTIC SUPRACERVICAL HYSTERECTOMY ABDOMINAL WITH LEFT OOPHORECTOMY AND BILATERAL SALPINGECTOMY (N/A) CYSTOSCOPY (N/A): stable and progressing well  Plan: Advance diet Encourage ambulation continue PCA  Will add gabapentin - help w pain and sleep   LOS: 0 days    [name] 04/06/2018, 5:56 PM

## 2018-04-06 NOTE — Anesthesia Postprocedure Evaluation (Signed)
Anesthesia Post Note  Patient: [name]  Procedure(s) Performed: LAPAROSCOPY DIAGNOSTIC (Abdomen) SUPRACERVICAL HYSTERECTOMY ABDOMINAL WITH LEFT OOPHORECTOMY AND BILATERAL SALPINGECTOMY (N/A Abdomen) CYSTOSCOPY (N/A Bladder)     Patient location during evaluation: [facility] Anesthesia Type: General Level of consciousness: awake and alert Pain management: satisfactory to patient Vital Signs Assessment: post-procedure vital signs reviewed and stable Respiratory status: spontaneous breathing and respiratory function stable Cardiovascular status: stable Postop Assessment: adequate PO intake Anesthetic complications: no    Last Vitals:  Vitals:   04/06/18 1839 04/06/18 1941  BP:  124/81  Pulse: (!) 105 91  Resp: 15 16  Temp:  36.9 C  SpO2:  97%    Last Pain:  Vitals:   04/06/18 1941  TempSrc: Oral  PainSc:    Pain Goal: Patients Stated Pain Goal: 3 (04/06/18 1518)               Meliza Kage

## 2018-04-06 NOTE — Anesthesia Preprocedure Evaluation (Addendum)
Anesthesia Evaluation  Patient identified by MRN, date of birth, ID band Patient awake    Reviewed: Allergy & Precautions, H&P , NPO status , Patient's Chart, lab work & pertinent test results  History of Anesthesia Complications Negative for: history of anesthetic complications  Airway Mallampati: II  TM Distance: >3 FB Neck ROM: Full    Dental  (+) Teeth Intact, Dental Advisory Given   Pulmonary sleep apnea and Continuous Positive Airway Pressure Ventilation , Current Smoker, former smoker,    Pulmonary exam normal        Cardiovascular Normal cardiovascular exam+ dysrhythmias   Hx of WPW s/p ablation in 1996   Neuro/Psych PSYCHIATRIC DISORDERS Anxiety Depression negative psych ROS   GI/Hepatic negative GI ROS, Neg liver ROS, GERD  Medicated,  Endo/Other  Hypothyroidism Obese, BMI 37  Renal/GU negative Renal ROS  negative genitourinary   Musculoskeletal  (+) Arthritis , Osteoarthritis,  Fibromyalgia -, narcotic dependent  Abdominal (+) + obese,   Peds negative pediatric ROS (+)  Hematology negative hematology ROS (+)   Anesthesia Other Findings   Reproductive/Obstetrics                            Anesthesia Physical  Anesthesia Plan  ASA: II  Anesthesia Plan: General   Post-op Pain Management:    Induction: Intravenous  PONV Risk Score and Plan: 4 or greater and Dexamethasone, Ondansetron, Scopolamine patch - Pre-op, Midazolam and Treatment may vary due to age or medical condition  Airway Management Planned: Oral ETT  Additional Equipment:   Intra-op Plan:   Post-operative Plan: Extubation in OR  Informed Consent: I have reviewed the patients History and Physical, chart, labs and discussed the procedure including the risks, benefits and alternatives for the proposed anesthesia with the patient or authorized representative who has indicated his/her understanding and  acceptance.   Dental advisory given  Plan Discussed with:   Anesthesia Plan Comments:        Anesthesia Quick Evaluation

## 2018-04-06 NOTE — Progress Notes (Signed)
Patient did not bring CPAP as instructed.  Husband Cristie Hem will go home and get CPAP after patient goes into surgery.

## 2018-04-06 NOTE — Addendum Note (Signed)
Addendum  created 04/06/18 2056 by Flossie Dibble, CRNA   Sign clinical note

## 2018-04-06 NOTE — Transfer of Care (Signed)
Immediate Anesthesia Transfer of Care Note  Patient: KATTALEYA ALIA  Procedure(s) Performed: LAPAROSCOPY DIAGNOSTIC (Abdomen) SUPRACERVICAL HYSTERECTOMY ABDOMINAL WITH LEFT OOPHORECTOMY AND BILATERAL SALPINGECTOMY (N/A Abdomen) CYSTOSCOPY (N/A Bladder)  Patient Location: PACU  Anesthesia Type:General  Level of Consciousness: sedated  Airway & Oxygen Therapy: Patient Spontanous Breathing and Patient connected to nasal cannula oxygen  Post-op Assessment: Report given to RN  Post vital signs: Reviewed and stable  Last Vitals:  Vitals Value Taken Time  BP    Temp    Pulse    Resp    SpO2      Last Pain:  Vitals:   04/06/18 0721  TempSrc: Oral  PainSc: 0-No pain      Patients Stated Pain Goal: 3 (91/44/45 8483)  Complications: No apparent anesthesia complications

## 2018-04-06 NOTE — Anesthesia Procedure Notes (Signed)
Procedure Name: Intubation Date/Time: 04/06/2018 8:31 AM Performed by: Asher Muir, CRNA Pre-anesthesia Checklist: Patient identified, Emergency Drugs available, Suction available and Patient being monitored Patient Re-evaluated:Patient Re-evaluated prior to induction Oxygen Delivery Method: Circle system utilized and Simple face mask Preoxygenation: Pre-oxygenation with 100% oxygen Induction Type: Combination inhalational/ intravenous induction Ventilation: Mask ventilation without difficulty Laryngoscope Size: Mac, 3 and Glidescope Grade View: Grade I Tube type: Oral Tube size: 7.0 mm Number of attempts: 1 Airway Equipment and Method: Stylet and Video-laryngoscopy Placement Confirmation: ETT inserted through vocal cords under direct vision,  positive ETCO2 and breath sounds checked- equal and bilateral Secured at: 21 (right lip) cm Tube secured with: Tape Dental Injury: Teeth and Oropharynx as per pre-operative assessment

## 2018-04-07 ENCOUNTER — Encounter (HOSPITAL_COMMUNITY): Payer: Self-pay | Admitting: Obstetrics and Gynecology

## 2018-04-07 LAB — CBC
HCT: 26.5 % — ABNORMAL LOW (ref 36.0–46.0)
Hemoglobin: 8.8 g/dL — ABNORMAL LOW (ref 12.0–15.0)
MCH: 28.8 pg (ref 26.0–34.0)
MCHC: 33.2 g/dL (ref 30.0–36.0)
MCV: 86.6 fL (ref 80.0–100.0)
NRBC: 0 % (ref 0.0–0.2)
PLATELETS: 222 10*3/uL (ref 150–400)
RBC: 3.06 MIL/uL — AB (ref 3.87–5.11)
RDW: 14 % (ref 11.5–15.5)
WBC: 13.8 10*3/uL — ABNORMAL HIGH (ref 4.0–10.5)

## 2018-04-07 LAB — BASIC METABOLIC PANEL
Anion gap: 6 (ref 5–15)
BUN: 8 mg/dL (ref 6–20)
CO2: 27 mmol/L (ref 22–32)
Calcium: 7.9 mg/dL — ABNORMAL LOW (ref 8.9–10.3)
Chloride: 102 mmol/L (ref 98–111)
Creatinine, Ser: 0.83 mg/dL (ref 0.44–1.00)
GFR calc Af Amer: >60 mL/min (ref >60)
Glucose, Bld: 113 mg/dL — ABNORMAL HIGH (ref 70–99)
POTASSIUM: 3.8 mmol/L (ref 3.5–5.1)
SODIUM: 135 mmol/L (ref 135–145)

## 2018-04-07 MED ORDER — IBUPROFEN 600 MG PO TABS: 600.0000 mg | ORAL_TABLET | Freq: Four times a day (QID) | ORAL | 0 refills | Status: DC | PRN

## 2018-04-07 MED ORDER — OXYCODONE HCL 10 MG PO TABS: 5.0000 mg | ORAL_TABLET | ORAL | 0 refills | Status: DC | PRN

## 2018-04-07 NOTE — Progress Notes (Signed)
Pt teaching complete   Out with husband in wheelchair

## 2018-04-07 NOTE — Discharge Summary (Signed)
Physician Discharge Summary  Patient ID: [name] MRN: [id number] DOB/AGE: 11/16/1970 47 y.o.  Admit date: 04/06/2018 Discharge date: 04/07/2018  Admission Diagnoses: Pelvic pain Rapidly enlarging fibroid uterus  Discharge Diagnoses:  Same with pelvic adhesions Active Problems:   S/P TAH (total abdominal hysterectomy) Diagnostic laparoscopy/Supracervical TAH/BS and left oophorectomy Cystoscopy  Discharged Condition: good  Hospital Course: Pt admitted for routine postoperative care for a supracervical TAH/BS and left oophorectomy.  She had a greater than average blood loss due to poor visibility from adhesions and the bulk of the uterus.  She tolerated the relative anemia well with a drop in hgb from 14 to 8.8, ambulated without dizziness and had great UOP.  On postop day 1 she requested d/c home.  She was ambulating, tolerating po meds and and regular diet and voiding without difficulty.    Significant Diagnostic Studies:  none  Treatments: surgery: supracervical hysterectomy/BS/left oophorectectomy  Discharge Exam: Blood pressure (!) 108/58, pulse 84, temperature 98.3 F (36.8 C), temperature source Oral, resp. rate 18, SpO2 99 %. General appearance: alert and cooperative GI: soft, NT Incision/Wound: C/D/I  Disposition: Discharge disposition: 01-Home or Self Care       Discharge Instructions    Call MD for:  persistant nausea and vomiting   Complete by:  As directed    Call MD for:  redness, tenderness, or signs of infection (pain, swelling, redness, odor or green/yellow discharge around incision site)   Complete by:  As directed    Call MD for:  severe uncontrolled pain   Complete by:  As directed    Call MD for:  temperature >100.4   Complete by:  As directed    Diet - low sodium heart healthy   Complete by:  As directed    Discharge instructions   Complete by:  As directed    Avoid driving for at least 1-2 weeks or until off narcotic pain meds.  No  heavy lifting greater than 10 lbs.  Nothing in vagina for 6 weeks.  May remove bandage in 1-2 days.  Shower over incision and pat dry.     Allergies as of 04/07/2018      Reactions   Dilaudid [hydromorphone Hcl] Palpitations   Heart palpitations    Phentermine Palpitations   TACHYCARDIA   Shellfish Allergy Hives   SHRIMP    Lactose Intolerance (gi) Diarrhea   GI Distress      Medication List    STOP taking these medications   predniSONE 5 MG (21) Tbpk tablet Commonly known as:  STERAPRED UNI-PAK 21 TAB     TAKE these medications   amphetamine-dextroamphetamine 20 MG tablet Commonly known as:  ADDERALL Take 20 mg by mouth 3 (three) times daily.   b complex vitamins tablet Take 1 tablet by mouth daily.   BELSOMRA 20 MG Tabs Generic drug:  Suvorexant Take 20 mg by mouth at bedtime.   BIJUVA 1-100 MG Caps Generic drug:  Estradiol-Progesterone Take 1 tablet by mouth daily.   cetirizine 10 MG tablet Commonly known as:  ZYRTEC Take 10 mg by mouth daily as needed for allergies.   clonazePAM 2 MG tablet Commonly known as:  KLONOPIN Take 2 mg by mouth 3 (three) times daily as needed for anxiety.   doxepin 50 MG capsule Commonly known as:  SINEQUAN Take 100 mg by mouth at bedtime.   DULoxetine 60 MG capsule Commonly known as:  CYMBALTA Take 60 mg by mouth daily.   famotidine 20 MG tablet  Commonly known as:  PEPCID Take 20 mg by mouth daily.   fluticasone 50 MCG/ACT nasal spray Commonly known as:  FLONASE Place 2 sprays into both nostrils at bedtime.   gabapentin 600 MG tablet Commonly known as:  NEURONTIN Take 2,400 mg by mouth at bedtime.   ibuprofen 600 MG tablet Commonly known as:  ADVIL,MOTRIN Take 1 tablet (600 mg total) by mouth every 6 (six) hours as needed (mild pain).   KLOR-CON M20 20 MEQ tablet Generic drug:  potassium chloride SA Take 20 mEq by mouth 2 (two) times daily.   levothyroxine 175 MCG tablet Commonly known as:  SYNTHROID,  LEVOTHROID Take 175 mcg by mouth daily before breakfast.   lidocaine 5 % Commonly known as:  LIDODERM Place 3 patches onto the skin daily as needed (pain).   lubiprostone 8 MCG capsule Commonly known as:  AMITIZA Take 1 capsule (8 mcg total) by mouth 2 (two) times daily with a meal.   MAGNESIUM PO Take 500 mg by mouth daily.   ondansetron 4 MG disintegrating tablet Commonly known as:  ZOFRAN-ODT Take 4 mg by mouth every 8 (eight) hours as needed for nausea or vomiting.   Oxycodone HCl 10 MG Tabs Take 0.5 tablets (5 mg total) by mouth every 4 (four) hours as needed for moderate pain. What changed:    medication strength  how much to take  reasons to take this   REXULTI 2 MG Tabs Generic drug:  Brexpiprazole Take 2 mg by mouth daily.   tamsulosin 0.4 MG Caps capsule Commonly known as:  FLOMAX Take 0.4 mg by mouth daily.   tizanidine 6 MG capsule Commonly known as:  ZANAFLEX Take 1 capsule (6 mg total) by mouth 3 (three) times daily as needed for muscle spasms.   topiramate 25 MG tablet Commonly known as:  TOPAMAX Take 25 mg by mouth daily.   torsemide 20 MG tablet Commonly known as:  DEMADEX Take 20-40 mg by mouth daily as needed (edema).   TRINTELLIX 10 MG Tabs tablet Generic drug:  vortioxetine HBr Take 10 mg by mouth daily.   valACYclovir 1000 MG tablet Commonly known as:  VALTREX Take 2,000 mg by mouth 2 (two) times daily as needed (outbreaks).   VIIBRYD 40 MG Tabs Generic drug:  Vilazodone HCl Take 20 mg by mouth daily.   Vitamin D (Ergocalciferol) 50000 units Caps capsule Commonly known as:  DRISDOL Take 50,000 Units by mouth every Thursday.      Follow-up Information    Paula Compton, MD. Schedule an appointment as soon as possible for a visit in 2 week(s).   Specialty:  Obstetrics and Gynecology Why:  Incision check Contact information: [address] [place] [address] [phone]           Signed: Logan Bores 04/07/2018, 10:09 AM

## 2018-04-07 NOTE — Progress Notes (Signed)
pts MORPHINE PCA d/c  8 ml  Wasted in sink

## 2018-04-07 NOTE — Progress Notes (Signed)
1 Day Post-Op Procedure(s) (LRB): LAPAROSCOPY DIAGNOSTIC SUPRACERVICAL HYSTERECTOMY ABDOMINAL WITH LEFT OOPHORECTOMY AND BILATERAL SALPINGECTOMY (N/A) CYSTOSCOPY (N/A)  Subjective: Patient reports tolerating PO.  She is ambulating fine with no dizziness from her relative anemia.  Foley just out, and has not voided yet.  No flatus but feels rumbling.  Main complaint is back pain from her chronic issues.  She is begging to go home to be in her own bed  Objective: I have reviewed patient's vital signs, intake and output and labs.  General: alert and cooperative GI: incision: clean, dry and intact and soft NT with +BS Vaginal Bleeding: minimal  Assessment: s/p Procedure(s): LAPAROSCOPY DIAGNOSTIC SUPRACERVICAL HYSTERECTOMY ABDOMINAL WITH LEFT OOPHORECTOMY AND BILATERAL SALPINGECTOMY (N/A) CYSTOSCOPY (N/A): progressing well  Plan: Advance to PO medication Discharge home later this PM if voidiing without problem She has some oxycontin at home for her back, but it is 15mg  so I am going to send her some 5mg  tabs in to take a lower dose. Continue Bijuvia  LOS: 1 day    Logan Bores 04/07/2018, 8:38 AM

## 2018-04-09 LAB — TYPE AND SCREEN
ABO/RH(D): O NEG
Antibody Screen: NEGATIVE
UNIT DIVISION: 0
UNIT DIVISION: 0
Unit division: 0
Unit division: 0

## 2018-04-09 LAB — BPAM RBC
BLOOD PRODUCT EXPIRATION DATE: 201910302359
BLOOD PRODUCT EXPIRATION DATE: 201911052359
Blood Product Expiration Date: 201910202359
Blood Product Expiration Date: 201911012359
UNIT TYPE AND RH: 9500
UNIT TYPE AND RH: 9500
Unit Type and Rh: 9500
Unit Type and Rh: 9500

## 2018-04-14 DIAGNOSIS — G4733 Obstructive sleep apnea (adult) (pediatric): Secondary | ICD-10-CM | POA: Diagnosis not present

## 2018-04-14 DIAGNOSIS — Z09 Encounter for follow-up examination after completed treatment for conditions other than malignant neoplasm: Secondary | ICD-10-CM | POA: Diagnosis not present

## 2018-04-21 DIAGNOSIS — Z09 Encounter for follow-up examination after completed treatment for conditions other than malignant neoplasm: Secondary | ICD-10-CM | POA: Diagnosis not present

## 2018-05-05 ENCOUNTER — Ambulatory Visit: Payer: BLUE CROSS/BLUE SHIELD | Admitting: Podiatry

## 2018-05-10 DIAGNOSIS — D649 Anemia, unspecified: Secondary | ICD-10-CM | POA: Diagnosis not present

## 2018-05-30 DIAGNOSIS — F3342 Major depressive disorder, recurrent, in full remission: Secondary | ICD-10-CM | POA: Diagnosis not present

## 2018-06-02 DIAGNOSIS — Z01419 Encounter for gynecological examination (general) (routine) without abnormal findings: Secondary | ICD-10-CM | POA: Diagnosis not present

## 2018-06-02 DIAGNOSIS — Z130 Encounter for screening for diseases of the blood and blood-forming organs and certain disorders involving the immune mechanism: Secondary | ICD-10-CM | POA: Diagnosis not present

## 2018-06-02 DIAGNOSIS — Z124 Encounter for screening for malignant neoplasm of cervix: Secondary | ICD-10-CM | POA: Diagnosis not present

## 2018-06-02 DIAGNOSIS — Z1231 Encounter for screening mammogram for malignant neoplasm of breast: Secondary | ICD-10-CM | POA: Diagnosis not present

## 2018-06-02 DIAGNOSIS — Z6835 Body mass index (BMI) 35.0-35.9, adult: Secondary | ICD-10-CM | POA: Diagnosis not present

## 2018-06-04 DIAGNOSIS — M79601 Pain in right arm: Secondary | ICD-10-CM | POA: Diagnosis not present

## 2018-06-07 ENCOUNTER — Other Ambulatory Visit: Payer: Self-pay | Admitting: Obstetrics and Gynecology

## 2018-06-07 DIAGNOSIS — R921 Mammographic calcification found on diagnostic imaging of breast: Secondary | ICD-10-CM

## 2018-06-10 ENCOUNTER — Other Ambulatory Visit: Payer: Self-pay | Admitting: Obstetrics and Gynecology

## 2018-06-10 ENCOUNTER — Ambulatory Visit
Admission: RE | Admit: 2018-06-10 | Discharge: 2018-06-10 | Disposition: A | Discharge status: Home / Self Care | Payer: BLUE CROSS/BLUE SHIELD | Source: Ambulatory Visit | Attending: Obstetrics and Gynecology | Admitting: Obstetrics and Gynecology

## 2018-06-10 DIAGNOSIS — R921 Mammographic calcification found on diagnostic imaging of breast: Secondary | ICD-10-CM | POA: Diagnosis not present

## 2018-06-13 ENCOUNTER — Other Ambulatory Visit: Payer: Self-pay | Admitting: Obstetrics and Gynecology

## 2018-06-13 ENCOUNTER — Ambulatory Visit
Admission: RE | Admit: 2018-06-13 | Discharge: 2018-06-13 | Disposition: A | Discharge status: Home / Self Care | Payer: BLUE CROSS/BLUE SHIELD | Source: Ambulatory Visit | Attending: Obstetrics and Gynecology | Admitting: Obstetrics and Gynecology

## 2018-06-13 DIAGNOSIS — R921 Mammographic calcification found on diagnostic imaging of breast: Secondary | ICD-10-CM

## 2018-06-13 DIAGNOSIS — N6011 Diffuse cystic mastopathy of right breast: Secondary | ICD-10-CM | POA: Diagnosis not present

## 2018-07-01 DIAGNOSIS — R42 Dizziness and giddiness: Secondary | ICD-10-CM | POA: Diagnosis not present

## 2018-07-01 DIAGNOSIS — R0781 Pleurodynia: Secondary | ICD-10-CM | POA: Diagnosis not present

## 2018-07-04 DIAGNOSIS — M79601 Pain in right arm: Secondary | ICD-10-CM | POA: Diagnosis not present

## 2018-07-28 DIAGNOSIS — K219 Gastro-esophageal reflux disease without esophagitis: Secondary | ICD-10-CM | POA: Diagnosis not present

## 2018-07-28 DIAGNOSIS — R109 Unspecified abdominal pain: Secondary | ICD-10-CM | POA: Diagnosis not present

## 2018-08-04 DIAGNOSIS — Z0000 Encounter for general adult medical examination without abnormal findings: Secondary | ICD-10-CM | POA: Diagnosis not present

## 2018-08-08 DIAGNOSIS — Z0000 Encounter for general adult medical examination without abnormal findings: Secondary | ICD-10-CM | POA: Diagnosis not present

## 2018-08-08 DIAGNOSIS — E559 Vitamin D deficiency, unspecified: Secondary | ICD-10-CM | POA: Diagnosis not present

## 2018-08-08 DIAGNOSIS — E7800 Pure hypercholesterolemia, unspecified: Secondary | ICD-10-CM | POA: Diagnosis not present

## 2018-08-08 DIAGNOSIS — E039 Hypothyroidism, unspecified: Secondary | ICD-10-CM | POA: Diagnosis not present

## 2018-08-08 DIAGNOSIS — D649 Anemia, unspecified: Secondary | ICD-10-CM | POA: Diagnosis not present

## 2018-08-11 DIAGNOSIS — M7711 Lateral epicondylitis, right elbow: Secondary | ICD-10-CM | POA: Diagnosis not present

## 2018-08-16 ENCOUNTER — Other Ambulatory Visit: Payer: Self-pay | Admitting: Orthopedic Surgery

## 2018-08-16 DIAGNOSIS — M7711 Lateral epicondylitis, right elbow: Secondary | ICD-10-CM

## 2018-08-23 DIAGNOSIS — M25562 Pain in left knee: Secondary | ICD-10-CM | POA: Diagnosis not present

## 2018-08-23 DIAGNOSIS — S83282A Other tear of lateral meniscus, current injury, left knee, initial encounter: Secondary | ICD-10-CM | POA: Diagnosis not present

## 2018-08-24 ENCOUNTER — Ambulatory Visit
Admission: RE | Admit: 2018-08-24 | Discharge: 2018-08-24 | Disposition: A | Discharge status: Home / Self Care | Payer: BLUE CROSS/BLUE SHIELD | Source: Ambulatory Visit | Attending: Orthopedic Surgery | Admitting: Orthopedic Surgery

## 2018-08-24 DIAGNOSIS — M25521 Pain in right elbow: Secondary | ICD-10-CM | POA: Diagnosis not present

## 2018-08-24 DIAGNOSIS — M7989 Other specified soft tissue disorders: Secondary | ICD-10-CM | POA: Diagnosis not present

## 2018-08-24 DIAGNOSIS — M7711 Lateral epicondylitis, right elbow: Secondary | ICD-10-CM

## 2018-08-25 DIAGNOSIS — E7849 Other hyperlipidemia: Secondary | ICD-10-CM | POA: Diagnosis not present

## 2018-08-25 DIAGNOSIS — I456 Pre-excitation syndrome: Secondary | ICD-10-CM | POA: Diagnosis not present

## 2018-08-25 DIAGNOSIS — Z79899 Other long term (current) drug therapy: Secondary | ICD-10-CM | POA: Diagnosis not present

## 2018-08-25 DIAGNOSIS — I493 Ventricular premature depolarization: Secondary | ICD-10-CM | POA: Diagnosis not present

## 2018-08-26 DIAGNOSIS — I456 Pre-excitation syndrome: Secondary | ICD-10-CM | POA: Diagnosis not present

## 2018-08-26 DIAGNOSIS — I493 Ventricular premature depolarization: Secondary | ICD-10-CM | POA: Diagnosis not present

## 2018-08-30 DIAGNOSIS — M7711 Lateral epicondylitis, right elbow: Secondary | ICD-10-CM | POA: Diagnosis not present

## 2018-09-06 DIAGNOSIS — F331 Major depressive disorder, recurrent, moderate: Secondary | ICD-10-CM | POA: Diagnosis not present

## 2018-09-09 ENCOUNTER — Other Ambulatory Visit: Payer: Self-pay | Admitting: Podiatry

## 2018-09-09 ENCOUNTER — Ambulatory Visit (INDEPENDENT_AMBULATORY_CARE_PROVIDER_SITE_OTHER): Payer: BLUE CROSS/BLUE SHIELD

## 2018-09-09 ENCOUNTER — Ambulatory Visit (INDEPENDENT_AMBULATORY_CARE_PROVIDER_SITE_OTHER): Payer: BLUE CROSS/BLUE SHIELD | Admitting: Podiatry

## 2018-09-09 ENCOUNTER — Encounter: Payer: Self-pay | Admitting: Podiatry

## 2018-09-09 ENCOUNTER — Other Ambulatory Visit: Payer: Self-pay

## 2018-09-09 DIAGNOSIS — M779 Enthesopathy, unspecified: Secondary | ICD-10-CM | POA: Diagnosis not present

## 2018-09-09 DIAGNOSIS — M79672 Pain in left foot: Principal | ICD-10-CM

## 2018-09-09 DIAGNOSIS — R600 Localized edema: Secondary | ICD-10-CM

## 2018-09-09 DIAGNOSIS — R609 Edema, unspecified: Secondary | ICD-10-CM | POA: Diagnosis not present

## 2018-09-09 DIAGNOSIS — M79671 Pain in right foot: Secondary | ICD-10-CM

## 2018-09-09 DIAGNOSIS — R0609 Other forms of dyspnea: Secondary | ICD-10-CM | POA: Diagnosis not present

## 2018-09-09 MED ORDER — TRIAMCINOLONE ACETONIDE 10 MG/ML IJ SUSP
10.0000 mg | Freq: Once | INTRAMUSCULAR | Status: AC
  Administered 2018-09-09: 10 mg

## 2018-09-12 NOTE — Progress Notes (Signed)
Subjective:   Patient ID: [name], female   DOB: 48 y.o.   MRN: [id number]   HPI Patient presents stating a lot of pain left over right foot that is been going on for the last couple days and patient states that it is been very sore and making it hard for her to be active and does not remember specific injury but did start new medication   ROS      Objective:  Physical Exam  Neurovascular status intact with +1 pitting edema around the left ankle with negative Homans sign noted with discomfort within the sinus tarsi and mild swelling to the right with negative Homans sign noted.     Assessment:  Probability for a reaction to medication she may be taking versus a localized inflammatory process with no systemic indications of problems     Plan:  H&P reviewed condition questioned her systemically did not see indications of pathology and today did a careful injection sinus tarsi left 3 mg Kenalog 5 g Xylocaine applied Unna boot to reduce swelling I applied an ankle compression stocking for the right and I advised him going to the emergency room if any further swelling or pathology from a systemic standpoint were to occur.  Patient will be checked back 1 week or earlier if needed  X-rays were negative for signs of fracture or bone pathology associated with condition

## 2018-09-13 DIAGNOSIS — M7711 Lateral epicondylitis, right elbow: Secondary | ICD-10-CM | POA: Diagnosis not present

## 2018-09-13 DIAGNOSIS — M79601 Pain in right arm: Secondary | ICD-10-CM | POA: Diagnosis not present

## 2018-09-15 ENCOUNTER — Ambulatory Visit: Payer: BLUE CROSS/BLUE SHIELD | Admitting: Podiatry

## 2018-09-20 DIAGNOSIS — M546 Pain in thoracic spine: Secondary | ICD-10-CM | POA: Diagnosis not present

## 2018-10-11 DIAGNOSIS — I493 Ventricular premature depolarization: Secondary | ICD-10-CM | POA: Diagnosis not present

## 2018-10-11 DIAGNOSIS — R0609 Other forms of dyspnea: Secondary | ICD-10-CM | POA: Diagnosis not present

## 2018-10-11 DIAGNOSIS — R002 Palpitations: Secondary | ICD-10-CM | POA: Diagnosis not present

## 2018-10-11 DIAGNOSIS — R000 Tachycardia, unspecified: Secondary | ICD-10-CM | POA: Diagnosis not present

## 2018-10-12 DIAGNOSIS — R1030 Lower abdominal pain, unspecified: Secondary | ICD-10-CM | POA: Diagnosis not present

## 2018-10-12 DIAGNOSIS — D508 Other iron deficiency anemias: Secondary | ICD-10-CM | POA: Diagnosis not present

## 2018-10-17 ENCOUNTER — Other Ambulatory Visit: Payer: Self-pay | Admitting: Neurological Surgery

## 2018-10-17 DIAGNOSIS — M546 Pain in thoracic spine: Secondary | ICD-10-CM

## 2018-10-17 DIAGNOSIS — M5416 Radiculopathy, lumbar region: Secondary | ICD-10-CM

## 2018-10-18 DIAGNOSIS — R1084 Generalized abdominal pain: Secondary | ICD-10-CM | POA: Diagnosis not present

## 2018-10-18 DIAGNOSIS — R197 Diarrhea, unspecified: Secondary | ICD-10-CM | POA: Diagnosis not present

## 2018-10-20 ENCOUNTER — Ambulatory Visit: Payer: BLUE CROSS/BLUE SHIELD | Admitting: Podiatry

## 2018-10-20 DIAGNOSIS — G4733 Obstructive sleep apnea (adult) (pediatric): Secondary | ICD-10-CM | POA: Diagnosis not present

## 2018-10-20 DIAGNOSIS — R143 Flatulence: Secondary | ICD-10-CM | POA: Diagnosis not present

## 2018-10-20 DIAGNOSIS — R197 Diarrhea, unspecified: Secondary | ICD-10-CM | POA: Diagnosis not present

## 2018-10-21 ENCOUNTER — Ambulatory Visit: Payer: BLUE CROSS/BLUE SHIELD | Admitting: Podiatry

## 2018-10-21 DIAGNOSIS — R197 Diarrhea, unspecified: Secondary | ICD-10-CM | POA: Diagnosis not present

## 2018-10-25 DIAGNOSIS — R61 Generalized hyperhidrosis: Secondary | ICD-10-CM | POA: Diagnosis not present

## 2018-10-25 DIAGNOSIS — N644 Mastodynia: Secondary | ICD-10-CM | POA: Diagnosis not present

## 2018-10-25 DIAGNOSIS — R591 Generalized enlarged lymph nodes: Secondary | ICD-10-CM | POA: Diagnosis not present

## 2018-10-25 DIAGNOSIS — R635 Abnormal weight gain: Secondary | ICD-10-CM | POA: Diagnosis not present

## 2018-10-28 DIAGNOSIS — F900 Attention-deficit hyperactivity disorder, predominantly inattentive type: Secondary | ICD-10-CM | POA: Diagnosis not present

## 2018-10-28 DIAGNOSIS — F3342 Major depressive disorder, recurrent, in full remission: Secondary | ICD-10-CM | POA: Diagnosis not present

## 2018-10-31 ENCOUNTER — Ambulatory Visit: Payer: BLUE CROSS/BLUE SHIELD

## 2018-10-31 ENCOUNTER — Encounter: Payer: BLUE CROSS/BLUE SHIELD | Admitting: Podiatry

## 2018-10-31 NOTE — Progress Notes (Signed)
This encounter was created in error - please disregard.

## 2018-11-02 ENCOUNTER — Encounter: Payer: Self-pay | Admitting: Podiatry

## 2018-11-02 ENCOUNTER — Ambulatory Visit (INDEPENDENT_AMBULATORY_CARE_PROVIDER_SITE_OTHER): Payer: BLUE CROSS/BLUE SHIELD | Admitting: Podiatry

## 2018-11-02 ENCOUNTER — Other Ambulatory Visit: Payer: Self-pay

## 2018-11-02 VITALS — Temp 97.3°F

## 2018-11-02 DIAGNOSIS — M779 Enthesopathy, unspecified: Secondary | ICD-10-CM

## 2018-11-02 DIAGNOSIS — M778 Other enthesopathies, not elsewhere classified: Secondary | ICD-10-CM

## 2018-11-02 DIAGNOSIS — R600 Localized edema: Secondary | ICD-10-CM

## 2018-11-03 ENCOUNTER — Ambulatory Visit: Payer: BLUE CROSS/BLUE SHIELD | Admitting: Podiatry

## 2018-11-03 DIAGNOSIS — M542 Cervicalgia: Secondary | ICD-10-CM | POA: Diagnosis not present

## 2018-11-03 DIAGNOSIS — R591 Generalized enlarged lymph nodes: Secondary | ICD-10-CM | POA: Diagnosis not present

## 2018-11-03 DIAGNOSIS — J343 Hypertrophy of nasal turbinates: Secondary | ICD-10-CM | POA: Diagnosis not present

## 2018-11-03 DIAGNOSIS — F17200 Nicotine dependence, unspecified, uncomplicated: Secondary | ICD-10-CM | POA: Diagnosis not present

## 2018-11-03 NOTE — Progress Notes (Signed)
Subjective:   Patient ID: [name], female   DOB: 48 y.o.   MRN: [id number]   HPI Patient presents concerned about the swelling in her left foot with multiple questions concerning it and states that she wants to know who she may see for this condition with moderate discomfort   ROS      Objective:  Physical Exam  Neurovascular status intact with patient's left ankle moderately edematous with negative Homans sign noted with mild to moderate discomfort with palpation     Assessment:  Combination of left lower leg ankle swelling which is most likely vein oriented along with capsulitis     Plan:  H&P discussed both conditions and today I did a sterile prep and injected directly into the sinus tarsi left 3 mg Kenalog 5 mg Xylocaine and advised on physical therapy anti-inflammatories compression and I am sending to the vein center for evaluation

## 2018-11-10 DIAGNOSIS — F4321 Adjustment disorder with depressed mood: Secondary | ICD-10-CM | POA: Diagnosis not present

## 2018-11-15 ENCOUNTER — Other Ambulatory Visit: Payer: Self-pay | Admitting: Orthopedic Surgery

## 2018-11-15 DIAGNOSIS — M25562 Pain in left knee: Secondary | ICD-10-CM

## 2018-11-16 DIAGNOSIS — R7309 Other abnormal glucose: Secondary | ICD-10-CM | POA: Diagnosis not present

## 2018-11-16 DIAGNOSIS — E7800 Pure hypercholesterolemia, unspecified: Secondary | ICD-10-CM | POA: Diagnosis not present

## 2018-11-23 DIAGNOSIS — M5440 Lumbago with sciatica, unspecified side: Secondary | ICD-10-CM | POA: Diagnosis not present

## 2018-11-23 DIAGNOSIS — R609 Edema, unspecified: Secondary | ICD-10-CM | POA: Diagnosis not present

## 2018-11-26 ENCOUNTER — Other Ambulatory Visit: Payer: BLUE CROSS/BLUE SHIELD

## 2018-11-28 DIAGNOSIS — R0600 Dyspnea, unspecified: Secondary | ICD-10-CM | POA: Diagnosis not present

## 2018-11-30 ENCOUNTER — Ambulatory Visit
Admission: RE | Admit: 2018-11-30 | Discharge: 2018-11-30 | Disposition: A | Discharge status: Home / Self Care | Payer: BC Managed Care – PPO | Source: Ambulatory Visit | Attending: Neurological Surgery | Admitting: Neurological Surgery

## 2018-11-30 ENCOUNTER — Other Ambulatory Visit: Payer: Self-pay

## 2018-11-30 DIAGNOSIS — M5126 Other intervertebral disc displacement, lumbar region: Secondary | ICD-10-CM | POA: Diagnosis not present

## 2018-11-30 DIAGNOSIS — M5416 Radiculopathy, lumbar region: Secondary | ICD-10-CM

## 2018-11-30 DIAGNOSIS — M4304 Spondylolysis, thoracic region: Secondary | ICD-10-CM | POA: Diagnosis not present

## 2018-11-30 DIAGNOSIS — M546 Pain in thoracic spine: Secondary | ICD-10-CM

## 2018-12-01 DIAGNOSIS — M5416 Radiculopathy, lumbar region: Secondary | ICD-10-CM | POA: Diagnosis not present

## 2018-12-01 DIAGNOSIS — Z6839 Body mass index (BMI) 39.0-39.9, adult: Secondary | ICD-10-CM | POA: Diagnosis not present

## 2018-12-01 DIAGNOSIS — R030 Elevated blood-pressure reading, without diagnosis of hypertension: Secondary | ICD-10-CM | POA: Diagnosis not present

## 2018-12-12 ENCOUNTER — Encounter: Payer: Self-pay | Admitting: Podiatry

## 2018-12-12 ENCOUNTER — Ambulatory Visit (INDEPENDENT_AMBULATORY_CARE_PROVIDER_SITE_OTHER): Payer: BC Managed Care – PPO

## 2018-12-12 ENCOUNTER — Ambulatory Visit (INDEPENDENT_AMBULATORY_CARE_PROVIDER_SITE_OTHER): Payer: BC Managed Care – PPO | Admitting: Podiatry

## 2018-12-12 ENCOUNTER — Other Ambulatory Visit: Payer: Self-pay

## 2018-12-12 VITALS — Temp 97.1°F

## 2018-12-12 DIAGNOSIS — S9002XA Contusion of left ankle, initial encounter: Secondary | ICD-10-CM | POA: Diagnosis not present

## 2018-12-12 DIAGNOSIS — B009 Herpesviral infection, unspecified: Secondary | ICD-10-CM | POA: Insufficient documentation

## 2018-12-12 DIAGNOSIS — N979 Female infertility, unspecified: Secondary | ICD-10-CM | POA: Insufficient documentation

## 2018-12-12 DIAGNOSIS — R600 Localized edema: Secondary | ICD-10-CM | POA: Diagnosis not present

## 2018-12-12 DIAGNOSIS — M519 Unspecified thoracic, thoracolumbar and lumbosacral intervertebral disc disorder: Secondary | ICD-10-CM | POA: Insufficient documentation

## 2018-12-12 DIAGNOSIS — J309 Allergic rhinitis, unspecified: Secondary | ICD-10-CM | POA: Insufficient documentation

## 2018-12-12 DIAGNOSIS — M1909 Primary osteoarthritis, other specified site: Secondary | ICD-10-CM | POA: Insufficient documentation

## 2018-12-12 DIAGNOSIS — G4700 Insomnia, unspecified: Secondary | ICD-10-CM | POA: Insufficient documentation

## 2018-12-12 DIAGNOSIS — M461 Sacroiliitis, not elsewhere classified: Secondary | ICD-10-CM | POA: Insufficient documentation

## 2018-12-12 DIAGNOSIS — M779 Enthesopathy, unspecified: Secondary | ICD-10-CM | POA: Diagnosis not present

## 2018-12-12 DIAGNOSIS — E890 Postprocedural hypothyroidism: Secondary | ICD-10-CM | POA: Insufficient documentation

## 2018-12-12 DIAGNOSIS — M064 Inflammatory polyarthropathy: Secondary | ICD-10-CM | POA: Insufficient documentation

## 2018-12-12 DIAGNOSIS — M25549 Pain in joints of unspecified hand: Secondary | ICD-10-CM | POA: Insufficient documentation

## 2018-12-12 DIAGNOSIS — M542 Cervicalgia: Secondary | ICD-10-CM | POA: Insufficient documentation

## 2018-12-12 DIAGNOSIS — N63 Unspecified lump in unspecified breast: Secondary | ICD-10-CM | POA: Insufficient documentation

## 2018-12-12 DIAGNOSIS — R252 Cramp and spasm: Secondary | ICD-10-CM | POA: Insufficient documentation

## 2018-12-12 DIAGNOSIS — M5430 Sciatica, unspecified side: Secondary | ICD-10-CM | POA: Insufficient documentation

## 2018-12-12 DIAGNOSIS — E669 Obesity, unspecified: Secondary | ICD-10-CM | POA: Insufficient documentation

## 2018-12-12 DIAGNOSIS — E559 Vitamin D deficiency, unspecified: Secondary | ICD-10-CM | POA: Insufficient documentation

## 2018-12-13 NOTE — Progress Notes (Signed)
Subjective:   Patient ID: [name], female   DOB: 48 y.o.   MRN: [id number]   HPI Patient states she fell a few weeks ago and twisted her left ankle and its been feeling unstable.  States it hurts quite a bit in the back and then the side and she does have swelling and is not yet seen her vein doctor   ROS      Objective:  Physical Exam  Neurovascular status was found to be intact negative Homans sign noted and I did check the Achilles tendon found there to be quite a bit of discomfort at the muscle tendon junction but no indication of tear.  There is edema around the ankle and inversion eversion also hurts and there is mild instability but no indications of ligament tearing.     Assessment:  Significant trauma to the left ankle and Achilles tendon with edema present and negative Homans sign noted     Plan:  H&P reviewed condition and at this point did discuss immobilization with boot that she has at home and I did apply Tri-Lock ankle brace to reduce stress against the ankle.  Patient will be seen back and I reviewed different x-ray versions that were done today  X-rays indicate that there is no signs of fracture of the ankle or foot or diastases injury present

## 2018-12-14 ENCOUNTER — Other Ambulatory Visit: Payer: BC Managed Care – PPO

## 2018-12-15 DIAGNOSIS — M5416 Radiculopathy, lumbar region: Secondary | ICD-10-CM | POA: Diagnosis not present

## 2019-01-02 ENCOUNTER — Other Ambulatory Visit: Payer: BC Managed Care – PPO

## 2019-01-06 ENCOUNTER — Ambulatory Visit: Payer: BC Managed Care – PPO | Admitting: Podiatry

## 2019-01-06 ENCOUNTER — Other Ambulatory Visit: Payer: Self-pay | Admitting: Podiatry

## 2019-01-06 ENCOUNTER — Other Ambulatory Visit: Payer: Self-pay

## 2019-01-06 ENCOUNTER — Ambulatory Visit (INDEPENDENT_AMBULATORY_CARE_PROVIDER_SITE_OTHER): Payer: BC Managed Care – PPO

## 2019-01-06 VITALS — Temp 96.8°F

## 2019-01-06 DIAGNOSIS — M722 Plantar fascial fibromatosis: Secondary | ICD-10-CM

## 2019-01-06 DIAGNOSIS — M79672 Pain in left foot: Secondary | ICD-10-CM

## 2019-01-06 NOTE — Patient Instructions (Signed)

## 2019-01-11 DIAGNOSIS — R600 Localized edema: Secondary | ICD-10-CM | POA: Diagnosis not present

## 2019-01-12 NOTE — Progress Notes (Signed)
Subjective:   Patient ID: [name], female   DOB: 48 y.o.   MRN: [id number]   HPI Patient presents with severe heel and arch pain left and does not remember specific injury.  States that it has been getting worse over time and making it hard for her to be active.   ROS      Objective:  Physical Exam  Neurovascular status intact with patient found to have inflammation of the plantar heel and into the arch left with fluid buildup and is noted to have mild swelling of the foot but not severe.  No pitting edema noted     Assessment:  Plantar fasciitis with mid arch fascial inflammation left     Plan:  H&P condition reviewed and at this point I did go ahead and I injected the fascia 3 mg Kenalog 5 g Xylocaine and applied sterile dressing.  I then dispensed a fascial brace and a night splint to use while sleeping and instructed on reduced activity and stretching exercises will be seen back in 3 weeks  X-ray was negative for signs of fracture with minimal spur formation or other pathology associated with condition

## 2019-01-16 ENCOUNTER — Ambulatory Visit
Admission: RE | Admit: 2019-01-16 | Discharge: 2019-01-16 | Disposition: A | Discharge status: Home / Self Care | Payer: BC Managed Care – PPO | Source: Ambulatory Visit | Attending: Orthopedic Surgery | Admitting: Orthopedic Surgery

## 2019-01-16 ENCOUNTER — Other Ambulatory Visit: Payer: Self-pay

## 2019-01-16 DIAGNOSIS — M25562 Pain in left knee: Secondary | ICD-10-CM | POA: Diagnosis not present

## 2019-01-18 DIAGNOSIS — M5416 Radiculopathy, lumbar region: Secondary | ICD-10-CM | POA: Diagnosis not present

## 2019-01-24 DIAGNOSIS — G4733 Obstructive sleep apnea (adult) (pediatric): Secondary | ICD-10-CM | POA: Diagnosis not present

## 2019-01-25 DIAGNOSIS — R600 Localized edema: Secondary | ICD-10-CM | POA: Diagnosis not present

## 2019-01-25 DIAGNOSIS — I8312 Varicose veins of left lower extremity with inflammation: Secondary | ICD-10-CM | POA: Diagnosis not present

## 2019-01-25 DIAGNOSIS — I8311 Varicose veins of right lower extremity with inflammation: Secondary | ICD-10-CM | POA: Diagnosis not present

## 2019-01-25 DIAGNOSIS — I83813 Varicose veins of bilateral lower extremities with pain: Secondary | ICD-10-CM | POA: Diagnosis not present

## 2019-01-26 ENCOUNTER — Other Ambulatory Visit: Payer: BC Managed Care – PPO

## 2019-01-27 DIAGNOSIS — K529 Noninfective gastroenteritis and colitis, unspecified: Secondary | ICD-10-CM | POA: Diagnosis not present

## 2019-01-27 DIAGNOSIS — R197 Diarrhea, unspecified: Secondary | ICD-10-CM | POA: Diagnosis not present

## 2019-01-30 ENCOUNTER — Ambulatory Visit: Payer: BC Managed Care – PPO | Admitting: Podiatry

## 2019-01-31 DIAGNOSIS — M5416 Radiculopathy, lumbar region: Secondary | ICD-10-CM | POA: Diagnosis not present

## 2019-02-02 DIAGNOSIS — L819 Disorder of pigmentation, unspecified: Secondary | ICD-10-CM | POA: Diagnosis not present

## 2019-02-02 DIAGNOSIS — L718 Other rosacea: Secondary | ICD-10-CM | POA: Diagnosis not present

## 2019-02-02 DIAGNOSIS — L570 Actinic keratosis: Secondary | ICD-10-CM | POA: Diagnosis not present

## 2019-02-03 DIAGNOSIS — R600 Localized edema: Secondary | ICD-10-CM | POA: Diagnosis not present

## 2019-02-06 ENCOUNTER — Other Ambulatory Visit: Payer: BC Managed Care – PPO

## 2019-02-08 ENCOUNTER — Ambulatory Visit: Payer: BC Managed Care – PPO | Admitting: Podiatry

## 2019-02-09 DIAGNOSIS — M5416 Radiculopathy, lumbar region: Secondary | ICD-10-CM | POA: Diagnosis not present

## 2019-02-09 DIAGNOSIS — M4807 Spinal stenosis, lumbosacral region: Secondary | ICD-10-CM | POA: Diagnosis not present

## 2019-02-09 DIAGNOSIS — M5127 Other intervertebral disc displacement, lumbosacral region: Secondary | ICD-10-CM | POA: Diagnosis not present

## 2019-02-10 ENCOUNTER — Other Ambulatory Visit: Payer: Self-pay | Admitting: Neurological Surgery

## 2019-02-14 DIAGNOSIS — M25562 Pain in left knee: Secondary | ICD-10-CM | POA: Diagnosis not present

## 2019-02-22 DIAGNOSIS — M94262 Chondromalacia, left knee: Secondary | ICD-10-CM | POA: Diagnosis not present

## 2019-02-22 DIAGNOSIS — M23004 Cystic meniscus, unspecified medial meniscus, left knee: Secondary | ICD-10-CM | POA: Diagnosis not present

## 2019-02-22 DIAGNOSIS — G8918 Other acute postprocedural pain: Secondary | ICD-10-CM | POA: Diagnosis not present

## 2019-02-22 DIAGNOSIS — M23041 Cystic meniscus, anterior horn of lateral meniscus, right knee: Secondary | ICD-10-CM | POA: Diagnosis not present

## 2019-02-27 ENCOUNTER — Other Ambulatory Visit (HOSPITAL_COMMUNITY): Payer: Self-pay | Admitting: Orthopedic Surgery

## 2019-02-27 ENCOUNTER — Other Ambulatory Visit: Payer: Self-pay | Admitting: Orthopedic Surgery

## 2019-02-27 DIAGNOSIS — M25562 Pain in left knee: Secondary | ICD-10-CM

## 2019-02-27 DIAGNOSIS — M79605 Pain in left leg: Secondary | ICD-10-CM

## 2019-02-27 DIAGNOSIS — M7989 Other specified soft tissue disorders: Secondary | ICD-10-CM

## 2019-02-28 ENCOUNTER — Ambulatory Visit (HOSPITAL_COMMUNITY)
Admission: RE | Admit: 2019-02-28 | Discharge: 2019-02-28 | Disposition: A | Discharge status: Home / Self Care | Payer: BC Managed Care – PPO | Source: Ambulatory Visit | Attending: Orthopedic Surgery | Admitting: Orthopedic Surgery

## 2019-02-28 ENCOUNTER — Other Ambulatory Visit: Payer: Self-pay

## 2019-02-28 DIAGNOSIS — M7989 Other specified soft tissue disorders: Secondary | ICD-10-CM | POA: Insufficient documentation

## 2019-02-28 DIAGNOSIS — M79605 Pain in left leg: Secondary | ICD-10-CM | POA: Diagnosis not present

## 2019-02-28 NOTE — Progress Notes (Signed)
Left lower extremity venous duplex completed. Preliminary results in Chart review CV Proc. Garland Ulyses Panico,RVS 02/28/2019, 10:14 AM

## 2019-03-01 NOTE — Pre-Procedure Instructions (Signed)
Shanicka Kaltz [facility]  03/01/2019      [id number] [place], [place] [address]. AT [address] [place] [address] Phone: [phone] Fax: [phone]    Your procedure is scheduled on Sept. 16  Report to [facility] Entrance A at 7:45 A.M.  Call this number if you have problems the morning of surgery:  [phone]   Remember:  Do not eat or drink after midnight.      Take these medicines the morning of surgery with A SIP OF WATER :              brexpiprazole (rexulti)             Duloxetine (cymbalta)             Estradiol (estrace)             Famotidine (pepcid)             Levothyroxine (synthroid)             zofran if needed             Rosuvastatin (crestor)             Tizanidine (zanaflex) if needed              viibryd         7 days prior to surgery STOP taking any Aspirin (unless otherwise instructed by your surgeon), Aleve, Naproxen, Ibuprofen, Motrin, Advil, Goody's, BC's, all herbal medications, fish oil, and all vitamins.                               Do not wear jewelry, make-up or nail polish.  Do not wear lotions, powders, or perfumes, or deodorant.  Do not shave 48 hours prior to surgery.  Men may shave face and neck.  Do not bring valuables to the hospital.  [facility] is not responsible for any belongings or valuables.  Contacts, dentures or bridgework may not be worn into surgery.  Leave your suitcase in the car.  After surgery it may be brought to your room.  For patients admitted to the hospital, discharge time will be determined by your treatment team.  Patients discharged the day of surgery will not be allowed to drive home.    Special instructions:  [facility]- Preparing For Surgery  Before surgery, you can play an important role. Because skin is not sterile, your skin needs to be as free of germs as possible. You can reduce the number of germs on your skin by  washing with CHG (chlorahexidine gluconate) Soap before surgery.  CHG is an antiseptic cleaner which kills germs and bonds with the skin to continue killing germs even after washing.    Oral Hygiene is also important to reduce your risk of infection.  Remember - BRUSH YOUR TEETH THE MORNING OF SURGERY WITH YOUR REGULAR TOOTHPASTE  Please do not use if you have an allergy to CHG or antibacterial soaps. If your skin becomes reddened/irritated stop using the CHG.  Do not shave (including legs and underarms) for at least 48 hours prior to first CHG shower. It is OK to shave your face.  Please follow these instructions carefully.   1. Shower the NIGHT BEFORE SURGERY and the MORNING OF SURGERY with CHG.   2. If you chose to wash your hair, wash your hair first as  usual with your normal shampoo.  3. After you shampoo, rinse your hair and body thoroughly to remove the shampoo.  4. Use CHG as you would any other liquid soap. You can apply CHG directly to the skin and wash gently with a scrungie or a clean washcloth.   5. Apply the CHG Soap to your body ONLY FROM THE NECK DOWN.  Do not use on open wounds or open sores. Avoid contact with your eyes, ears, mouth and genitals (private parts). Wash Face and genitals (private parts)  with your normal soap.  6. Wash thoroughly, paying special attention to the area where your surgery will be performed.  7. Thoroughly rinse your body with warm water from the neck down.  8. DO NOT shower/wash with your normal soap after using and rinsing off the CHG Soap.  9. Pat yourself dry with a CLEAN TOWEL.  10. Wear CLEAN PAJAMAS to bed the night before surgery, wear comfortable clothes the morning of surgery  11. Place CLEAN SHEETS on your bed the night of your first shower and DO NOT SLEEP WITH PETS.    Day of Surgery:  Do not apply any deodorants/lotions.  Please wear clean clothes to the hospital/surgery center.   Remember to brush your teeth WITH YOUR  REGULAR TOOTHPASTE.    Please read over the following fact sheets that you were given. Coughing and Deep Breathing, MRSA Information and Surgical Site Infection Prevention

## 2019-03-02 ENCOUNTER — Inpatient Hospital Stay (HOSPITAL_COMMUNITY)
Admission: RE | Admit: 2019-03-02 | Discharge: 2019-03-02 | Disposition: A | Discharge status: Home / Self Care | Payer: BC Managed Care – PPO | Source: Ambulatory Visit

## 2019-03-08 DIAGNOSIS — Z1159 Encounter for screening for other viral diseases: Secondary | ICD-10-CM | POA: Diagnosis not present

## 2019-03-11 ENCOUNTER — Other Ambulatory Visit (HOSPITAL_COMMUNITY): Payer: BC Managed Care – PPO

## 2019-03-16 ENCOUNTER — Other Ambulatory Visit: Payer: Self-pay | Admitting: Family Medicine

## 2019-03-16 DIAGNOSIS — Z8262 Family history of osteoporosis: Secondary | ICD-10-CM | POA: Diagnosis not present

## 2019-03-16 DIAGNOSIS — Z23 Encounter for immunization: Secondary | ICD-10-CM | POA: Diagnosis not present

## 2019-03-16 DIAGNOSIS — R0609 Other forms of dyspnea: Secondary | ICD-10-CM | POA: Diagnosis not present

## 2019-03-16 DIAGNOSIS — R110 Nausea: Secondary | ICD-10-CM | POA: Diagnosis not present

## 2019-03-16 DIAGNOSIS — E2839 Other primary ovarian failure: Secondary | ICD-10-CM

## 2019-04-05 NOTE — Progress Notes (Signed)
[id number] [place], [place] [address]. AT [address]. [place] [address] Phone: [phone] Fax: [phone]    Your procedure is scheduled on Wednesday, October 14th.  Report to [facility] Main Entrance "A" at 7:45 A.M., and check in at the Admitting office.  Call this number if you have problems the morning of surgery:  [phone]  Call [phone] if you have any questions prior to your surgery date Monday-Friday 8am-4pm   Remember:  Do not eat or drink after midnight the night before your surgery    Take these medicines the morning of surgery with A SIP OF WATER  Brexpiprazole (REXULTI)  DULoxetine (CYMBALTA)  estradiol (ESTRACE)  famotidine (PEPCID)  levothyroxine (SYNTHROID, LEVOTHROID)  rosuvastatin (CRESTOR)  VIIBRYD  If needed - tizanidine (ZANAFLEX), valACYclovir (VALTREX)   As of today, STOP taking any Aspirin (unless otherwise instructed by your surgeon), Aleve, Naproxen, Ibuprofen, Motrin, Advil, Goody's, BC's, all herbal medications, fish oil, and all vitamins.   The Morning of Surgery  Do not wear jewelry, make-up or nail polish.  Do not wear lotions, powders, or perfumes/colognes, or deodorant  Do not shave 48 hours prior to surgery.    Do not bring valuables to the hospital.  [facility] is not responsible for any belongings or valuables.  If you are a smoker, DO NOT Smoke 24 hours prior to surgery IF you wear a CPAP at night please bring your mask, tubing, and machine the morning of surgery   Remember that you must have someone to transport you home after your surgery, and remain with you for 24 hours if you are discharged the same day.  Contacts, glasses, hearing aids, dentures or bridgework may not be worn into surgery.   Leave your suitcase in the car.  After surgery it may be brought to your room.  For patients admitted to the hospital, discharge time will be determined by your  treatment team.  Patients discharged the day of surgery will not be allowed to drive home.   Special instructions:   [facility]- Preparing For Surgery  Before surgery, you can play an important role. Because skin is not sterile, your skin needs to be as free of germs as possible. You can reduce the number of germs on your skin by washing with CHG (chlorahexidine gluconate) Soap before surgery.  CHG is an antiseptic cleaner which kills germs and bonds with the skin to continue killing germs even after washing.    Oral Hygiene is also important to reduce your risk of infection.  Remember - BRUSH YOUR TEETH THE MORNING OF SURGERY WITH YOUR REGULAR TOOTHPASTE  Please do not use if you have an allergy to CHG or antibacterial soaps. If your skin becomes reddened/irritated stop using the CHG.  Do not shave (including legs and underarms) for at least 48 hours prior to first CHG shower. It is OK to shave your face.  Please follow these instructions carefully.   1. Shower the NIGHT BEFORE SURGERY and the MORNING OF SURGERY with CHG Soap.   2. If you chose to wash your hair, wash your hair first as usual with your normal shampoo.  3. After you shampoo, rinse your hair and body thoroughly to remove the shampoo.  4. Use CHG as you would any other liquid soap. You can apply CHG directly to the skin and wash gently with a scrungie or a clean washcloth.   5. Apply the CHG Soap to your  body ONLY FROM THE NECK DOWN.  Do not use on open wounds or open sores. Avoid contact with your eyes, ears, mouth and genitals (private parts). Wash Face and genitals (private parts)  with your normal soap.   6. Wash thoroughly, paying special attention to the area where your surgery will be performed.  7. Thoroughly rinse your body with warm water from the neck down.  8. DO NOT shower/wash with your normal soap after using and rinsing off the CHG Soap.  9. Pat yourself dry with a CLEAN TOWEL.  10. Wear CLEAN PAJAMAS  to bed the night before surgery, wear comfortable clothes the morning of surgery  11. Place CLEAN SHEETS on your bed the night of your first shower and DO NOT SLEEP WITH PETS.  Day of Surgery: Do not apply any deodorants/lotions. Please shower the morning of surgery with the CHG soap  Please wear clean clothes to the hospital/surgery center.   Remember to brush your teeth WITH YOUR REGULAR TOOTHPASTE.  Please read over the following fact sheets that you were given.

## 2019-04-06 ENCOUNTER — Encounter (HOSPITAL_COMMUNITY): Payer: Self-pay

## 2019-04-06 ENCOUNTER — Other Ambulatory Visit: Payer: Self-pay

## 2019-04-06 ENCOUNTER — Ambulatory Visit (HOSPITAL_COMMUNITY)
Admission: RE | Admit: 2019-04-06 | Discharge: 2019-04-06 | Disposition: A | Discharge status: Home / Self Care | Payer: BC Managed Care – PPO | Source: Ambulatory Visit | Attending: Neurological Surgery | Admitting: Neurological Surgery

## 2019-04-06 ENCOUNTER — Encounter (HOSPITAL_COMMUNITY)
Admission: RE | Admit: 2019-04-06 | Discharge: 2019-04-06 | Disposition: A | Discharge status: Home / Self Care | Payer: BC Managed Care – PPO | Source: Ambulatory Visit | Attending: Neurological Surgery | Admitting: Neurological Surgery

## 2019-04-06 DIAGNOSIS — M5416 Radiculopathy, lumbar region: Secondary | ICD-10-CM

## 2019-04-06 DIAGNOSIS — Z01818 Encounter for other preprocedural examination: Secondary | ICD-10-CM | POA: Diagnosis not present

## 2019-04-06 HISTORY — DX: Essential (primary) hypertension: I10

## 2019-04-06 HISTORY — DX: Nausea with vomiting, unspecified: R11.2

## 2019-04-06 HISTORY — DX: Other specified postprocedural states: Z98.890

## 2019-04-06 LAB — CBC WITH DIFFERENTIAL/PLATELET
Abs Immature Granulocytes: 0.04 10*3/uL (ref 0.00–0.07)
Basophils Absolute: 0 10*3/uL (ref 0.0–0.1)
Basophils Relative: 0 %
Eosinophils Absolute: 0.2 10*3/uL (ref 0.0–0.5)
Eosinophils Relative: 3 %
HCT: 47.7 % — ABNORMAL HIGH (ref 36.0–46.0)
Hemoglobin: 15.9 g/dL — ABNORMAL HIGH (ref 12.0–15.0)
Immature Granulocytes: 1 %
Lymphocytes Relative: 23 %
Lymphs Abs: 2 10*3/uL (ref 0.7–4.0)
MCH: 29.3 pg (ref 26.0–34.0)
MCHC: 33.3 g/dL (ref 30.0–36.0)
MCV: 87.8 fL (ref 80.0–100.0)
Monocytes Absolute: 0.7 10*3/uL (ref 0.1–1.0)
Monocytes Relative: 8 %
Neutro Abs: 5.6 10*3/uL (ref 1.7–7.7)
Neutrophils Relative %: 65 %
Platelets: 241 10*3/uL (ref 150–400)
RBC: 5.43 MIL/uL — ABNORMAL HIGH (ref 3.87–5.11)
RDW: 14.3 % (ref 11.5–15.5)
WBC: 8.5 10*3/uL (ref 4.0–10.5)
nRBC: 0 % (ref 0.0–0.2)

## 2019-04-06 LAB — SURGICAL PCR SCREEN
MRSA, PCR: NEGATIVE
Staphylococcus aureus: POSITIVE — AB

## 2019-04-06 LAB — BASIC METABOLIC PANEL
Anion gap: 9 (ref 5–15)
BUN: 8 mg/dL (ref 6–20)
CO2: 25 mmol/L (ref 22–32)
Calcium: 9.5 mg/dL (ref 8.9–10.3)
Chloride: 105 mmol/L (ref 98–111)
Creatinine, Ser: 0.72 mg/dL (ref 0.44–1.00)
GFR calc Af Amer: >60 mL/min (ref >60)
GFR calc non Af Amer: >60 mL/min (ref >60)
Glucose, Bld: 99 mg/dL (ref 70–99)
Potassium: 4.1 mmol/L (ref 3.5–5.1)
Sodium: 139 mmol/L (ref 135–145)

## 2019-04-06 LAB — TYPE AND SCREEN
ABO/RH(D): O NEG
Antibody Screen: NEGATIVE

## 2019-04-06 LAB — PROTIME-INR
INR: 1 (ref 0.8–1.2)
Prothrombin Time: 13.3 seconds (ref 11.4–15.2)

## 2019-04-06 MED ORDER — CHLORHEXIDINE GLUCONATE CLOTH 2 % EX PADS: 6.0000 | MEDICATED_PAD | Freq: Once | CUTANEOUS | Status: DC

## 2019-04-07 NOTE — Anesthesia Preprocedure Evaluation (Addendum)
Anesthesia Evaluation  Patient identified by MRN, date of birth, ID band Patient awake    Reviewed: Allergy & Precautions, NPO status , Patient's Chart, lab work & pertinent test results  History of Anesthesia Complications (+) PONV and history of anesthetic complications  Airway Mallampati: III  TM Distance: >3 FB Neck ROM: Full    Dental  (+) Teeth Intact, Dental Advisory Given   Pulmonary sleep apnea and Continuous Positive Airway Pressure Ventilation , Current Smoker and Patient abstained from smoking.,    Pulmonary exam normal breath sounds clear to auscultation       Cardiovascular hypertension, negative cardio ROS Normal cardiovascular exam+ dysrhythmias (WPW s/p ablation)  Rhythm:Regular Rate:Normal     Neuro/Psych  Headaches, PSYCHIATRIC DISORDERS Anxiety Depression  Neuromuscular disease    GI/Hepatic negative GI ROS, Neg liver ROS, GERD  Medicated and Controlled,  Endo/Other  negative endocrine ROSHypothyroidism   Renal/GU negative Renal ROS     Musculoskeletal  (+) Arthritis , Rheumatoid disorders,  Fibromyalgia -  Abdominal   Peds  (+) ATTENTION DEFICIT DISORDER WITHOUT HYPERACTIVITY Hematology negative hematology ROS (+)   Anesthesia Other Findings Day of surgery medications reviewed with the patient.  Reproductive/Obstetrics                            Anesthesia Physical Anesthesia Plan  ASA: III  Anesthesia Plan: General   Post-op Pain Management:    Induction: Intravenous  PONV Risk Score and Plan: 4 or greater and Diphenhydramine, Scopolamine patch - Pre-op, Midazolam, Dexamethasone and Ondansetron  Airway Management Planned: Oral ETT and Video Laryngoscope Planned  Additional Equipment:   Intra-op Plan:   Post-operative Plan: Extubation in OR  Informed Consent: I have reviewed the patients History and Physical, chart, labs and discussed the procedure  including the risks, benefits and alternatives for the proposed anesthesia with the patient or authorized representative who has indicated his/her understanding and acceptance.     Dental advisory given  Plan Discussed with: CRNA  Anesthesia Plan Comments: (Follows with Dr. Marijo File for hx of WPW s/p ablation and more recently DOE. Per notes, Dr. Marijo File advised symptoms more likely due to inactivity and weight gain, but would order stress echo due to risk factors and family hx. Stress echo done 11/28/18 showed no ischemia, EF >50%. She was subsequently cleared for surgery. Cardiac clearance states "As of her tele-visit in April she was stable for the planned procedure. She has no cardiac contraindication to surgery. No further testing is required at this time."  Preop labs reviewed, WNL.  Of note, she has a history of cervical spine fusion.  EKG 08/25/18 (copy on chart): Sinus rhythm. Rate 88.  Stress echo 11/28/18 (care everywhere): Conclusions Summary Normal Stress Echocardiogram with no ischemia suggested. LVEF >50%. Functional capacity is below average for age/sex -5 mets on the 2-min Bruce protocol )       Anesthesia Quick Evaluation

## 2019-04-07 NOTE — Progress Notes (Signed)
Anesthesia Chart Review: Follows with Dr. Marijo File for hx of WPW s/p ablation and more recently DOE. Per notes, Dr. Marijo File advised symptoms more likely due to inactivity and weight gain, but would order stress echo due to risk factors and family hx. Stress echo done 11/28/18 showed no ischemia, EF >50%. She was subsequently cleared for surgery. Cardiac clearance states "As of her tele-visit in April she was stable for the planned procedure. She has no cardiac contraindication to surgery. No further testing is required at this time."  Preop labs reviewed, WNL.  Of note, she has a history of cervical spine fusion.  EKG 08/25/18 (copy on chart): Sinus rhythm. Rate 88.  Stress echo 11/28/18 (care everywhere): Conclusions Summary Normal Stress Echocardiogram with no ischemia suggested. LVEF >50%. Functional capacity is below average for age/sex -5 mets on the 2-min Bruce Protocol    [name] [facility] Short Stay Center/Anesthesiology Phone [phone] 04/07/2019 3:17 PM

## 2019-04-08 ENCOUNTER — Other Ambulatory Visit (HOSPITAL_COMMUNITY)
Admission: RE | Admit: 2019-04-08 | Discharge: 2019-04-08 | Disposition: A | Discharge status: Home / Self Care | Payer: BC Managed Care – PPO | Source: Ambulatory Visit | Attending: Neurological Surgery | Admitting: Neurological Surgery

## 2019-04-08 DIAGNOSIS — Z01812 Encounter for preprocedural laboratory examination: Secondary | ICD-10-CM | POA: Insufficient documentation

## 2019-04-08 DIAGNOSIS — Z20828 Contact with and (suspected) exposure to other viral communicable diseases: Secondary | ICD-10-CM | POA: Insufficient documentation

## 2019-04-09 LAB — NOVEL CORONAVIRUS, NAA (HOSP ORDER, SEND-OUT TO REF LAB; TAT 18-24 HRS): SARS-CoV-2, NAA: NOT DETECTED

## 2019-04-11 ENCOUNTER — Encounter (HOSPITAL_COMMUNITY): Payer: Self-pay | Admitting: Certified Registered"

## 2019-04-11 DIAGNOSIS — F900 Attention-deficit hyperactivity disorder, predominantly inattentive type: Secondary | ICD-10-CM | POA: Diagnosis not present

## 2019-04-11 DIAGNOSIS — F341 Dysthymic disorder: Secondary | ICD-10-CM | POA: Diagnosis not present

## 2019-04-12 ENCOUNTER — Encounter (HOSPITAL_COMMUNITY): Payer: Self-pay

## 2019-04-12 ENCOUNTER — Inpatient Hospital Stay (HOSPITAL_COMMUNITY): Payer: BC Managed Care – PPO | Admitting: Certified Registered"

## 2019-04-12 ENCOUNTER — Encounter (HOSPITAL_COMMUNITY)
Admission: RE | Disposition: A | Discharge status: Home / Self Care | Payer: Self-pay | Source: Home / Self Care | Attending: Neurological Surgery

## 2019-04-12 ENCOUNTER — Inpatient Hospital Stay (HOSPITAL_COMMUNITY): Payer: BC Managed Care – PPO | Admitting: Physician Assistant

## 2019-04-12 ENCOUNTER — Other Ambulatory Visit: Payer: Self-pay

## 2019-04-12 ENCOUNTER — Inpatient Hospital Stay (HOSPITAL_COMMUNITY): Payer: BC Managed Care – PPO

## 2019-04-12 ENCOUNTER — Inpatient Hospital Stay (HOSPITAL_COMMUNITY)
Admission: RE | Admit: 2019-04-12 | Discharge: 2019-04-13 | DRG: 455 | Disposition: A | Discharge status: Home / Self Care | Payer: BC Managed Care – PPO | Attending: Neurological Surgery | Admitting: Neurological Surgery

## 2019-04-12 DIAGNOSIS — E7849 Other hyperlipidemia: Secondary | ICD-10-CM | POA: Diagnosis not present

## 2019-04-12 DIAGNOSIS — K219 Gastro-esophageal reflux disease without esophagitis: Secondary | ICD-10-CM | POA: Diagnosis not present

## 2019-04-12 DIAGNOSIS — Z90710 Acquired absence of both cervix and uterus: Secondary | ICD-10-CM | POA: Diagnosis not present

## 2019-04-12 DIAGNOSIS — Z20828 Contact with and (suspected) exposure to other viral communicable diseases: Secondary | ICD-10-CM | POA: Diagnosis present

## 2019-04-12 DIAGNOSIS — F17210 Nicotine dependence, cigarettes, uncomplicated: Secondary | ICD-10-CM | POA: Diagnosis not present

## 2019-04-12 DIAGNOSIS — M064 Inflammatory polyarthropathy: Secondary | ICD-10-CM | POA: Diagnosis not present

## 2019-04-12 DIAGNOSIS — I10 Essential (primary) hypertension: Secondary | ICD-10-CM | POA: Diagnosis present

## 2019-04-12 DIAGNOSIS — Z981 Arthrodesis status: Secondary | ICD-10-CM

## 2019-04-12 DIAGNOSIS — M5126 Other intervertebral disc displacement, lumbar region: Secondary | ICD-10-CM | POA: Diagnosis not present

## 2019-04-12 DIAGNOSIS — E039 Hypothyroidism, unspecified: Secondary | ICD-10-CM | POA: Diagnosis present

## 2019-04-12 DIAGNOSIS — Z7951 Long term (current) use of inhaled steroids: Secondary | ICD-10-CM

## 2019-04-12 DIAGNOSIS — M532X6 Spinal instabilities, lumbar region: Secondary | ICD-10-CM | POA: Diagnosis not present

## 2019-04-12 DIAGNOSIS — M069 Rheumatoid arthritis, unspecified: Secondary | ICD-10-CM | POA: Diagnosis present

## 2019-04-12 DIAGNOSIS — M5417 Radiculopathy, lumbosacral region: Secondary | ICD-10-CM | POA: Diagnosis not present

## 2019-04-12 DIAGNOSIS — M5136 Other intervertebral disc degeneration, lumbar region: Principal | ICD-10-CM | POA: Diagnosis present

## 2019-04-12 DIAGNOSIS — E890 Postprocedural hypothyroidism: Secondary | ICD-10-CM | POA: Diagnosis not present

## 2019-04-12 DIAGNOSIS — Z79899 Other long term (current) drug therapy: Secondary | ICD-10-CM

## 2019-04-12 DIAGNOSIS — M4327 Fusion of spine, lumbosacral region: Secondary | ICD-10-CM | POA: Diagnosis not present

## 2019-04-12 DIAGNOSIS — M797 Fibromyalgia: Secondary | ICD-10-CM | POA: Diagnosis not present

## 2019-04-12 DIAGNOSIS — M48061 Spinal stenosis, lumbar region without neurogenic claudication: Secondary | ICD-10-CM | POA: Diagnosis not present

## 2019-04-12 DIAGNOSIS — M5137 Other intervertebral disc degeneration, lumbosacral region: Secondary | ICD-10-CM | POA: Diagnosis present

## 2019-04-12 DIAGNOSIS — Z79890 Hormone replacement therapy (postmenopausal): Secondary | ICD-10-CM | POA: Diagnosis not present

## 2019-04-12 DIAGNOSIS — M961 Postlaminectomy syndrome, not elsewhere classified: Secondary | ICD-10-CM | POA: Diagnosis not present

## 2019-04-12 DIAGNOSIS — Z419 Encounter for procedure for purposes other than remedying health state, unspecified: Secondary | ICD-10-CM

## 2019-04-12 SURGERY — POSTERIOR LUMBAR FUSION 2 LEVEL
Anesthesia: General | Site: Back

## 2019-04-12 MED ORDER — ONDANSETRON HCL 4 MG/2ML IJ SOLN: 4.0000 mg | Freq: Four times a day (QID) | INTRAMUSCULAR | Status: DC | PRN

## 2019-04-12 MED ORDER — MIDAZOLAM HCL 5 MG/5ML IJ SOLN
INTRAMUSCULAR | Status: DC | PRN
  Administered 2019-04-12: 2 mg via INTRAVENOUS

## 2019-04-12 MED ORDER — FENTANYL CITRATE (PF) 100 MCG/2ML IJ SOLN
INTRAMUSCULAR | Status: AC
  Filled 2019-04-12: qty 2

## 2019-04-12 MED ORDER — LEVOTHYROXINE SODIUM 75 MCG PO TABS
175.0000 ug | ORAL_TABLET | Freq: Every day | ORAL | Status: DC
  Administered 2019-04-13: 175 ug via ORAL
  Filled 2019-04-12: qty 1

## 2019-04-12 MED ORDER — METOPROLOL TARTRATE 5 MG/5ML IV SOLN
INTRAVENOUS | Status: DC | PRN
  Administered 2019-04-12: 2.5 mg via INTRAVENOUS

## 2019-04-12 MED ORDER — PROPOFOL 10 MG/ML IV BOLUS
INTRAVENOUS | Status: AC
  Filled 2019-04-12: qty 20

## 2019-04-12 MED ORDER — ONDANSETRON HCL 4 MG/2ML IJ SOLN
INTRAMUSCULAR | Status: AC
  Filled 2019-04-12: qty 2

## 2019-04-12 MED ORDER — DEXAMETHASONE 4 MG PO TABS
4.0000 mg | ORAL_TABLET | Freq: Four times a day (QID) | ORAL | Status: DC
  Administered 2019-04-12 – 2019-04-13 (×2): 4 mg via ORAL
  Filled 2019-04-12 (×2): qty 1

## 2019-04-12 MED ORDER — IVERMECTIN 1 % EX CREA: 1.0000 "application " | TOPICAL_CREAM | Freq: Every evening | CUTANEOUS | Status: DC

## 2019-04-12 MED ORDER — SODIUM CHLORIDE 0.9 % IV SOLN: 250.0000 mL | INTRAVENOUS | Status: DC

## 2019-04-12 MED ORDER — SCOPOLAMINE 1 MG/3DAYS TD PT72
MEDICATED_PATCH | TRANSDERMAL | Status: DC | PRN
  Administered 2019-04-12: 1 via TRANSDERMAL

## 2019-04-12 MED ORDER — KETAMINE HCL 10 MG/ML IJ SOLN
INTRAMUSCULAR | Status: DC | PRN
  Administered 2019-04-12: 50 mg via INTRAVENOUS

## 2019-04-12 MED ORDER — PROPOFOL 10 MG/ML IV BOLUS
INTRAVENOUS | Status: DC | PRN
  Administered 2019-04-12: 200 mg via INTRAVENOUS

## 2019-04-12 MED ORDER — CLONAZEPAM 1 MG PO TABS
4.0000 mg | ORAL_TABLET | Freq: Every day | ORAL | Status: DC
  Administered 2019-04-12: 4 mg via ORAL
  Filled 2019-04-12: qty 4

## 2019-04-12 MED ORDER — HEPARIN SODIUM (PORCINE) 1000 UNIT/ML IJ SOLN
INTRAMUSCULAR | Status: DC | PRN
  Administered 2019-04-12: 5000 [IU]

## 2019-04-12 MED ORDER — PROMETHAZINE HCL 25 MG/ML IJ SOLN: 6.2500 mg | INTRAMUSCULAR | Status: DC | PRN

## 2019-04-12 MED ORDER — ONDANSETRON HCL 4 MG PO TABS: 4.0000 mg | ORAL_TABLET | Freq: Four times a day (QID) | ORAL | Status: DC | PRN

## 2019-04-12 MED ORDER — FAMOTIDINE 20 MG PO TABS: 40.0000 mg | ORAL_TABLET | Freq: Every day | ORAL | Status: DC

## 2019-04-12 MED ORDER — KETAMINE HCL 50 MG/5ML IJ SOSY
PREFILLED_SYRINGE | INTRAMUSCULAR | Status: AC
  Filled 2019-04-12: qty 5

## 2019-04-12 MED ORDER — SODIUM CHLORIDE 0.9% FLUSH
3.0000 mL | Freq: Two times a day (BID) | INTRAVENOUS | Status: DC
  Administered 2019-04-12: 21:00:00 3 mL via INTRAVENOUS

## 2019-04-12 MED ORDER — FENTANYL CITRATE (PF) 250 MCG/5ML IJ SOLN
INTRAMUSCULAR | Status: AC
  Filled 2019-04-12: qty 5

## 2019-04-12 MED ORDER — SCOPOLAMINE 1 MG/3DAYS TD PT72
MEDICATED_PATCH | TRANSDERMAL | Status: AC
  Filled 2019-04-12: qty 1

## 2019-04-12 MED ORDER — BREXPIPRAZOLE 1 MG PO TABS
3.0000 mg | ORAL_TABLET | Freq: Every day | ORAL | Status: DC
  Filled 2019-04-12: qty 3

## 2019-04-12 MED ORDER — MORPHINE SULFATE (PF) 4 MG/ML IV SOLN
INTRAVENOUS | Status: AC
  Filled 2019-04-12: qty 1

## 2019-04-12 MED ORDER — PHENTERMINE HCL 37.5 MG PO TABS: 37.5000 mg | ORAL_TABLET | Freq: Every day | ORAL | Status: DC

## 2019-04-12 MED ORDER — CEFAZOLIN SODIUM-DEXTROSE 2-4 GM/100ML-% IV SOLN
2.0000 g | INTRAVENOUS | Status: AC
  Administered 2019-04-12: 10:00:00 2 g via INTRAVENOUS

## 2019-04-12 MED ORDER — SODIUM CHLORIDE 0.9 % IV SOLN
INTRAVENOUS | Status: DC | PRN
  Administered 2019-04-12: 500 mL

## 2019-04-12 MED ORDER — ESTRADIOL 1 MG PO TABS
1.0000 mg | ORAL_TABLET | Freq: Every day | ORAL | Status: DC
  Filled 2019-04-12: qty 1

## 2019-04-12 MED ORDER — CELECOXIB 200 MG PO CAPS
200.0000 mg | ORAL_CAPSULE | Freq: Two times a day (BID) | ORAL | Status: DC
  Administered 2019-04-12: 21:00:00 200 mg via ORAL
  Filled 2019-04-12: qty 1

## 2019-04-12 MED ORDER — MENTHOL 3 MG MT LOZG: 1.0000 | LOZENGE | OROMUCOSAL | Status: DC | PRN

## 2019-04-12 MED ORDER — KETOROLAC TROMETHAMINE 30 MG/ML IJ SOLN
INTRAMUSCULAR | Status: DC | PRN
  Administered 2019-04-12: 30 mg via INTRAVENOUS

## 2019-04-12 MED ORDER — BUPIVACAINE HCL (PF) 0.25 % IJ SOLN
INTRAMUSCULAR | Status: AC
  Filled 2019-04-12: qty 30

## 2019-04-12 MED ORDER — DULOXETINE HCL 30 MG PO CPEP: 60.0000 mg | ORAL_CAPSULE | Freq: Every day | ORAL | Status: DC

## 2019-04-12 MED ORDER — DEXAMETHASONE SODIUM PHOSPHATE 4 MG/ML IJ SOLN
4.0000 mg | Freq: Four times a day (QID) | INTRAMUSCULAR | Status: DC
  Administered 2019-04-13: 4 mg via INTRAVENOUS
  Filled 2019-04-12: qty 1

## 2019-04-12 MED ORDER — HYDROMORPHONE HCL 2 MG PO TABS
4.0000 mg | ORAL_TABLET | ORAL | Status: DC | PRN
  Administered 2019-04-12 – 2019-04-13 (×5): 4 mg via ORAL
  Filled 2019-04-12 (×5): qty 2

## 2019-04-12 MED ORDER — DEXAMETHASONE SODIUM PHOSPHATE 10 MG/ML IJ SOLN
INTRAMUSCULAR | Status: AC
  Filled 2019-04-12: qty 1

## 2019-04-12 MED ORDER — METHOCARBAMOL 1000 MG/10ML IJ SOLN
500.0000 mg | Freq: Four times a day (QID) | INTRAVENOUS | Status: DC | PRN
  Filled 2019-04-12: qty 5

## 2019-04-12 MED ORDER — ROCURONIUM BROMIDE 10 MG/ML (PF) SYRINGE
PREFILLED_SYRINGE | INTRAVENOUS | Status: AC
  Filled 2019-04-12: qty 20

## 2019-04-12 MED ORDER — ACETAMINOPHEN 325 MG PO TABS: 650.0000 mg | ORAL_TABLET | ORAL | Status: DC | PRN

## 2019-04-12 MED ORDER — SUGAMMADEX SODIUM 200 MG/2ML IV SOLN
INTRAVENOUS | Status: DC | PRN
  Administered 2019-04-12: 200 mg via INTRAVENOUS

## 2019-04-12 MED ORDER — ACETAMINOPHEN 650 MG RE SUPP: 650.0000 mg | RECTAL | Status: DC | PRN

## 2019-04-12 MED ORDER — FENTANYL CITRATE (PF) 100 MCG/2ML IJ SOLN
25.0000 ug | INTRAMUSCULAR | Status: DC | PRN
  Administered 2019-04-12 (×2): 50 ug via INTRAVENOUS

## 2019-04-12 MED ORDER — PHENOL 1.4 % MT LIQD: 1.0000 | OROMUCOSAL | Status: DC | PRN

## 2019-04-12 MED ORDER — ACETAMINOPHEN 10 MG/ML IV SOLN
INTRAVENOUS | Status: DC | PRN
  Administered 2019-04-12: 1000 mg via INTRAVENOUS

## 2019-04-12 MED ORDER — DIPHENHYDRAMINE HCL 50 MG/ML IJ SOLN
INTRAMUSCULAR | Status: DC | PRN
  Administered 2019-04-12: 25 mg via INTRAVENOUS

## 2019-04-12 MED ORDER — LIDOCAINE 2% (20 MG/ML) 5 ML SYRINGE
INTRAMUSCULAR | Status: AC
  Filled 2019-04-12: qty 5

## 2019-04-12 MED ORDER — SENNA 8.6 MG PO TABS
1.0000 | ORAL_TABLET | Freq: Two times a day (BID) | ORAL | Status: DC
  Administered 2019-04-12: 8.6 mg via ORAL
  Filled 2019-04-12: qty 1

## 2019-04-12 MED ORDER — ARTHREX ANGEL - ACD-A SOLUTION (CHARTING ONLY) OPTIME
TOPICAL | Status: DC | PRN
  Administered 2019-04-12: 12 mL via TOPICAL

## 2019-04-12 MED ORDER — BUPIVACAINE HCL (PF) 0.25 % IJ SOLN
INTRAMUSCULAR | Status: DC | PRN
  Administered 2019-04-12: 17 mL

## 2019-04-12 MED ORDER — ROCURONIUM BROMIDE 50 MG/5ML IV SOSY
PREFILLED_SYRINGE | INTRAVENOUS | Status: DC | PRN
  Administered 2019-04-12: 40 mg via INTRAVENOUS
  Administered 2019-04-12: 30 mg via INTRAVENOUS
  Administered 2019-04-12: 60 mg via INTRAVENOUS
  Administered 2019-04-12: 20 mg via INTRAVENOUS
  Administered 2019-04-12: 30 mg via INTRAVENOUS

## 2019-04-12 MED ORDER — ALBUTEROL SULFATE (2.5 MG/3ML) 0.083% IN NEBU: 3.0000 mL | INHALATION_SOLUTION | Freq: Four times a day (QID) | RESPIRATORY_TRACT | Status: DC | PRN

## 2019-04-12 MED ORDER — DIPHENHYDRAMINE HCL 50 MG/ML IJ SOLN
INTRAMUSCULAR | Status: AC
  Filled 2019-04-12: qty 1

## 2019-04-12 MED ORDER — LIDOCAINE 2% (20 MG/ML) 5 ML SYRINGE
INTRAMUSCULAR | Status: DC | PRN
  Administered 2019-04-12: 100 mg via INTRAVENOUS

## 2019-04-12 MED ORDER — LACTATED RINGERS IV SOLN
INTRAVENOUS | Status: DC
  Administered 2019-04-12 (×3): via INTRAVENOUS

## 2019-04-12 MED ORDER — VILAZODONE HCL 40 MG PO TABS
40.0000 mg | ORAL_TABLET | Freq: Every day | ORAL | Status: DC
  Filled 2019-04-12: qty 1

## 2019-04-12 MED ORDER — FENTANYL CITRATE (PF) 100 MCG/2ML IJ SOLN
INTRAMUSCULAR | Status: DC | PRN
  Administered 2019-04-12: 100 ug via INTRAVENOUS
  Administered 2019-04-12: 50 ug via INTRAVENOUS
  Administered 2019-04-12 (×2): 100 ug via INTRAVENOUS
  Administered 2019-04-12 (×2): 50 ug via INTRAVENOUS
  Administered 2019-04-12: 150 ug via INTRAVENOUS
  Administered 2019-04-12 (×4): 100 ug via INTRAVENOUS

## 2019-04-12 MED ORDER — CEFAZOLIN SODIUM-DEXTROSE 2-4 GM/100ML-% IV SOLN
2.0000 g | Freq: Three times a day (TID) | INTRAVENOUS | Status: AC
  Administered 2019-04-12 – 2019-04-13 (×2): 2 g via INTRAVENOUS
  Filled 2019-04-12 (×2): qty 100

## 2019-04-12 MED ORDER — MIDAZOLAM HCL 2 MG/2ML IJ SOLN
INTRAMUSCULAR | Status: AC
  Filled 2019-04-12: qty 2

## 2019-04-12 MED ORDER — METHOCARBAMOL 500 MG PO TABS
500.0000 mg | ORAL_TABLET | Freq: Four times a day (QID) | ORAL | Status: DC | PRN
  Administered 2019-04-12 – 2019-04-13 (×2): 500 mg via ORAL
  Filled 2019-04-12 (×2): qty 1

## 2019-04-12 MED ORDER — 0.9 % SODIUM CHLORIDE (POUR BTL) OPTIME
TOPICAL | Status: DC | PRN
  Administered 2019-04-12: 1000 mL

## 2019-04-12 MED ORDER — CEFAZOLIN SODIUM-DEXTROSE 2-4 GM/100ML-% IV SOLN
INTRAVENOUS | Status: AC
  Filled 2019-04-12: qty 100

## 2019-04-12 MED ORDER — ONDANSETRON HCL 4 MG/2ML IJ SOLN
INTRAMUSCULAR | Status: DC | PRN
  Administered 2019-04-12: 4 mg via INTRAVENOUS

## 2019-04-12 MED ORDER — SUVOREXANT 20 MG PO TABS: 20.0000 mg | ORAL_TABLET | Freq: Every day | ORAL | Status: DC

## 2019-04-12 MED ORDER — THROMBIN 5000 UNITS EX SOLR
CUTANEOUS | Status: AC
  Filled 2019-04-12: qty 5000

## 2019-04-12 MED ORDER — THROMBIN 5000 UNITS EX SOLR
OROMUCOSAL | Status: DC | PRN
  Administered 2019-04-12: 5 mL

## 2019-04-12 MED ORDER — HYDROMORPHONE HCL 1 MG/ML IJ SOLN
0.5000 mg | INTRAMUSCULAR | Status: DC | PRN
  Administered 2019-04-12: 0.5 mg via INTRAVENOUS
  Filled 2019-04-12: qty 0.5

## 2019-04-12 MED ORDER — DOXEPIN HCL 100 MG PO CAPS
100.0000 mg | ORAL_CAPSULE | Freq: Every day | ORAL | Status: DC
  Administered 2019-04-12: 21:00:00 100 mg via ORAL
  Filled 2019-04-12: qty 1

## 2019-04-12 MED ORDER — AMPHETAMINE-DEXTROAMPHETAMINE 10 MG PO TABS
20.0000 mg | ORAL_TABLET | Freq: Two times a day (BID) | ORAL | Status: DC
  Filled 2019-04-12: qty 2

## 2019-04-12 MED ORDER — MORPHINE SULFATE (PF) 4 MG/ML IV SOLN
4.0000 mg | INTRAVENOUS | Status: DC | PRN
  Administered 2019-04-12: 19:00:00 4 mg via INTRAVENOUS

## 2019-04-12 MED ORDER — POTASSIUM CHLORIDE IN NACL 20-0.9 MEQ/L-% IV SOLN: INTRAVENOUS | Status: DC

## 2019-04-12 MED ORDER — DEXAMETHASONE SODIUM PHOSPHATE 10 MG/ML IJ SOLN
10.0000 mg | Freq: Once | INTRAMUSCULAR | Status: AC
  Administered 2019-04-12: 10:00:00 10 mg via INTRAVENOUS

## 2019-04-12 MED ORDER — THROMBIN 20000 UNITS EX SOLR
CUTANEOUS | Status: DC | PRN
  Administered 2019-04-12: 20 mL

## 2019-04-12 MED ORDER — ACETAMINOPHEN 10 MG/ML IV SOLN
INTRAVENOUS | Status: AC
  Filled 2019-04-12: qty 100

## 2019-04-12 MED ORDER — HEPARIN SODIUM (PORCINE) 1000 UNIT/ML IJ SOLN
INTRAMUSCULAR | Status: AC
  Filled 2019-04-12: qty 1

## 2019-04-12 MED ORDER — THROMBIN 20000 UNITS EX SOLR
CUTANEOUS | Status: AC
  Filled 2019-04-12: qty 20000

## 2019-04-12 MED ORDER — SODIUM CHLORIDE 0.9% FLUSH: 3.0000 mL | INTRAVENOUS | Status: DC | PRN

## 2019-04-12 MED ORDER — GABAPENTIN 600 MG PO TABS
2400.0000 mg | ORAL_TABLET | Freq: Every day | ORAL | Status: DC
  Administered 2019-04-12: 21:00:00 2400 mg via ORAL
  Filled 2019-04-12: qty 4

## 2019-04-12 SURGICAL SUPPLY — 73 items
ADH SKN CLS APL DERMABOND .7 (GAUZE/BANDAGES/DRESSINGS) ×1
APL SKNCLS STERI-STRIP NONHPOA (GAUZE/BANDAGES/DRESSINGS) ×1
BAG DECANTER FOR FLEXI CONT (MISCELLANEOUS) ×3 IMPLANT
BASKET BONE COLLECTION (BASKET) ×3 IMPLANT
BENZOIN TINCTURE PRP APPL 2/3 (GAUZE/BANDAGES/DRESSINGS) ×3 IMPLANT
BLADE CLIPPER SURG (BLADE) IMPLANT
BUR MATCHSTICK NEURO 3.0 LAGG (BURR) ×3 IMPLANT
CAGE POROUS ATEC 7X9X25 5D (Cage) ×4 IMPLANT
CANISTER SUCT 3000ML PPV (MISCELLANEOUS) ×3 IMPLANT
CARTRIDGE OIL MAESTRO DRILL (MISCELLANEOUS) ×1 IMPLANT
CLOSURE WOUND 1/2 X4 (GAUZE/BANDAGES/DRESSINGS) ×1
CONT SPEC 4OZ CLIKSEAL STRL BL (MISCELLANEOUS) ×3 IMPLANT
COVER BACK TABLE 60X90IN (DRAPES) ×3 IMPLANT
DERMABOND ADVANCED (GAUZE/BANDAGES/DRESSINGS) ×2
DERMABOND ADVANCED .7 DNX12 (GAUZE/BANDAGES/DRESSINGS) ×1 IMPLANT
DIFFUSER DRILL AIR PNEUMATIC (MISCELLANEOUS) ×3 IMPLANT
DRAPE C-ARM 42X72 X-RAY (DRAPES) ×5 IMPLANT
DRAPE C-ARMOR (DRAPES) ×3 IMPLANT
DRAPE LAPAROTOMY 100X72X124 (DRAPES) ×3 IMPLANT
DRAPE SURG 17X23 STRL (DRAPES) ×3 IMPLANT
DRSG OPSITE POSTOP 4X8 (GAUZE/BANDAGES/DRESSINGS) ×2 IMPLANT
DURAPREP 26ML APPLICATOR (WOUND CARE) ×3 IMPLANT
ELECT REM PT RETURN 9FT ADLT (ELECTROSURGICAL) ×3
ELECTRODE REM PT RTRN 9FT ADLT (ELECTROSURGICAL) ×1 IMPLANT
EVACUATOR 1/8 PVC DRAIN (DRAIN) ×3 IMPLANT
GAUZE 4X4 16PLY RFD (DISPOSABLE) IMPLANT
GLOVE BIO SURGEON STRL SZ7 (GLOVE) ×8 IMPLANT
GLOVE BIO SURGEON STRL SZ7.5 (GLOVE) ×2 IMPLANT
GLOVE BIO SURGEON STRL SZ8 (GLOVE) ×6 IMPLANT
GLOVE BIOGEL PI IND STRL 7.0 (GLOVE) IMPLANT
GLOVE BIOGEL PI IND STRL 7.5 (GLOVE) IMPLANT
GLOVE BIOGEL PI INDICATOR 7.0 (GLOVE) ×2
GLOVE BIOGEL PI INDICATOR 7.5 (GLOVE) ×8
GLOVE ECLIPSE 6.5 STRL STRAW (GLOVE) ×2 IMPLANT
GOWN STRL REUS W/ TWL LRG LVL3 (GOWN DISPOSABLE) IMPLANT
GOWN STRL REUS W/ TWL XL LVL3 (GOWN DISPOSABLE) ×2 IMPLANT
GOWN STRL REUS W/TWL 2XL LVL3 (GOWN DISPOSABLE) IMPLANT
GOWN STRL REUS W/TWL LRG LVL3 (GOWN DISPOSABLE) ×9
GOWN STRL REUS W/TWL XL LVL3 (GOWN DISPOSABLE) ×3
HEMOSTAT POWDER KIT SURGIFOAM (HEMOSTASIS) ×2 IMPLANT
KIT BASIN OR (CUSTOM PROCEDURE TRAY) ×3 IMPLANT
KIT BONE MRW ASP ANGEL CPRP (KITS) ×2 IMPLANT
KIT TURNOVER KIT B (KITS) ×3 IMPLANT
MILL MEDIUM DISP (BLADE) ×2 IMPLANT
NDL HYPO 18GX1.5 BLUNT FILL (NEEDLE) IMPLANT
NDL HYPO 25X1 1.5 SAFETY (NEEDLE) ×1 IMPLANT
NEEDLE HYPO 18GX1.5 BLUNT FILL (NEEDLE) ×6 IMPLANT
NEEDLE HYPO 25X1 1.5 SAFETY (NEEDLE) ×3 IMPLANT
NS IRRIG 1000ML POUR BTL (IV SOLUTION) ×3 IMPLANT
OIL CARTRIDGE MAESTRO DRILL (MISCELLANEOUS) ×3
PACK LAMINECTOMY NEURO (CUSTOM PROCEDURE TRAY) ×3 IMPLANT
PAD ARMBOARD 7.5X6 YLW CONV (MISCELLANEOUS) ×9 IMPLANT
PUTTY DBM ALLOSYNC PURE 10CC (Putty) ×2 IMPLANT
ROD LORD LIPPED TI 5.5X55 (Rod) ×4 IMPLANT
SCREW MOD INVICTUS 5.5X40 (Screw) ×8 IMPLANT
SCREW MOD INVICTUS 6.5X40 (Screw) ×4 IMPLANT
SCREW POLYAXIAL TULIP (Screw) ×12 IMPLANT
SET SCREW (Screw) ×18 IMPLANT
SET SCREW SPNE (Screw) IMPLANT
SPACER IDENTITI PS 5D 8X9X25 (Spacer) ×4 IMPLANT
SPONGE LAP 4X18 RFD (DISPOSABLE) IMPLANT
SPONGE SURGIFOAM ABS GEL 100 (HEMOSTASIS) ×3 IMPLANT
STRIP CLOSURE SKIN 1/2X4 (GAUZE/BANDAGES/DRESSINGS) ×3 IMPLANT
SUT VIC AB 0 CT1 18XCR BRD8 (SUTURE) ×1 IMPLANT
SUT VIC AB 0 CT1 8-18 (SUTURE) ×3
SUT VIC AB 2-0 CP2 18 (SUTURE) ×3 IMPLANT
SUT VIC AB 3-0 SH 8-18 (SUTURE) ×6 IMPLANT
SYR 30ML LL (SYRINGE) ×2 IMPLANT
SYR CONTROL 10ML LL (SYRINGE) ×3 IMPLANT
TOWEL GREEN STERILE (TOWEL DISPOSABLE) ×3 IMPLANT
TOWEL GREEN STERILE FF (TOWEL DISPOSABLE) ×3 IMPLANT
TRAY FOLEY MTR SLVR 16FR STAT (SET/KITS/TRAYS/PACK) ×3 IMPLANT
WATER STERILE IRR 1000ML POUR (IV SOLUTION) ×3 IMPLANT

## 2019-04-12 NOTE — Transfer of Care (Signed)
Immediate Anesthesia Transfer of Care Note  Patient: [name]  Procedure(s) Performed: Posterio Lumbar Interbody Fusion - Lumbar Four - Lumbar Five - Lumbar Five - Sacral One (N/A Back)  Patient Location: PACU  Anesthesia Type:General  Level of Consciousness: awake, alert  and oriented  Airway & Oxygen Therapy: Patient Spontanous Breathing and Patient connected to face mask oxygen  Post-op Assessment: Report given to RN and Post -op Vital signs reviewed and stable  Post vital signs: Reviewed and stable  Last Vitals:  Vitals Value Taken Time  BP 134/81 04/12/19 1451  Temp    Pulse 110 04/12/19 1459  Resp 13 04/12/19 1459  SpO2 94 % 04/12/19 1459  Vitals shown include unvalidated device data.  Last Pain:  Vitals:   04/12/19 1450  TempSrc:   PainSc: (P) Asleep      Patients Stated Pain Goal: 4 ([id number])  Complications: No apparent anesthesia complications

## 2019-04-12 NOTE — H&P (Signed)
Subjective: Patient is a 48 y.o. female admitted for plif. Onset of symptoms was several months ago, gradually worsening since that time.  The pain is rated intense, unremitting, and is located at the across the lower back and radiates to legs. The pain is described as aching and occurs all day. The symptoms have been progressive. Symptoms are exacerbated by exercise. MRI or CT showed DDD/ recurrent HNP   Past Medical History:  Diagnosis Date  . ADD (attention deficit disorder)   . Anxiety   . Breast mass 02/2014  . Cervical pseudoarthrosis ([place])   . Chronic back pain   . Chronic constipation   . Complication of anesthesia    after 07/01/2017 cervical c5-t2, pt states she had hallucination x 2 wks after surgery  . Depression   . Fibromyalgia   . GERD (gastroesophageal reflux disease)   . H/O cardiac radiofrequency ablation 1996  . Headache   . History of kidney stones    one lodged in right kidney - 73mm   . HSV infection   . Hypertension   . Hyperthyroidism    at on time - now hypothyroidism  . Hypothyroidism   . Neuromuscular disorder (HCC)    sciatica   . PONV (postoperative nausea and vomiting)   . 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 Sleep apnea    CPAP used nightly  . Wolff-Parkinson-White (WPW) syndrome   . Wolff-Parkinson-White pattern 1994   had ablation surgery    Past Surgical History:  Procedure Laterality Date  . ABDOMINAL HYSTERECTOMY    . BACK SURGERY     x 4 lumbar, 3 cervical  . CARDIAC ELECTROPHYSIOLOGY MAPPING AND ABLATION     WPW  . CERVICAL SPINE SURGERY     fusions x 3  . COLONOSCOPY  04/06/2013  . CYSTOSCOPY N/A 04/06/2018   Procedure: CYSTOSCOPY;  Surgeon: Paula Compton, MD;  Location: [facility];  Service: Gynecology;  Laterality: N/A;  . ESOPHAGOGASTRODUODENOSCOPY  04/06/2013  . LAPAROSCOPY  04/06/2018   Procedure: LAPAROSCOPY DIAGNOSTIC;  Surgeon: Paula Compton, MD;  Location: [facility];  Service: Gynecology;;  . LUMBAR  LAMINECTOMY/DECOMPRESSION MICRODISCECTOMY Right 09/02/2016   Procedure: RIGHT LUMBAR FIVE-SACRAL ONE REDO MICRODISCECTOMY;  Surgeon: Eustace Moore, MD;  Location: [facility];  Service: Neurosurgery;  Laterality: Right;  . LUMBAR SPINE SURGERY     x 4  . POSTERIOR CERVICAL FUSION/FORAMINOTOMY N/A 07/01/2017   Procedure: Posterior Cervical Fusion with lateral mass fixation Cervical Six-Seven;  Surgeon: Eustace Moore, MD;  Location: [facility];  Service: Neurosurgery;  Laterality: N/A;  Posterior Cervical Fusion with lateral mass fixation Cervical Six-Seven  . SIGMOIDOSCOPY    . SUPRACERVICAL ABDOMINAL HYSTERECTOMY N/A 04/06/2018   Procedure: SUPRACERVICAL HYSTERECTOMY ABDOMINAL WITH LEFT OOPHORECTOMY AND BILATERAL SALPINGECTOMY;  Surgeon: Paula Compton, MD;  Location: [facility];  Service: Gynecology;  Laterality: N/A;  . THYROIDECTOMY    . TONSILLECTOMY    . WISDOM TOOTH EXTRACTION      Prior to Admission medications   Medication Sig Start Date End Date Taking? Authorizing Provider  albuterol (VENTOLIN HFA) 108 (90 Base) MCG/ACT inhaler Inhale 1-2 puffs into the lungs every 6 (six) hours as needed for wheezing or shortness of breath.   Yes [provider]  amphetamine-dextroamphetamine (ADDERALL) 20 MG tablet Take 20 mg by mouth 2 (two) times daily.  07/26/16  Yes [provider]  b complex vitamins tablet Take 1 tablet by mouth daily.   Yes [provider]  Cholecalciferol (VITAMIN D-3) 125 MCG (5000 UT) TABS Take 5,000 Units by mouth daily.   Yes [provider]  clonazePAM (KLONOPIN) 2 MG tablet Take 4 mg by mouth at bedtime.  03/03/13  Yes [provider]  doxepin (SINEQUAN) 50 MG capsule Take 100 mg by mouth at bedtime.   Yes [provider]  DULoxetine (CYMBALTA) 60 MG capsule Take 60 mg by mouth daily.   Yes [provider]  estradiol (ESTRACE) 1 MG tablet Take 1 mg by mouth daily.  06/21/18  Yes [provider]  famotidine (PEPCID)  20 MG tablet Take 40 mg by mouth daily.  03/22/18  Yes [provider]  FERREX 150 150 MG capsule Take 150 mg by mouth daily.  08/08/18  Yes [provider]  fluticasone (FLONASE) 50 MCG/ACT nasal spray Place 2 sprays into both nostrils at bedtime.    Yes [provider]  gabapentin (NEURONTIN) 600 MG tablet Take 2,400 mg by mouth at bedtime.  04/28/17  Yes [provider]  levothyroxine (SYNTHROID, LEVOTHROID) 175 MCG tablet Take 175 mcg by mouth daily before breakfast.    Yes [provider]  lidocaine (LIDODERM) 5 % Place 3 patches onto the skin daily as needed (pain).  08/19/16  Yes [provider]  Melatonin 10 MG TABS Take 10 mg by mouth at bedtime.   Yes [provider]  meloxicam (MOBIC) 15 MG tablet Take 15 mg by mouth daily.   Yes [provider]  Multiple Vitamin (MULTIVITAMIN WITH MINERALS) TABS tablet Take 1 tablet by mouth daily.   Yes [provider]  oxyCODONE (ROXICODONE) 15 MG immediate release tablet Take 15 mg by mouth 6 (six) times daily.   Yes [provider]  REXULTI 3 MG TABS Take 3 mg by mouth daily.   Yes [provider]  Suvorexant (BELSOMRA) 20 MG TABS Take 20 mg by mouth at bedtime.   Yes [provider]  VIIBRYD 40 MG TABS Take 40 mg by mouth daily.  01/11/13  Yes [provider]  ondansetron (ZOFRAN-ODT) 4 MG disintegrating tablet Take 4 mg by mouth every 8 (eight) hours as needed for nausea or vomiting.    [provider]  phentermine (ADIPEX-P) 37.5 MG tablet Take 37.5 mg by mouth daily before breakfast.  10/28/18   [provider]  SOOLANTRA 1 % CREA Apply 1 application topically every evening.  02/03/19   [provider]  tiZANidine (ZANAFLEX) 4 MG tablet Take 4 mg by mouth at bedtime as needed for muscle spasms.    [provider]  valACYclovir (VALTREX) 1000 MG tablet Take 2,000 mg by mouth 2 (two) times daily as needed  (outbreaks).  06/13/17   [provider]   Allergies  Allergen Reactions  . Shellfish Allergy Hives    SHRIMP   . Conjugated Estrogens Other (See Comments)    progesterone interacted with antidepressants  . Lactose Intolerance (Gi) Diarrhea    GI Distress    Social History   Tobacco Use  . Smoking status: Current Some Day Smoker    Packs/day: 0.50    Years: 21.00    Pack years: 10.50    Types: Cigarettes    Last attempt to quit: 09/01/2016    Years since quitting: 2.6  . Smokeless tobacco: Former Network engineer Use Topics  . Alcohol use: No    Family History  Problem Relation Age of Onset  . Colon polyps Mother   .  Mitral valve prolapse Mother   . High Cholesterol Mother   . Cardiomyopathy Brother      Review of Systems  Positive ROS: neg  All other systems have been reviewed and were otherwise negative with the exception of those mentioned in the HPI and as above.  Objective: Vital signs in last 24 hours: Temp:  [98.4 F (36.9 C)] 98.4 F (36.9 C) (10/14 0807) Pulse Rate:  [94] 94 (10/14 0807) Resp:  [18] 18 (10/14 0807) BP: (120)/(63) 120/63 (10/14 0807) SpO2:  [96 %] 96 % (10/14 0807) Weight:  [110.9 kg] 110.9 kg (10/14 0848)  General Appearance: Alert, cooperative, no distress, appears stated age Head: Normocephalic, without obvious abnormality, atraumatic Eyes: PERRL, conjunctiva/corneas clear, EOM's intact    Neck: Supple, symmetrical, trachea midline Back: Symmetric, no curvature, ROM normal, no CVA tenderness Lungs:  respirations unlabored Heart: Regular rate and rhythm Abdomen: Soft, non-tender Extremities: Extremities normal, atraumatic, no cyanosis or edema Pulses: 2+ and symmetric all extremities Skin: Skin color, texture, turgor normal, no rashes or lesions  NEUROLOGIC:   Mental status: Alert and oriented x4,  no aphasia, good attention span, fund of knowledge, and memory Motor Exam - grossly normal Sensory Exam - grossly  normal Reflexes: 1+ Coordination - grossly normal Gait - grossly normal Balance - grossly normal Cranial Nerves: I: smell Not tested  II: visual acuity  OS: nl    OD: nl  II: visual fields Full to confrontation  II: pupils Equal, round, reactive to light  III,VII: ptosis None  III,IV,VI: extraocular muscles  Full ROM  V: mastication Normal  V: facial light touch sensation  Normal  V,VII: corneal reflex  Present  VII: facial muscle function - upper  Normal  VII: facial muscle function - lower Normal  VIII: hearing Not tested  IX: soft palate elevation  Normal  IX,X: gag reflex Present  XI: trapezius strength  5/5  XI: sternocleidomastoid strength 5/5  XI: neck flexion strength  5/5  XII: tongue strength  Normal    Data Review Lab Results  Component Value Date   WBC 8.5 04/06/2019   HGB 15.9 (H) 04/06/2019   HCT 47.7 (H) 04/06/2019   MCV 87.8 04/06/2019   PLT 241 04/06/2019   Lab Results  Component Value Date   NA 139 04/06/2019   K 4.1 04/06/2019   CL 105 04/06/2019   CO2 25 04/06/2019   BUN 8 04/06/2019   CREATININE 0.72 04/06/2019   GLUCOSE 99 04/06/2019   Lab Results  Component Value Date   INR 1.0 04/06/2019    Assessment/Plan:  Estimated body mass index is 37.71 kg/m as calculated from the following:   Height as of this encounter: 5' 7.5" (1.715 m).   Weight as of this encounter: 110.9 kg. Patient admitted for PLIF L4-5 L5-S1. Patient has failed a reasonable attempt at conservative therapy.  I explained the condition and procedure to the patient and answered any questions.  Patient wishes to proceed with procedure as planned. Understands risks/ benefits and typical outcomes of procedure.   Eustace Moore 04/12/2019 9:18 AM

## 2019-04-12 NOTE — Progress Notes (Signed)
Orthopedic Tech Progress Note Patient Details:  [name] 1970-09-25 [id number]  Patient ID: [name], female   DOB: 1971-05-28, 48 y.o.   MRN: [id number]   [name] 04/12/2019, 3:01 PMCalled [employer] for [employer].

## 2019-04-12 NOTE — Op Note (Signed)
04/12/2019  2:40 PM  PATIENT:  [name]  48 y.o. 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 Decompressive lumbar laminectomy L4-5 and L5-S1 requiring more work than would be required for a simple exposure of the disk for PLIF in order to adequately decompress the neural elements and address the spinal stenosis 2. Posterior lumbar interbody fusion L4-5 and L5-S1 using porous titanium interbody cages packed with morcellized allograft and autograft soaked with bone marrow aspirate obtained through a separate fascial incision over the right iliac crest 3. Posterior fixation L4-S1 using Alphatec cortical pedicle screws.  4. Intertransverse arthrodesis L4-S1 using morcellized autograft and allograft.  SURGEON:  Sherley Bounds, MD  ASSISTANTS: Glenford Peers, FNP  ANESTHESIA:  General  EBL: 600 ml  Total I/O In: 2300 [I.V.:2300] Out: 1200 [Urine:600; Blood:600]  BLOOD ADMINISTERED:none  DRAINS: none   INDICATION FOR PROCEDURE: This patient presented with unrelenting severe back and right leg pain. Imaging revealed recurrent stenosis and disc herniation L4-5 L5-S1 right where she had had previous surgery. The patient tried a reasonable attempt at conservative medical measures without relief. I recommended decompression and instrumented fusion to address the stenosis as well as the segmental  instability.  Patient understood the risks, benefits, and alternatives and potential outcomes and wished to proceed.  PROCEDURE DETAILS:  The patient was brought to the operating room. After induction of generalized endotracheal anesthesia the patient was rolled into the prone position on chest rolls and all pressure points were padded. The patient's lumbar region was cleaned and then prepped  with DuraPrep and draped in the usual sterile fashion. Anesthesia was injected and then a dorsal midline incision was made and carried down to the lumbosacral fascia. The fascia was opened and the paraspinous musculature was taken down in a subperiosteal fashion to expose L4-5 and L5-S1. A self-retaining retractor was placed. Intraoperative fluoroscopy confirmed my level, and I started with placement of the L4 cortical pedicle screws. The pedicle screw entry zones were identified utilizing surface landmarks and  AP and lateral fluoroscopy. I scored the cortex with the high-speed drill and then used the hand drill to drill an upward and outward direction into the pedicle. I then tapped line to line. I then placed a 5.5 x 40 mm cortical pedicle screw into the pedicles of L4 bilaterally.  My nurse practitioner then dissected in a suprafascial plane to expose the iliac crest.  Open the fascia used a Jamshidi needle to extract 60 cc of bone marrow aspirate from the iliac crest.  This was then spun down by [facility] device and 2 to 4 cc of  BMAC was soaked on morselized allograft for later arthrodesis.  I dried the hole with Surgifoam and closed the fascia.  I then turned my attention to the decompression and complete lumbar laminectomies, hemi- facetectomies, and foraminotomies were performed at L4-5 and L5-S1.  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  Great care was taken to separate the scar from the surrounding bony surfaces and great care was taken to decompress the L4-L5 and S1 nerve roots.  At no time did we see spinal fluid.  My nurse practitioner carefully did her side under my direct supervision and help and I did my side.  The patient had significant spinal stenosis and this required more work than  would be required for a simple exposure of the disc for posterior lumbar interbody fusion which would only require a limited laminotomy. Much more generous decompression and generous  foraminotomy was undertaken in order to adequately decompress the neural elements and address the patient's leg pain. The yellow ligament was removed to expose the underlying dura and nerve roots, and generous foraminotomies were performed to adequately decompress the neural elements. Both the exiting and traversing nerve roots were decompressed on both sides until a coronary dilator passed easily along the nerve roots. Once the decompression was complete, I turned my attention to the posterior lower lumbar interbody fusion. The epidural venous vasculature was coagulated and cut sharply. Disc space was incised and the initial discectomy was performed with pituitary rongeurs. The disc space was distracted with sequential distractors to a height of 7 mm at L4-5 and 9 mm at L5-S1. We then used a series of scrapers and shavers to prepare the endplates for fusion. The midline was prepared with Epstein curettes. Once the complete discectomy was finished, we packed an appropriate sized interbody cage with local autograft and morcellized allograft, gently retracted the nerve root, and tapped the cage into position at L4-5 and L5-S1.  The midline between the cages was packed with morselized autograft and allograft. We then turned our attention to the placement of the lower pedicle screws. The pedicle screw entry zones were identified utilizing surface landmarks and fluoroscopy. I drilled into each pedicle utilizing the hand drill, and tapped each pedicle with the appropriate tap. We palpated with a ball probe to assure no break in the cortex. We then placed 5.5 x 40 mm pedicle screws into L5 and 6.5 x 40 mm pedicle screws into the pedicles bilaterally at S1. We then decorticated the transverse processes and laid a mixture of morcellized autograft and allograft out over these to perform intertransverse arthrodesis at L4-S1. We then placed lordotic rods into the multiaxial screw heads of the pedicle screws and locked these in  position with the locking caps and anti-torque device. We then checked our construct with AP and lateral fluoroscopy. Irrigated with copious amounts of bacitracin-containing saline solution. Inspected the nerve roots once again to assure adequate decompression, lined to the dura with Gelfoam,  and closed the muscle and the fascia with 0 Vicryl. Closed the subcutaneous tissues with 2-0 Vicryl and subcuticular tissues with 3-0 Vicryl. The skin was closed with benzoin and Steri-Strips. Dressing was then applied, the patient was awakened from general anesthesia and transported to the recovery room in stable condition. At the end of the procedure all sponge, needle and instrument counts were correct.   PLAN OF CARE: admit to inpatient  PATIENT DISPOSITION:  PACU - hemodynamically stable.   Delay start of Pharmacological VTE agent (>24hrs) due to surgical blood loss or risk of bleeding:  yes

## 2019-04-12 NOTE — Anesthesia Procedure Notes (Addendum)
Procedure Name: Intubation Date/Time: 04/12/2019 9:47 AM Performed by: Babs Bertin, CRNA Pre-anesthesia Checklist: Patient identified, Emergency Drugs available, Suction available and Patient being monitored Patient Re-evaluated:Patient Re-evaluated prior to induction Oxygen Delivery Method: Circle System Utilized Preoxygenation: Pre-oxygenation with 100% oxygen Induction Type: IV induction Ventilation: Mask ventilation without difficulty Laryngoscope Size: Glidescope (unable to view cords with MAC blade, grade one view with glidescope) Tube type: Oral Tube size: 7.0 mm Number of attempts: 2 Airway Equipment and Method: Stylet and Oral airway Placement Confirmation: ETT inserted through vocal cords under direct vision,  positive ETCO2 and breath sounds checked- equal and bilateral Secured at: 22 cm Tube secured with: Tape Dental Injury: Teeth and Oropharynx as per pre-operative assessment

## 2019-04-13 MED ORDER — METHOCARBAMOL 500 MG PO TABS: 500.0000 mg | ORAL_TABLET | Freq: Three times a day (TID) | ORAL | 1 refills | Status: DC | PRN

## 2019-04-13 MED ORDER — MELOXICAM 15 MG PO TABS: 15.0000 mg | ORAL_TABLET | Freq: Every day | ORAL | 0 refills | Status: AC

## 2019-04-13 MED ORDER — HYDROMORPHONE HCL 4 MG PO TABS: 4.0000 mg | ORAL_TABLET | Freq: Four times a day (QID) | ORAL | 0 refills | Status: DC | PRN

## 2019-04-13 NOTE — Discharge Summary (Signed)
Physician Discharge Summary  Patient ID: [name] MRN: [id number] DOB/AGE: 09-15-1970 48 y.o.  Admit date: 04/12/2019 Discharge date: 04/13/2019  Admission Diagnoses: Recurrent disc herniation, degenerative disc disease, back and leg pain    Discharge Diagnoses: Same   Discharged Condition: good  Hospital Course: The patient was admitted on 04/12/2019 and taken to the operating room where the patient underwent lumbar interbody fusion L4-5 L5-S1. The patient tolerated the procedure well and was taken to the recovery room and then to the floor in stable condition. The hospital course was routine. There were no complications. The wound remained clean dry and intact. Pt had appropriate back soreness. No complaints of leg pain or new N/T/W. The patient remained afebrile with stable vital signs, and tolerated a regular diet. The patient continued to increase activities, and pain was well controlled with oral pain medications.   Consults: None  Significant Diagnostic Studies:  Results for orders placed or performed during the hospital encounter of 04/08/19  Novel Coronavirus, NAA (Hosp order, Send-out to Ref Lab; TAT 18-24 hrs   Specimen: Nasopharyngeal Swab; Respiratory  Result Value Ref Range   SARS-CoV-2, NAA NOT DETECTED NOT DETECTED   Coronavirus Source NASOPHARYNGEAL     Chest 2 View  Result Date: 04/06/2019 CLINICAL DATA:  Preoperative chest radiograph prior to lumbar spine surgery. EXAM: CHEST - 2 VIEW COMPARISON:  09/09/2018 radiographs FINDINGS: The cardiomediastinal silhouette is unremarkable. Mild chronic peribronchial thickening again noted. There is no evidence of focal airspace disease, pulmonary edema, suspicious pulmonary nodule/mass, pleural effusion, or pneumothorax. No acute bony abnormalities are identified. Cervical fusion changes again noted. IMPRESSION: No active cardiopulmonary disease. Electronically Signed   By: Margarette Canada M.D.   On: 04/06/2019 16:58    Dg Lumbar Spine 2-3 Views  Result Date: 04/12/2019 CLINICAL DATA:  Intraoperative radiographs of the lumbar spine during posterior lumbar fusion EXAM: LUMBAR SPINE - 2-3 VIEW COMPARISON:  MR lumbar spine dated 11/30/2018 FINDINGS: Intraoperative radiographs demonstrate pedicle screws with vertical connecting rods from L4-S1 with interbody spacers at L4-5 and L5-S1. The hardware appears intact. IMPRESSION: Intraoperative radiographs during L4-S1 fusion. Electronically Signed   By: Zerita Boers M.D.   On: 04/12/2019 16:01   Dg C-arm 1-60 Min  Result Date: 04/12/2019 CLINICAL DATA:  Posterior lumbar interbody fusion, intraoperative radiographs EXAM: DG C-ARM 1-60 MIN CONTRAST:  None FLUOROSCOPY TIME:  Fluoroscopy Time:  1 minutes 37 seconds Radiation Exposure Index (if provided by the fluoroscopic device): Not applicable Number of Acquired Spot Images: 0 COMPARISON:  MR lumbar spine dated 06/03 2020 FINDINGS: Intraoperative radiographs demonstrate pedicle screws with vertical connecting rods from L4-S1 with interbody spacers at L4-5 and L5-S1. The hardware appears intact. IMPRESSION: Intraoperative radiographs during L4-S1 fusion. Electronically Signed   By: Zerita Boers M.D.   On: 04/12/2019 16:00    Antibiotics:  Anti-infectives (From admission, onward)   Start     Dose/Rate Route Frequency Ordered Stop   04/12/19 1800  ceFAZolin (ANCEF) IVPB 2g/100 mL premix     2 g 200 mL/hr over 30 Minutes Intravenous Every 8 hours 04/12/19 1621 04/13/19 0140   04/12/19 1038  bacitracin 50,000 Units in sodium chloride 0.9 % 500 mL irrigation  Status:  Discontinued       As needed 04/12/19 1040 04/12/19 1446   04/12/19 0900  ceFAZolin (ANCEF) IVPB 2g/100 mL premix     2 g 200 mL/hr over 30 Minutes Intravenous On call to O.R. 04/12/19 [id number] 04/12/19 [id number]   04/12/19 0825  ceFAZolin (ANCEF) 2-4 GM/100ML-% IVPB    Note to Pharmacy: Poling, Tammy   : cabinet override      04/12/19 0825 04/12/19 0949       Discharge Exam: Blood pressure 122/89, pulse 87, temperature 98.1 F (36.7 C), temperature source Oral, resp. rate 18, height 5' 7.5" (1.715 m), weight 110.9 kg, SpO2 97 %. Neurologic: Grossly normal Dressing clean dry and intact  Discharge Medications:   Allergies as of 04/13/2019      Reactions   Shellfish Allergy Hives   SHRIMP    Conjugated Estrogens Other (See Comments)   progesterone interacted with antidepressants   Lactose Intolerance (gi) Diarrhea   GI Distress      Medication List    STOP taking these medications   oxyCODONE 15 MG immediate release tablet Commonly known as: ROXICODONE   tiZANidine 4 MG tablet Commonly known as: ZANAFLEX     TAKE these medications   albuterol 108 (90 Base) MCG/ACT inhaler Commonly known as: VENTOLIN HFA Inhale 1-2 puffs into the lungs every 6 (six) hours as needed for wheezing or shortness of breath.   amphetamine-dextroamphetamine 20 MG tablet Commonly known as: ADDERALL Take 20 mg by mouth 2 (two) times daily.   b complex vitamins tablet Take 1 tablet by mouth daily.   Belsomra 20 MG Tabs Generic drug: Suvorexant Take 20 mg by mouth at bedtime.   clonazePAM 2 MG tablet Commonly known as: KLONOPIN Take 4 mg by mouth at bedtime.   doxepin 50 MG capsule Commonly known as: SINEQUAN Take 100 mg by mouth at bedtime.   DULoxetine 60 MG capsule Commonly known as: CYMBALTA Take 60 mg by mouth daily.   estradiol 1 MG tablet Commonly known as: ESTRACE Take 1 mg by mouth daily.   famotidine 20 MG tablet Commonly known as: PEPCID Take 40 mg by mouth daily.   Ferrex 150 150 MG capsule Generic drug: iron polysaccharides Take 150 mg by mouth daily.   fluticasone 50 MCG/ACT nasal spray Commonly known as: FLONASE Place 2 sprays into both nostrils at bedtime.   gabapentin 600 MG tablet Commonly known as: NEURONTIN Take 2,400 mg by mouth at bedtime.   HYDROmorphone 4 MG tablet Commonly known as: DILAUDID Take  1 tablet (4 mg total) by mouth every 6 (six) hours as needed for severe pain.   levothyroxine 175 MCG tablet Commonly known as: SYNTHROID Take 175 mcg by mouth daily before breakfast.   lidocaine 5 % Commonly known as: LIDODERM Place 3 patches onto the skin daily as needed (pain).   Melatonin 10 MG Tabs Take 10 mg by mouth at bedtime.   meloxicam 15 MG tablet Commonly known as: MOBIC Take 1 tablet (15 mg total) by mouth daily for 5 days.   methocarbamol 500 MG tablet Commonly known as: ROBAXIN Take 1 tablet (500 mg total) by mouth every 8 (eight) hours as needed for muscle spasms.   multivitamin with minerals Tabs tablet Take 1 tablet by mouth daily.   ondansetron 4 MG disintegrating tablet Commonly known as: ZOFRAN-ODT Take 4 mg by mouth every 8 (eight) hours as needed for nausea or vomiting.   phentermine 37.5 MG tablet Commonly known as: ADIPEX-P Take 37.5 mg by mouth daily before breakfast.   Rexulti 3 MG Tabs Generic drug: Brexpiprazole Take 3 mg by mouth daily.   Soolantra 1 % Crea Generic drug: Ivermectin Apply 1 application topically every evening.   valACYclovir 1000 MG tablet Commonly known as: VALTREX Take 2,000 mg  by mouth 2 (two) times daily as needed (outbreaks).   Viibryd 40 MG Tabs Generic drug: Vilazodone HCl Take 40 mg by mouth daily.   Vitamin D-3 125 MCG (5000 UT) Tabs Take 5,000 Units by mouth daily.            Durable Medical Equipment  (From admission, onward)         Start     Ordered   04/12/19 1621  DME Walker rolling  Once    Question:  Patient needs a walker to treat with the following condition  Answer:  S/P lumbar fusion   04/12/19 1621   04/12/19 1621  DME 3 n 1  Once     04/12/19 1621          Disposition: Home   Final Dx: plif L4-5 L5-S1  Discharge Instructions     Remove dressing in 72 hours   Complete by: As directed    Call MD for:  difficulty breathing, headache or visual disturbances   Complete by: As  directed    Call MD for:  persistant nausea and vomiting   Complete by: As directed    Call MD for:  redness, tenderness, or signs of infection (pain, swelling, redness, odor or green/yellow discharge around incision site)   Complete by: As directed    Call MD for:  severe uncontrolled pain   Complete by: As directed    Call MD for:  temperature >100.4   Complete by: As directed    Diet - low sodium heart healthy   Complete by: As directed    Increase activity slowly   Complete by: As directed          Signed: Eustace Moore 04/13/2019, 8:15 AM

## 2019-04-13 NOTE — Evaluation (Signed)
Physical Therapy Evaluation and Discharge Patient Details Name: [name] MRN: [id number] DOB: [birth year] Today's Date: 04/13/2019   History of Present Illness  48 yo s/p L4-5 S1 PLIF. PMH of multiple spinal surgeries; fibromyalgia; ADD. See chart for extensive history.   Clinical Impression  Patient evaluated by Physical Therapy with no further acute PT needs identified. All education has been completed and the patient has no further questions. Pt was able to demonstrate transfers and ambulation with gross modified independence and no AD. Pt was educated on precautions, brace application/wearing schedule, appropriate activity progression, and car transfer. See below for any follow-up Physical Therapy or equipment needs. PT is signing off. Thank you for this referral.     Follow Up Recommendations No PT follow up;Supervision for mobility/OOB    Equipment Recommendations  3in1 (PT)    Recommendations for Other Services       Precautions / Restrictions Precautions Precautions: Back Precaution Booklet Issued: Yes (comment) Required Braces or Orthoses: Spinal Brace Spinal Brace: Lumbar corset Restrictions Weight Bearing Restrictions: No      Mobility  Bed Mobility               General bed mobility comments: Pt received ambulating in the hall without an AD  Transfers Overall transfer level: Modified independent Equipment used: None             General transfer comment: VC's for improved posture with sit<>stand. Able to complete without assist  Ambulation/Gait Ambulation/Gait assistance: Modified independent (Device/Increase time) Gait Distance (Feet): 250 Feet Assistive device: None Gait Pattern/deviations: Step-through pattern;Decreased stride length Gait velocity: Decreased Gait velocity interpretation: 1.31 - 2.62 ft/sec, indicative of limited community ambulator General Gait Details: Slow but generally steady without AD.   Stairs             Wheelchair Mobility    Modified Rankin (Stroke Patients Only)       Balance Overall balance assessment: No apparent balance deficits (not formally assessed)                                           Pertinent Vitals/Pain Pain Assessment: Faces Faces Pain Scale: Hurts little more Pain Location: buttocks Pain Descriptors / Indicators: Aching Pain Intervention(s): Limited activity within patient's tolerance;Monitored during session;Repositioned    Home Living Family/patient expects to be discharged to:: Private residence Living Arrangements: Spouse/significant other Available Help at Discharge: Family;Available 24 hours/day Type of Home: House Home Access: Level entry(1 threshold step through the door)     Home Layout: One level Home Equipment: None      Prior Function Level of Independence: Needs assistance   Gait / Transfers Assistance Needed: independent  ADL's / Homemaking Assistance Needed: states husband was assisting with LB ADL 2 pain        Hand Dominance   Dominant Hand: Right    Extremity/Trunk Assessment   Upper Extremity Assessment Upper Extremity Assessment: Overall WFL for tasks assessed    Lower Extremity Assessment Lower Extremity Assessment: Overall WFL for tasks assessed    Cervical / Trunk Assessment Cervical / Trunk Assessment: Other exceptions(multiple back surgeries)  Communication   Communication: No difficulties  Cognition Arousal/Alertness: Awake/alert Behavior During Therapy: WFL for tasks assessed/performed(difficulty attending to task; redirectional cues) Overall Cognitive Status: Within Functional Limits for tasks assessed  General Comments      Exercises     Assessment/Plan    PT Assessment Patent does not need any further PT services  PT Problem List         PT Treatment Interventions      PT Goals (Current goals can be found in  the Care Plan section)  Acute Rehab PT Goals Patient Stated Goal: to not have amymore surgeries PT Goal Formulation: All assessment and education complete, DC therapy    Frequency     Barriers to discharge        Co-evaluation               AM-PAC PT "6 Clicks" Mobility  Outcome Measure Help needed turning from your back to your side while in a flat bed without using bedrails?: None Help needed moving from lying on your back to sitting on the side of a flat bed without using bedrails?: None Help needed moving to and from a bed to a chair (including a wheelchair)?: None Help needed standing up from a chair using your arms (e.g., wheelchair or bedside chair)?: None Help needed to walk in hospital room?: None Help needed climbing 3-5 steps with a railing? : None 6 Click Score: 24    End of Session Equipment Utilized During Treatment: Back brace Activity Tolerance: Patient tolerated treatment well Patient left: in chair;with call bell/phone within reach Nurse Communication: Mobility status PT Visit Diagnosis: Unsteadiness on feet (R26.81);Pain Pain - part of body: (back)    Time: [id number] PT Time Calculation (min) (ACUTE ONLY): 21 min   Charges:   PT Evaluation $PT Eval Low Complexity: 1 Low          Rolinda Roan, PT, DPT Acute Rehabilitation Services Pager: [phone] Office: [phone]   Thelma Comp 04/13/2019, 11:44 AM

## 2019-04-13 NOTE — Discharge Instructions (Signed)

## 2019-04-13 NOTE — Anesthesia Postprocedure Evaluation (Signed)
Anesthesia Post Note  Patient: [name]  Procedure(s) Performed: Posterio Lumbar Interbody Fusion - Lumbar Four - Lumbar Five - Lumbar Five - Sacral One (N/A Back)     Patient location during evaluation: PACU Anesthesia Type: General Level of consciousness: awake and alert and awake Pain management: pain level controlled Vital Signs Assessment: post-procedure vital signs reviewed and stable Respiratory status: spontaneous breathing, nonlabored ventilation and respiratory function stable Cardiovascular status: blood pressure returned to baseline and stable Postop Assessment: no apparent nausea or vomiting Anesthetic complications: no    Last Vitals:  Vitals:   04/13/19 0414 04/13/19 0726  BP: (!) 147/76 122/89  Pulse: (!) 107 87  Resp: 20 18  Temp: 37.2 C 36.7 C  SpO2: 94% 97%    Last Pain:  Vitals:   04/13/19 0845  TempSrc:   PainSc: 4                  Catalina Gravel

## 2019-04-13 NOTE — Plan of Care (Signed)
Pt doing well. Pt and husband given D/C instructions with verbal understanding. Pt's incision is clean and dry with no sign of infection. Pt's IV was removed prior to D/C. Pt received 3-n-1 from [employer] per MD order. Pt D/C'd home via wheelchair @ 0945 per MD order. Pt is stable @ D/C and has no other needs at this time. Holli Humbles, RN

## 2019-04-13 NOTE — Progress Notes (Signed)
Occupational Therapy Evaluation Patient Details Name: [name] MRN: [id number] DOB: [birth year] Today's Date: 04/13/2019    History of Present Illness 48 yo s/p L4-5 S1 PLIF. PMH of multiple spinal surgeries; fibromyalgia; ADD. See chart for extensive history.    Clinical Impression   Completed all education regarding back precautions for ADL and functional mobility for ADL with use of DME and AE. Pt able to return demonstrate. Pt will need 3in1 for DC home. No further OT needs.     Follow Up Recommendations  No OT follow up;Supervision - Intermittent    Equipment Recommendations  3 in 1 bedside commode    Recommendations for Other Services       Precautions / Restrictions Precautions Precautions: Back Precaution Booklet Issued: Yes (comment) Required Braces or Orthoses: Spinal Brace Spinal Brace: Lumbar corset Restrictions Weight Bearing Restrictions: No      Mobility Bed Mobility               General bed mobility comments: OOB in chair  Transfers Overall transfer level: Modified independent               General transfer comment: cues to not bend to retureve items    Balance Overall balance assessment: No apparent balance deficits (not formally assessed)                                         ADL either performed or assessed with clinical judgement   ADL Overall ADL's : Needs assistance/impaired                                     Functional mobility during ADLs: Modified independent General ADL Comments: Educated pt on compensatory techniques for ADL; Pt ablet o complete figure four position with minimal difficulty. Educated on recommendation for reacher to assist with ADL adn IADL tasks. Recommend pt using long handled sponge for bathing and toilet tong for pericare due to difficulty reaching without twisting. Discussed different options for shaving legs as pt very concerned.      Vision          Perception     Praxis      Pertinent Vitals/Pain Pain Assessment: Faces Faces Pain Scale: Hurts little more Pain Location: buttocks Pain Descriptors / Indicators: Aching Pain Intervention(s): Limited activity within patient's tolerance     Hand Dominance Right   Extremity/Trunk Assessment Upper Extremity Assessment Upper Extremity Assessment: Overall WFL for tasks assessed   Lower Extremity Assessment Lower Extremity Assessment: Defer to PT evaluation   Cervical / Trunk Assessment Cervical / Trunk Assessment: Other exceptions(multiple back surgeries)   Communication Communication Communication: No difficulties   Cognition Arousal/Alertness: Awake/alert Behavior During Therapy: WFL for tasks assessed/performed(difficulty attending to task; redirectional cues) Overall Cognitive Status: Within Functional Limits for tasks assessed                                     General Comments       Exercises     Shoulder Instructions      Home Living Family/patient expects to be discharged to:: Private residence Living Arrangements: Spouse/significant other Available Help at Discharge: Family;Available 24 hours/day Type of Home: House  Bathroom Shower/Tub: Advertising copywriter: Yes How Accessible: Accessible via walker Home Equipment: None          Prior Functioning/Environment Level of Independence: Needs assistance  Gait / Transfers Assistance Needed: independent ADL's / Homemaking Assistance Needed: states husband was assisting with LB  Communication / Swallowing Assistance Needed: ADL due to pain          OT Problem List: Decreased strength;Decreased safety awareness;Decreased knowledge of use of DME or AE;Decreased knowledge of precautions;Obesity;Pain      OT Treatment/Interventions:      OT Goals(Current goals can be found in the care plan section) Acute Rehab OT  Goals Patient Stated Goal: to not have amymore surgeries OT Goal Formulation: All assessment and education complete, DC therapy  OT Frequency:     Barriers to D/C:            Co-evaluation              AM-PAC OT "6 Clicks" Daily Activity     Outcome Measure Help from another person eating meals?: None Help from another person taking care of personal grooming?: A Little Help from another person toileting, which includes using toliet, bedpan, or urinal?: A Little Help from another person bathing (including washing, rinsing, drying)?: A Little Help from another person to put on and taking off regular upper body clothing?: None Help from another person to put on and taking off regular lower body clothing?: A Little 6 Click Score: 20   End of Session Equipment Utilized During Treatment: Back brace  Activity Tolerance: Patient tolerated treatment well Patient left: Other (comment)(walking in room)  OT Visit Diagnosis: Muscle weakness (generalized) (M62.81);Pain Pain - part of body: (buttocks)                Time: [id number] OT Time Calculation (min): 15 min Charges:  OT General Charges $OT Visit: 1 Visit OT Evaluation $OT Eval Low Complexity: [place], OT/L   Acute OT Clinical Specialist [place] Pager [phone] Office [phone]   [facility] 04/13/2019, 9:03 AM

## 2019-04-25 DIAGNOSIS — G4733 Obstructive sleep apnea (adult) (pediatric): Secondary | ICD-10-CM | POA: Diagnosis not present

## 2019-05-11 DIAGNOSIS — M5416 Radiculopathy, lumbar region: Secondary | ICD-10-CM | POA: Diagnosis not present

## 2019-05-23 DIAGNOSIS — F332 Major depressive disorder, recurrent severe without psychotic features: Secondary | ICD-10-CM | POA: Diagnosis not present

## 2019-05-23 DIAGNOSIS — F4321 Adjustment disorder with depressed mood: Secondary | ICD-10-CM | POA: Diagnosis not present

## 2019-06-01 DIAGNOSIS — F4322 Adjustment disorder with anxiety: Secondary | ICD-10-CM | POA: Diagnosis not present

## 2019-06-01 DIAGNOSIS — F3342 Major depressive disorder, recurrent, in full remission: Secondary | ICD-10-CM | POA: Diagnosis not present

## 2019-06-06 DIAGNOSIS — M542 Cervicalgia: Secondary | ICD-10-CM | POA: Diagnosis not present

## 2019-06-06 DIAGNOSIS — R030 Elevated blood-pressure reading, without diagnosis of hypertension: Secondary | ICD-10-CM | POA: Diagnosis not present

## 2019-06-06 DIAGNOSIS — R519 Headache, unspecified: Secondary | ICD-10-CM | POA: Diagnosis not present

## 2019-06-08 DIAGNOSIS — M5416 Radiculopathy, lumbar region: Secondary | ICD-10-CM | POA: Diagnosis not present

## 2019-06-14 DIAGNOSIS — F331 Major depressive disorder, recurrent, moderate: Secondary | ICD-10-CM | POA: Diagnosis not present

## 2019-06-15 DIAGNOSIS — M62838 Other muscle spasm: Secondary | ICD-10-CM | POA: Diagnosis not present

## 2019-06-15 DIAGNOSIS — H6012 Cellulitis of left external ear: Secondary | ICD-10-CM | POA: Diagnosis not present

## 2019-06-15 DIAGNOSIS — K219 Gastro-esophageal reflux disease without esophagitis: Secondary | ICD-10-CM | POA: Diagnosis not present

## 2019-06-20 DIAGNOSIS — M542 Cervicalgia: Secondary | ICD-10-CM | POA: Diagnosis not present

## 2019-06-20 DIAGNOSIS — M5413 Radiculopathy, cervicothoracic region: Secondary | ICD-10-CM | POA: Diagnosis not present

## 2019-07-05 DIAGNOSIS — F3342 Major depressive disorder, recurrent, in full remission: Secondary | ICD-10-CM | POA: Diagnosis not present

## 2019-07-05 DIAGNOSIS — F411 Generalized anxiety disorder: Secondary | ICD-10-CM | POA: Diagnosis not present

## 2019-07-05 DIAGNOSIS — F410 Panic disorder [episodic paroxysmal anxiety] without agoraphobia: Secondary | ICD-10-CM | POA: Diagnosis not present

## 2019-07-14 DIAGNOSIS — F410 Panic disorder [episodic paroxysmal anxiety] without agoraphobia: Secondary | ICD-10-CM | POA: Diagnosis not present

## 2019-07-14 DIAGNOSIS — F411 Generalized anxiety disorder: Secondary | ICD-10-CM | POA: Diagnosis not present

## 2019-07-14 DIAGNOSIS — F331 Major depressive disorder, recurrent, moderate: Secondary | ICD-10-CM | POA: Diagnosis not present

## 2019-07-19 DIAGNOSIS — F4322 Adjustment disorder with anxiety: Secondary | ICD-10-CM | POA: Diagnosis not present

## 2019-07-19 DIAGNOSIS — F410 Panic disorder [episodic paroxysmal anxiety] without agoraphobia: Secondary | ICD-10-CM | POA: Diagnosis not present

## 2019-07-19 DIAGNOSIS — F3342 Major depressive disorder, recurrent, in full remission: Secondary | ICD-10-CM | POA: Diagnosis not present

## 2019-07-25 DIAGNOSIS — G4733 Obstructive sleep apnea (adult) (pediatric): Secondary | ICD-10-CM | POA: Diagnosis not present

## 2019-07-26 DIAGNOSIS — F411 Generalized anxiety disorder: Secondary | ICD-10-CM | POA: Diagnosis not present

## 2019-07-26 DIAGNOSIS — F3342 Major depressive disorder, recurrent, in full remission: Secondary | ICD-10-CM | POA: Diagnosis not present

## 2019-07-26 DIAGNOSIS — F410 Panic disorder [episodic paroxysmal anxiety] without agoraphobia: Secondary | ICD-10-CM | POA: Diagnosis not present

## 2019-07-27 DIAGNOSIS — N951 Menopausal and female climacteric states: Secondary | ICD-10-CM | POA: Diagnosis not present

## 2019-07-27 DIAGNOSIS — R519 Headache, unspecified: Secondary | ICD-10-CM | POA: Diagnosis not present

## 2019-07-27 DIAGNOSIS — F419 Anxiety disorder, unspecified: Secondary | ICD-10-CM | POA: Diagnosis not present

## 2019-07-27 DIAGNOSIS — R030 Elevated blood-pressure reading, without diagnosis of hypertension: Secondary | ICD-10-CM | POA: Diagnosis not present

## 2019-08-01 DIAGNOSIS — F341 Dysthymic disorder: Secondary | ICD-10-CM | POA: Diagnosis not present

## 2019-08-01 DIAGNOSIS — F332 Major depressive disorder, recurrent severe without psychotic features: Secondary | ICD-10-CM | POA: Diagnosis not present

## 2019-08-07 DIAGNOSIS — F332 Major depressive disorder, recurrent severe without psychotic features: Secondary | ICD-10-CM | POA: Diagnosis not present

## 2019-08-10 DIAGNOSIS — F332 Major depressive disorder, recurrent severe without psychotic features: Secondary | ICD-10-CM | POA: Diagnosis not present

## 2019-08-15 DIAGNOSIS — F332 Major depressive disorder, recurrent severe without psychotic features: Secondary | ICD-10-CM | POA: Diagnosis not present

## 2019-08-16 DIAGNOSIS — E7800 Pure hypercholesterolemia, unspecified: Secondary | ICD-10-CM | POA: Diagnosis not present

## 2019-08-16 DIAGNOSIS — R109 Unspecified abdominal pain: Secondary | ICD-10-CM | POA: Diagnosis not present

## 2019-08-16 DIAGNOSIS — I10 Essential (primary) hypertension: Secondary | ICD-10-CM | POA: Diagnosis not present

## 2019-08-16 DIAGNOSIS — Z0000 Encounter for general adult medical examination without abnormal findings: Secondary | ICD-10-CM | POA: Diagnosis not present

## 2019-08-16 DIAGNOSIS — E039 Hypothyroidism, unspecified: Secondary | ICD-10-CM | POA: Diagnosis not present

## 2019-08-16 DIAGNOSIS — E559 Vitamin D deficiency, unspecified: Secondary | ICD-10-CM | POA: Diagnosis not present

## 2019-08-18 DIAGNOSIS — F332 Major depressive disorder, recurrent severe without psychotic features: Secondary | ICD-10-CM | POA: Diagnosis not present

## 2019-08-21 DIAGNOSIS — F332 Major depressive disorder, recurrent severe without psychotic features: Secondary | ICD-10-CM | POA: Diagnosis not present

## 2019-08-24 DIAGNOSIS — F332 Major depressive disorder, recurrent severe without psychotic features: Secondary | ICD-10-CM | POA: Diagnosis not present

## 2019-08-28 DIAGNOSIS — F332 Major depressive disorder, recurrent severe without psychotic features: Secondary | ICD-10-CM | POA: Diagnosis not present

## 2019-09-01 DIAGNOSIS — F332 Major depressive disorder, recurrent severe without psychotic features: Secondary | ICD-10-CM | POA: Diagnosis not present

## 2019-09-04 DIAGNOSIS — E039 Hypothyroidism, unspecified: Secondary | ICD-10-CM | POA: Diagnosis not present

## 2019-09-04 DIAGNOSIS — F332 Major depressive disorder, recurrent severe without psychotic features: Secondary | ICD-10-CM | POA: Diagnosis not present

## 2019-09-04 DIAGNOSIS — F411 Generalized anxiety disorder: Secondary | ICD-10-CM | POA: Diagnosis not present

## 2019-09-04 DIAGNOSIS — F419 Anxiety disorder, unspecified: Secondary | ICD-10-CM | POA: Diagnosis not present

## 2019-09-05 DIAGNOSIS — M546 Pain in thoracic spine: Secondary | ICD-10-CM | POA: Diagnosis not present

## 2019-09-05 DIAGNOSIS — M545 Low back pain: Secondary | ICD-10-CM | POA: Diagnosis not present

## 2019-09-06 DIAGNOSIS — F4322 Adjustment disorder with anxiety: Secondary | ICD-10-CM | POA: Diagnosis not present

## 2019-09-06 DIAGNOSIS — F331 Major depressive disorder, recurrent, moderate: Secondary | ICD-10-CM | POA: Diagnosis not present

## 2019-09-06 DIAGNOSIS — F410 Panic disorder [episodic paroxysmal anxiety] without agoraphobia: Secondary | ICD-10-CM | POA: Diagnosis not present

## 2019-09-08 DIAGNOSIS — F332 Major depressive disorder, recurrent severe without psychotic features: Secondary | ICD-10-CM | POA: Diagnosis not present

## 2019-09-13 DIAGNOSIS — F332 Major depressive disorder, recurrent severe without psychotic features: Secondary | ICD-10-CM | POA: Diagnosis not present

## 2019-09-14 DIAGNOSIS — F332 Major depressive disorder, recurrent severe without psychotic features: Secondary | ICD-10-CM | POA: Diagnosis not present

## 2019-09-20 DIAGNOSIS — F410 Panic disorder [episodic paroxysmal anxiety] without agoraphobia: Secondary | ICD-10-CM | POA: Diagnosis not present

## 2019-09-20 DIAGNOSIS — F341 Dysthymic disorder: Secondary | ICD-10-CM | POA: Diagnosis not present

## 2019-09-20 DIAGNOSIS — F331 Major depressive disorder, recurrent, moderate: Secondary | ICD-10-CM | POA: Diagnosis not present

## 2019-09-20 DIAGNOSIS — F900 Attention-deficit hyperactivity disorder, predominantly inattentive type: Secondary | ICD-10-CM | POA: Diagnosis not present

## 2019-09-27 DIAGNOSIS — F331 Major depressive disorder, recurrent, moderate: Secondary | ICD-10-CM | POA: Diagnosis not present

## 2019-09-27 DIAGNOSIS — F410 Panic disorder [episodic paroxysmal anxiety] without agoraphobia: Secondary | ICD-10-CM | POA: Diagnosis not present

## 2019-10-04 DIAGNOSIS — F410 Panic disorder [episodic paroxysmal anxiety] without agoraphobia: Secondary | ICD-10-CM | POA: Diagnosis not present

## 2019-10-04 DIAGNOSIS — F332 Major depressive disorder, recurrent severe without psychotic features: Secondary | ICD-10-CM | POA: Diagnosis not present

## 2019-10-04 DIAGNOSIS — F341 Dysthymic disorder: Secondary | ICD-10-CM | POA: Diagnosis not present

## 2019-10-06 DIAGNOSIS — Z124 Encounter for screening for malignant neoplasm of cervix: Secondary | ICD-10-CM | POA: Diagnosis not present

## 2019-10-06 DIAGNOSIS — Z1231 Encounter for screening mammogram for malignant neoplasm of breast: Secondary | ICD-10-CM | POA: Diagnosis not present

## 2019-10-06 DIAGNOSIS — Z1151 Encounter for screening for human papillomavirus (HPV): Secondary | ICD-10-CM | POA: Diagnosis not present

## 2019-10-06 DIAGNOSIS — Z01419 Encounter for gynecological examination (general) (routine) without abnormal findings: Secondary | ICD-10-CM | POA: Diagnosis not present

## 2019-10-06 DIAGNOSIS — Z130 Encounter for screening for diseases of the blood and blood-forming organs and certain disorders involving the immune mechanism: Secondary | ICD-10-CM | POA: Diagnosis not present

## 2019-10-06 DIAGNOSIS — Z6838 Body mass index (BMI) 38.0-38.9, adult: Secondary | ICD-10-CM | POA: Diagnosis not present

## 2019-10-17 DIAGNOSIS — M5410 Radiculopathy, site unspecified: Secondary | ICD-10-CM | POA: Diagnosis not present

## 2019-10-23 DIAGNOSIS — G4733 Obstructive sleep apnea (adult) (pediatric): Secondary | ICD-10-CM | POA: Diagnosis not present

## 2019-10-30 DIAGNOSIS — M961 Postlaminectomy syndrome, not elsewhere classified: Secondary | ICD-10-CM | POA: Diagnosis not present

## 2019-10-30 DIAGNOSIS — M546 Pain in thoracic spine: Secondary | ICD-10-CM | POA: Diagnosis not present

## 2019-10-30 DIAGNOSIS — M7918 Myalgia, other site: Secondary | ICD-10-CM | POA: Diagnosis not present

## 2019-10-30 DIAGNOSIS — M5124 Other intervertebral disc displacement, thoracic region: Secondary | ICD-10-CM | POA: Diagnosis not present

## 2019-11-07 DIAGNOSIS — F332 Major depressive disorder, recurrent severe without psychotic features: Secondary | ICD-10-CM | POA: Diagnosis not present

## 2019-11-10 DIAGNOSIS — K5900 Constipation, unspecified: Secondary | ICD-10-CM | POA: Diagnosis not present

## 2019-12-19 DIAGNOSIS — M542 Cervicalgia: Secondary | ICD-10-CM | POA: Diagnosis not present

## 2019-12-19 DIAGNOSIS — I10 Essential (primary) hypertension: Secondary | ICD-10-CM | POA: Diagnosis not present

## 2019-12-19 DIAGNOSIS — M545 Low back pain: Secondary | ICD-10-CM | POA: Diagnosis not present

## 2019-12-19 DIAGNOSIS — Z6837 Body mass index (BMI) 37.0-37.9, adult: Secondary | ICD-10-CM | POA: Diagnosis not present

## 2019-12-25 ENCOUNTER — Other Ambulatory Visit: Payer: Self-pay | Admitting: Neurological Surgery

## 2019-12-25 DIAGNOSIS — M542 Cervicalgia: Secondary | ICD-10-CM

## 2020-01-08 DIAGNOSIS — E7800 Pure hypercholesterolemia, unspecified: Secondary | ICD-10-CM | POA: Diagnosis not present

## 2020-01-08 DIAGNOSIS — K5900 Constipation, unspecified: Secondary | ICD-10-CM | POA: Diagnosis not present

## 2020-01-08 DIAGNOSIS — E039 Hypothyroidism, unspecified: Secondary | ICD-10-CM | POA: Diagnosis not present

## 2020-01-09 DIAGNOSIS — E039 Hypothyroidism, unspecified: Secondary | ICD-10-CM | POA: Diagnosis not present

## 2020-01-09 DIAGNOSIS — E7800 Pure hypercholesterolemia, unspecified: Secondary | ICD-10-CM | POA: Diagnosis not present

## 2020-01-11 ENCOUNTER — Other Ambulatory Visit: Payer: BC Managed Care – PPO

## 2020-01-15 ENCOUNTER — Other Ambulatory Visit: Payer: BC Managed Care – PPO

## 2020-01-15 ENCOUNTER — Ambulatory Visit
Admission: RE | Admit: 2020-01-15 | Discharge: 2020-01-15 | Disposition: A | Discharge status: Home / Self Care | Payer: BC Managed Care – PPO | Source: Ambulatory Visit | Attending: Neurological Surgery | Admitting: Neurological Surgery

## 2020-01-15 DIAGNOSIS — M2578 Osteophyte, vertebrae: Secondary | ICD-10-CM | POA: Diagnosis not present

## 2020-01-15 DIAGNOSIS — M542 Cervicalgia: Secondary | ICD-10-CM

## 2020-01-15 DIAGNOSIS — M4322 Fusion of spine, cervical region: Secondary | ICD-10-CM | POA: Diagnosis not present

## 2020-01-22 DIAGNOSIS — G4733 Obstructive sleep apnea (adult) (pediatric): Secondary | ICD-10-CM | POA: Diagnosis not present

## 2020-01-23 DIAGNOSIS — M546 Pain in thoracic spine: Secondary | ICD-10-CM | POA: Diagnosis not present

## 2020-01-23 DIAGNOSIS — Z6836 Body mass index (BMI) 36.0-36.9, adult: Secondary | ICD-10-CM | POA: Diagnosis not present

## 2020-01-29 ENCOUNTER — Other Ambulatory Visit: Payer: Self-pay | Admitting: Gastroenterology

## 2020-01-29 ENCOUNTER — Ambulatory Visit
Admission: RE | Admit: 2020-01-29 | Discharge: 2020-01-29 | Disposition: A | Discharge status: Home / Self Care | Payer: BC Managed Care – PPO | Source: Ambulatory Visit | Attending: Gastroenterology | Admitting: Gastroenterology

## 2020-01-29 DIAGNOSIS — K5900 Constipation, unspecified: Secondary | ICD-10-CM

## 2020-02-13 DIAGNOSIS — F332 Major depressive disorder, recurrent severe without psychotic features: Secondary | ICD-10-CM | POA: Diagnosis not present

## 2020-02-23 DIAGNOSIS — M5124 Other intervertebral disc displacement, thoracic region: Secondary | ICD-10-CM | POA: Diagnosis not present

## 2020-02-27 DIAGNOSIS — F332 Major depressive disorder, recurrent severe without psychotic features: Secondary | ICD-10-CM | POA: Diagnosis not present

## 2020-02-28 DIAGNOSIS — F332 Major depressive disorder, recurrent severe without psychotic features: Secondary | ICD-10-CM | POA: Diagnosis not present

## 2020-02-29 DIAGNOSIS — F332 Major depressive disorder, recurrent severe without psychotic features: Secondary | ICD-10-CM | POA: Diagnosis not present

## 2020-03-01 DIAGNOSIS — F332 Major depressive disorder, recurrent severe without psychotic features: Secondary | ICD-10-CM | POA: Diagnosis not present

## 2020-03-05 DIAGNOSIS — F332 Major depressive disorder, recurrent severe without psychotic features: Secondary | ICD-10-CM | POA: Diagnosis not present

## 2020-03-06 DIAGNOSIS — F332 Major depressive disorder, recurrent severe without psychotic features: Secondary | ICD-10-CM | POA: Diagnosis not present

## 2020-03-07 DIAGNOSIS — F332 Major depressive disorder, recurrent severe without psychotic features: Secondary | ICD-10-CM | POA: Diagnosis not present

## 2020-03-08 DIAGNOSIS — F332 Major depressive disorder, recurrent severe without psychotic features: Secondary | ICD-10-CM | POA: Diagnosis not present

## 2020-03-11 DIAGNOSIS — F332 Major depressive disorder, recurrent severe without psychotic features: Secondary | ICD-10-CM | POA: Diagnosis not present

## 2020-03-12 DIAGNOSIS — F332 Major depressive disorder, recurrent severe without psychotic features: Secondary | ICD-10-CM | POA: Diagnosis not present

## 2020-03-13 DIAGNOSIS — F332 Major depressive disorder, recurrent severe without psychotic features: Secondary | ICD-10-CM | POA: Diagnosis not present

## 2020-03-14 DIAGNOSIS — F332 Major depressive disorder, recurrent severe without psychotic features: Secondary | ICD-10-CM | POA: Diagnosis not present

## 2020-03-15 DIAGNOSIS — F332 Major depressive disorder, recurrent severe without psychotic features: Secondary | ICD-10-CM | POA: Diagnosis not present

## 2020-03-18 DIAGNOSIS — F332 Major depressive disorder, recurrent severe without psychotic features: Secondary | ICD-10-CM | POA: Diagnosis not present

## 2020-03-19 DIAGNOSIS — F332 Major depressive disorder, recurrent severe without psychotic features: Secondary | ICD-10-CM | POA: Diagnosis not present

## 2020-03-20 DIAGNOSIS — F332 Major depressive disorder, recurrent severe without psychotic features: Secondary | ICD-10-CM | POA: Diagnosis not present

## 2020-03-21 DIAGNOSIS — M5416 Radiculopathy, lumbar region: Secondary | ICD-10-CM | POA: Diagnosis not present

## 2020-03-21 DIAGNOSIS — F332 Major depressive disorder, recurrent severe without psychotic features: Secondary | ICD-10-CM | POA: Diagnosis not present

## 2020-03-22 DIAGNOSIS — F332 Major depressive disorder, recurrent severe without psychotic features: Secondary | ICD-10-CM | POA: Diagnosis not present

## 2020-03-25 DIAGNOSIS — F332 Major depressive disorder, recurrent severe without psychotic features: Secondary | ICD-10-CM | POA: Diagnosis not present

## 2020-03-26 DIAGNOSIS — F332 Major depressive disorder, recurrent severe without psychotic features: Secondary | ICD-10-CM | POA: Diagnosis not present

## 2020-03-27 DIAGNOSIS — F332 Major depressive disorder, recurrent severe without psychotic features: Secondary | ICD-10-CM | POA: Diagnosis not present

## 2020-03-28 DIAGNOSIS — F332 Major depressive disorder, recurrent severe without psychotic features: Secondary | ICD-10-CM | POA: Diagnosis not present

## 2020-03-29 DIAGNOSIS — F332 Major depressive disorder, recurrent severe without psychotic features: Secondary | ICD-10-CM | POA: Diagnosis not present

## 2020-04-01 DIAGNOSIS — F332 Major depressive disorder, recurrent severe without psychotic features: Secondary | ICD-10-CM | POA: Diagnosis not present

## 2020-04-02 DIAGNOSIS — F332 Major depressive disorder, recurrent severe without psychotic features: Secondary | ICD-10-CM | POA: Diagnosis not present

## 2020-04-03 DIAGNOSIS — F332 Major depressive disorder, recurrent severe without psychotic features: Secondary | ICD-10-CM | POA: Diagnosis not present

## 2020-04-04 DIAGNOSIS — F332 Major depressive disorder, recurrent severe without psychotic features: Secondary | ICD-10-CM | POA: Diagnosis not present

## 2020-04-05 DIAGNOSIS — F332 Major depressive disorder, recurrent severe without psychotic features: Secondary | ICD-10-CM | POA: Diagnosis not present

## 2020-04-08 DIAGNOSIS — F332 Major depressive disorder, recurrent severe without psychotic features: Secondary | ICD-10-CM | POA: Diagnosis not present

## 2020-04-09 ENCOUNTER — Other Ambulatory Visit: Payer: Self-pay | Admitting: Student

## 2020-04-09 DIAGNOSIS — E039 Hypothyroidism, unspecified: Secondary | ICD-10-CM | POA: Diagnosis not present

## 2020-04-09 DIAGNOSIS — M5416 Radiculopathy, lumbar region: Secondary | ICD-10-CM

## 2020-04-09 DIAGNOSIS — F332 Major depressive disorder, recurrent severe without psychotic features: Secondary | ICD-10-CM | POA: Diagnosis not present

## 2020-04-10 DIAGNOSIS — F332 Major depressive disorder, recurrent severe without psychotic features: Secondary | ICD-10-CM | POA: Diagnosis not present

## 2020-04-11 DIAGNOSIS — F332 Major depressive disorder, recurrent severe without psychotic features: Secondary | ICD-10-CM | POA: Diagnosis not present

## 2020-04-12 DIAGNOSIS — F332 Major depressive disorder, recurrent severe without psychotic features: Secondary | ICD-10-CM | POA: Diagnosis not present

## 2020-04-15 DIAGNOSIS — F332 Major depressive disorder, recurrent severe without psychotic features: Secondary | ICD-10-CM | POA: Diagnosis not present

## 2020-04-17 DIAGNOSIS — F332 Major depressive disorder, recurrent severe without psychotic features: Secondary | ICD-10-CM | POA: Diagnosis not present

## 2020-04-19 ENCOUNTER — Ambulatory Visit (INDEPENDENT_AMBULATORY_CARE_PROVIDER_SITE_OTHER): Payer: BC Managed Care – PPO | Admitting: Otolaryngology

## 2020-04-19 DIAGNOSIS — F332 Major depressive disorder, recurrent severe without psychotic features: Secondary | ICD-10-CM | POA: Diagnosis not present

## 2020-04-22 DIAGNOSIS — G4733 Obstructive sleep apnea (adult) (pediatric): Secondary | ICD-10-CM | POA: Diagnosis not present

## 2020-04-24 DIAGNOSIS — M25562 Pain in left knee: Secondary | ICD-10-CM | POA: Diagnosis not present

## 2020-04-24 DIAGNOSIS — M25462 Effusion, left knee: Secondary | ICD-10-CM | POA: Diagnosis not present

## 2020-04-26 ENCOUNTER — Ambulatory Visit (INDEPENDENT_AMBULATORY_CARE_PROVIDER_SITE_OTHER): Payer: BC Managed Care – PPO | Admitting: Otolaryngology

## 2020-04-30 ENCOUNTER — Other Ambulatory Visit: Payer: Self-pay

## 2020-04-30 ENCOUNTER — Ambulatory Visit
Admission: RE | Admit: 2020-04-30 | Discharge: 2020-04-30 | Disposition: A | Discharge status: Home / Self Care | Payer: BC Managed Care – PPO | Source: Ambulatory Visit | Attending: Student | Admitting: Student

## 2020-04-30 DIAGNOSIS — M4056 Lordosis, unspecified, lumbar region: Secondary | ICD-10-CM | POA: Diagnosis not present

## 2020-04-30 DIAGNOSIS — M5416 Radiculopathy, lumbar region: Secondary | ICD-10-CM

## 2020-04-30 DIAGNOSIS — M5126 Other intervertebral disc displacement, lumbar region: Secondary | ICD-10-CM | POA: Diagnosis not present

## 2020-04-30 DIAGNOSIS — M47816 Spondylosis without myelopathy or radiculopathy, lumbar region: Secondary | ICD-10-CM | POA: Diagnosis not present

## 2020-04-30 DIAGNOSIS — R200 Anesthesia of skin: Secondary | ICD-10-CM | POA: Diagnosis not present

## 2020-04-30 MED ORDER — GADOBENATE DIMEGLUMINE 529 MG/ML IV SOLN
20.0000 mL | Freq: Once | INTRAVENOUS | Status: AC | PRN
  Administered 2020-04-30: 20 mL via INTRAVENOUS

## 2020-05-01 DIAGNOSIS — F341 Dysthymic disorder: Secondary | ICD-10-CM | POA: Diagnosis not present

## 2020-05-01 DIAGNOSIS — F900 Attention-deficit hyperactivity disorder, predominantly inattentive type: Secondary | ICD-10-CM | POA: Diagnosis not present

## 2020-05-01 DIAGNOSIS — F410 Panic disorder [episodic paroxysmal anxiety] without agoraphobia: Secondary | ICD-10-CM | POA: Diagnosis not present

## 2020-05-02 DIAGNOSIS — M5416 Radiculopathy, lumbar region: Secondary | ICD-10-CM | POA: Diagnosis not present

## 2020-05-06 DIAGNOSIS — N951 Menopausal and female climacteric states: Secondary | ICD-10-CM | POA: Diagnosis not present

## 2020-05-06 DIAGNOSIS — I10 Essential (primary) hypertension: Secondary | ICD-10-CM | POA: Diagnosis not present

## 2020-05-07 DIAGNOSIS — F332 Major depressive disorder, recurrent severe without psychotic features: Secondary | ICD-10-CM | POA: Diagnosis not present

## 2020-05-07 DIAGNOSIS — F339 Major depressive disorder, recurrent, unspecified: Secondary | ICD-10-CM | POA: Diagnosis not present

## 2020-05-08 ENCOUNTER — Ambulatory Visit (INDEPENDENT_AMBULATORY_CARE_PROVIDER_SITE_OTHER): Payer: BC Managed Care – PPO | Admitting: Otolaryngology

## 2020-05-08 DIAGNOSIS — Z79899 Other long term (current) drug therapy: Secondary | ICD-10-CM | POA: Diagnosis not present

## 2020-05-08 DIAGNOSIS — N951 Menopausal and female climacteric states: Secondary | ICD-10-CM | POA: Diagnosis not present

## 2020-05-08 DIAGNOSIS — Z6834 Body mass index (BMI) 34.0-34.9, adult: Secondary | ICD-10-CM | POA: Diagnosis not present

## 2020-05-08 DIAGNOSIS — R6882 Decreased libido: Secondary | ICD-10-CM | POA: Diagnosis not present

## 2020-05-08 DIAGNOSIS — R232 Flushing: Secondary | ICD-10-CM | POA: Diagnosis not present

## 2020-05-09 DIAGNOSIS — R102 Pelvic and perineal pain: Secondary | ICD-10-CM | POA: Diagnosis not present

## 2020-05-15 ENCOUNTER — Ambulatory Visit (INDEPENDENT_AMBULATORY_CARE_PROVIDER_SITE_OTHER): Payer: BC Managed Care – PPO | Admitting: Otolaryngology

## 2020-05-17 DIAGNOSIS — F339 Major depressive disorder, recurrent, unspecified: Secondary | ICD-10-CM | POA: Diagnosis not present

## 2020-05-21 DIAGNOSIS — R1031 Right lower quadrant pain: Secondary | ICD-10-CM | POA: Diagnosis not present

## 2020-05-21 DIAGNOSIS — J309 Allergic rhinitis, unspecified: Secondary | ICD-10-CM | POA: Diagnosis not present

## 2020-06-05 DIAGNOSIS — N951 Menopausal and female climacteric states: Secondary | ICD-10-CM | POA: Diagnosis not present

## 2020-06-07 DIAGNOSIS — R232 Flushing: Secondary | ICD-10-CM | POA: Diagnosis not present

## 2020-06-07 DIAGNOSIS — N951 Menopausal and female climacteric states: Secondary | ICD-10-CM | POA: Diagnosis not present

## 2020-06-07 DIAGNOSIS — Z6836 Body mass index (BMI) 36.0-36.9, adult: Secondary | ICD-10-CM | POA: Diagnosis not present

## 2020-06-07 DIAGNOSIS — R6882 Decreased libido: Secondary | ICD-10-CM | POA: Diagnosis not present

## 2020-06-17 DIAGNOSIS — L718 Other rosacea: Secondary | ICD-10-CM | POA: Diagnosis not present

## 2020-06-17 DIAGNOSIS — L570 Actinic keratosis: Secondary | ICD-10-CM | POA: Diagnosis not present

## 2020-06-17 DIAGNOSIS — L218 Other seborrheic dermatitis: Secondary | ICD-10-CM | POA: Diagnosis not present

## 2020-07-22 DIAGNOSIS — G4733 Obstructive sleep apnea (adult) (pediatric): Secondary | ICD-10-CM | POA: Diagnosis not present

## 2020-07-23 DIAGNOSIS — M5416 Radiculopathy, lumbar region: Secondary | ICD-10-CM | POA: Diagnosis not present

## 2020-07-24 DIAGNOSIS — F332 Major depressive disorder, recurrent severe without psychotic features: Secondary | ICD-10-CM | POA: Diagnosis not present

## 2020-08-01 DIAGNOSIS — M5416 Radiculopathy, lumbar region: Secondary | ICD-10-CM | POA: Diagnosis not present

## 2020-08-02 ENCOUNTER — Other Ambulatory Visit: Payer: Self-pay | Admitting: Neurological Surgery

## 2020-08-02 DIAGNOSIS — M5416 Radiculopathy, lumbar region: Secondary | ICD-10-CM

## 2020-08-08 DIAGNOSIS — N951 Menopausal and female climacteric states: Secondary | ICD-10-CM | POA: Diagnosis not present

## 2020-08-12 DIAGNOSIS — N951 Menopausal and female climacteric states: Secondary | ICD-10-CM | POA: Diagnosis not present

## 2020-08-12 DIAGNOSIS — R6882 Decreased libido: Secondary | ICD-10-CM | POA: Diagnosis not present

## 2020-08-12 DIAGNOSIS — R232 Flushing: Secondary | ICD-10-CM | POA: Diagnosis not present

## 2020-08-12 DIAGNOSIS — Z6837 Body mass index (BMI) 37.0-37.9, adult: Secondary | ICD-10-CM | POA: Diagnosis not present

## 2020-08-15 DIAGNOSIS — G4733 Obstructive sleep apnea (adult) (pediatric): Secondary | ICD-10-CM | POA: Diagnosis not present

## 2020-08-16 ENCOUNTER — Ambulatory Visit
Admission: RE | Admit: 2020-08-16 | Discharge: 2020-08-16 | Disposition: A | Discharge status: Home / Self Care | Payer: BC Managed Care – PPO | Source: Ambulatory Visit | Attending: Neurological Surgery | Admitting: Neurological Surgery

## 2020-08-16 DIAGNOSIS — M545 Low back pain, unspecified: Secondary | ICD-10-CM | POA: Diagnosis not present

## 2020-08-16 DIAGNOSIS — M5416 Radiculopathy, lumbar region: Secondary | ICD-10-CM

## 2020-08-20 ENCOUNTER — Other Ambulatory Visit: Payer: Self-pay | Admitting: Family Medicine

## 2020-08-20 DIAGNOSIS — R0600 Dyspnea, unspecified: Secondary | ICD-10-CM | POA: Diagnosis not present

## 2020-08-20 DIAGNOSIS — K5900 Constipation, unspecified: Secondary | ICD-10-CM | POA: Diagnosis not present

## 2020-08-20 DIAGNOSIS — E039 Hypothyroidism, unspecified: Secondary | ICD-10-CM | POA: Diagnosis not present

## 2020-08-20 DIAGNOSIS — R0609 Other forms of dyspnea: Secondary | ICD-10-CM

## 2020-08-20 DIAGNOSIS — Z0000 Encounter for general adult medical examination without abnormal findings: Secondary | ICD-10-CM | POA: Diagnosis not present

## 2020-08-20 DIAGNOSIS — E7800 Pure hypercholesterolemia, unspecified: Secondary | ICD-10-CM | POA: Diagnosis not present

## 2020-08-20 DIAGNOSIS — M5416 Radiculopathy, lumbar region: Secondary | ICD-10-CM | POA: Diagnosis not present

## 2020-09-09 DIAGNOSIS — N951 Menopausal and female climacteric states: Secondary | ICD-10-CM | POA: Diagnosis not present

## 2020-09-12 DIAGNOSIS — Z6839 Body mass index (BMI) 39.0-39.9, adult: Secondary | ICD-10-CM | POA: Diagnosis not present

## 2020-09-12 DIAGNOSIS — N898 Other specified noninflammatory disorders of vagina: Secondary | ICD-10-CM | POA: Diagnosis not present

## 2020-09-12 DIAGNOSIS — N951 Menopausal and female climacteric states: Secondary | ICD-10-CM | POA: Diagnosis not present

## 2020-09-12 DIAGNOSIS — R232 Flushing: Secondary | ICD-10-CM | POA: Diagnosis not present

## 2020-09-16 DIAGNOSIS — M25561 Pain in right knee: Secondary | ICD-10-CM | POA: Diagnosis not present

## 2020-09-17 ENCOUNTER — Other Ambulatory Visit: Payer: Self-pay

## 2020-09-17 ENCOUNTER — Ambulatory Visit
Admission: RE | Admit: 2020-09-17 | Discharge: 2020-09-17 | Disposition: A | Discharge status: Home / Self Care | Payer: BC Managed Care – PPO | Source: Ambulatory Visit | Attending: Family Medicine | Admitting: Family Medicine

## 2020-09-17 DIAGNOSIS — R0600 Dyspnea, unspecified: Secondary | ICD-10-CM

## 2020-09-17 DIAGNOSIS — R911 Solitary pulmonary nodule: Secondary | ICD-10-CM | POA: Diagnosis not present

## 2020-09-17 DIAGNOSIS — R0609 Other forms of dyspnea: Secondary | ICD-10-CM

## 2020-09-19 DIAGNOSIS — M25561 Pain in right knee: Secondary | ICD-10-CM | POA: Diagnosis not present

## 2020-09-19 DIAGNOSIS — M5441 Lumbago with sciatica, right side: Secondary | ICD-10-CM | POA: Diagnosis not present

## 2020-09-19 DIAGNOSIS — M545 Low back pain, unspecified: Secondary | ICD-10-CM | POA: Diagnosis not present

## 2020-09-20 ENCOUNTER — Other Ambulatory Visit: Payer: Self-pay | Admitting: Orthopedic Surgery

## 2020-09-20 DIAGNOSIS — M25561 Pain in right knee: Secondary | ICD-10-CM

## 2020-09-24 ENCOUNTER — Ambulatory Visit
Admission: RE | Admit: 2020-09-24 | Discharge: 2020-09-24 | Disposition: A | Discharge status: Home / Self Care | Payer: BC Managed Care – PPO | Source: Ambulatory Visit | Attending: Orthopedic Surgery | Admitting: Orthopedic Surgery

## 2020-09-24 DIAGNOSIS — M25561 Pain in right knee: Secondary | ICD-10-CM | POA: Diagnosis not present

## 2020-09-26 ENCOUNTER — Other Ambulatory Visit: Payer: BC Managed Care – PPO

## 2020-09-26 DIAGNOSIS — M1711 Unilateral primary osteoarthritis, right knee: Secondary | ICD-10-CM | POA: Diagnosis not present

## 2020-09-26 DIAGNOSIS — F339 Major depressive disorder, recurrent, unspecified: Secondary | ICD-10-CM | POA: Diagnosis not present

## 2020-10-21 DIAGNOSIS — G4733 Obstructive sleep apnea (adult) (pediatric): Secondary | ICD-10-CM | POA: Diagnosis not present

## 2020-10-22 DIAGNOSIS — F332 Major depressive disorder, recurrent severe without psychotic features: Secondary | ICD-10-CM | POA: Diagnosis not present

## 2020-10-23 DIAGNOSIS — F339 Major depressive disorder, recurrent, unspecified: Secondary | ICD-10-CM | POA: Diagnosis not present

## 2020-10-29 DIAGNOSIS — G4700 Insomnia, unspecified: Secondary | ICD-10-CM | POA: Diagnosis not present

## 2020-10-29 DIAGNOSIS — F900 Attention-deficit hyperactivity disorder, predominantly inattentive type: Secondary | ICD-10-CM | POA: Diagnosis not present

## 2020-10-29 DIAGNOSIS — F331 Major depressive disorder, recurrent, moderate: Secondary | ICD-10-CM | POA: Diagnosis not present

## 2020-10-30 DIAGNOSIS — M94261 Chondromalacia, right knee: Secondary | ICD-10-CM | POA: Diagnosis not present

## 2020-10-30 DIAGNOSIS — M6751 Plica syndrome, right knee: Secondary | ICD-10-CM | POA: Diagnosis not present

## 2020-10-30 DIAGNOSIS — G8918 Other acute postprocedural pain: Secondary | ICD-10-CM | POA: Diagnosis not present

## 2020-11-04 DIAGNOSIS — Z130 Encounter for screening for diseases of the blood and blood-forming organs and certain disorders involving the immune mechanism: Secondary | ICD-10-CM | POA: Diagnosis not present

## 2020-11-04 DIAGNOSIS — Z1329 Encounter for screening for other suspected endocrine disorder: Secondary | ICD-10-CM | POA: Diagnosis not present

## 2020-11-04 DIAGNOSIS — Z79899 Other long term (current) drug therapy: Secondary | ICD-10-CM | POA: Diagnosis not present

## 2020-11-06 DIAGNOSIS — N951 Menopausal and female climacteric states: Secondary | ICD-10-CM | POA: Diagnosis not present

## 2020-11-07 DIAGNOSIS — M25562 Pain in left knee: Secondary | ICD-10-CM | POA: Diagnosis not present

## 2020-11-07 DIAGNOSIS — M1712 Unilateral primary osteoarthritis, left knee: Secondary | ICD-10-CM | POA: Diagnosis not present

## 2020-11-11 DIAGNOSIS — N951 Menopausal and female climacteric states: Secondary | ICD-10-CM | POA: Diagnosis not present

## 2020-11-11 DIAGNOSIS — R6882 Decreased libido: Secondary | ICD-10-CM | POA: Diagnosis not present

## 2020-11-11 DIAGNOSIS — R232 Flushing: Secondary | ICD-10-CM | POA: Diagnosis not present

## 2020-11-12 DIAGNOSIS — F339 Major depressive disorder, recurrent, unspecified: Secondary | ICD-10-CM | POA: Diagnosis not present

## 2020-11-15 DIAGNOSIS — Z1389 Encounter for screening for other disorder: Secondary | ICD-10-CM | POA: Diagnosis not present

## 2020-11-15 DIAGNOSIS — Z1231 Encounter for screening mammogram for malignant neoplasm of breast: Secondary | ICD-10-CM | POA: Diagnosis not present

## 2020-11-15 DIAGNOSIS — Z6841 Body Mass Index (BMI) 40.0 and over, adult: Secondary | ICD-10-CM | POA: Diagnosis not present

## 2020-11-15 DIAGNOSIS — Z01419 Encounter for gynecological examination (general) (routine) without abnormal findings: Secondary | ICD-10-CM | POA: Diagnosis not present

## 2020-11-27 DIAGNOSIS — F339 Major depressive disorder, recurrent, unspecified: Secondary | ICD-10-CM | POA: Diagnosis not present

## 2020-12-03 DIAGNOSIS — M5416 Radiculopathy, lumbar region: Secondary | ICD-10-CM | POA: Diagnosis not present

## 2020-12-03 DIAGNOSIS — M25561 Pain in right knee: Secondary | ICD-10-CM | POA: Diagnosis not present

## 2020-12-04 ENCOUNTER — Other Ambulatory Visit: Payer: Self-pay | Admitting: Neurological Surgery

## 2020-12-04 DIAGNOSIS — M5416 Radiculopathy, lumbar region: Secondary | ICD-10-CM

## 2020-12-06 DIAGNOSIS — F339 Major depressive disorder, recurrent, unspecified: Secondary | ICD-10-CM | POA: Diagnosis not present

## 2020-12-14 ENCOUNTER — Other Ambulatory Visit: Payer: Self-pay

## 2020-12-14 ENCOUNTER — Ambulatory Visit
Admission: RE | Admit: 2020-12-14 | Discharge: 2020-12-14 | Disposition: A | Discharge status: Home / Self Care | Payer: BC Managed Care – PPO | Source: Ambulatory Visit | Attending: Neurological Surgery | Admitting: Neurological Surgery

## 2020-12-14 DIAGNOSIS — M5126 Other intervertebral disc displacement, lumbar region: Secondary | ICD-10-CM | POA: Diagnosis not present

## 2020-12-14 DIAGNOSIS — M4326 Fusion of spine, lumbar region: Secondary | ICD-10-CM | POA: Diagnosis not present

## 2020-12-14 DIAGNOSIS — M5416 Radiculopathy, lumbar region: Secondary | ICD-10-CM

## 2020-12-14 DIAGNOSIS — Z981 Arthrodesis status: Secondary | ICD-10-CM | POA: Diagnosis not present

## 2020-12-17 DIAGNOSIS — I10 Essential (primary) hypertension: Secondary | ICD-10-CM | POA: Diagnosis not present

## 2020-12-17 DIAGNOSIS — M5416 Radiculopathy, lumbar region: Secondary | ICD-10-CM | POA: Diagnosis not present

## 2020-12-17 DIAGNOSIS — Z6841 Body Mass Index (BMI) 40.0 and over, adult: Secondary | ICD-10-CM | POA: Diagnosis not present

## 2020-12-26 DIAGNOSIS — M1712 Unilateral primary osteoarthritis, left knee: Secondary | ICD-10-CM | POA: Diagnosis not present

## 2020-12-26 DIAGNOSIS — M1711 Unilateral primary osteoarthritis, right knee: Secondary | ICD-10-CM | POA: Diagnosis not present

## 2021-01-03 DIAGNOSIS — M461 Sacroiliitis, not elsewhere classified: Secondary | ICD-10-CM | POA: Diagnosis not present

## 2021-01-21 DIAGNOSIS — G4733 Obstructive sleep apnea (adult) (pediatric): Secondary | ICD-10-CM | POA: Diagnosis not present

## 2021-01-27 DIAGNOSIS — M25561 Pain in right knee: Secondary | ICD-10-CM | POA: Diagnosis not present

## 2021-01-27 DIAGNOSIS — M25562 Pain in left knee: Secondary | ICD-10-CM | POA: Diagnosis not present

## 2021-01-30 ENCOUNTER — Other Ambulatory Visit: Payer: Self-pay | Admitting: Orthopedic Surgery

## 2021-01-30 DIAGNOSIS — Z01811 Encounter for preprocedural respiratory examination: Secondary | ICD-10-CM

## 2021-02-12 DIAGNOSIS — I493 Ventricular premature depolarization: Secondary | ICD-10-CM | POA: Diagnosis not present

## 2021-02-12 DIAGNOSIS — F3342 Major depressive disorder, recurrent, in full remission: Secondary | ICD-10-CM | POA: Diagnosis not present

## 2021-02-12 DIAGNOSIS — I4811 Longstanding persistent atrial fibrillation: Secondary | ICD-10-CM | POA: Diagnosis not present

## 2021-02-12 DIAGNOSIS — F900 Attention-deficit hyperactivity disorder, predominantly inattentive type: Secondary | ICD-10-CM | POA: Diagnosis not present

## 2021-02-12 DIAGNOSIS — F410 Panic disorder [episodic paroxysmal anxiety] without agoraphobia: Secondary | ICD-10-CM | POA: Diagnosis not present

## 2021-02-12 DIAGNOSIS — I456 Pre-excitation syndrome: Secondary | ICD-10-CM | POA: Diagnosis not present

## 2021-02-13 ENCOUNTER — Encounter (HOSPITAL_COMMUNITY): Payer: Self-pay

## 2021-02-13 DIAGNOSIS — N951 Menopausal and female climacteric states: Secondary | ICD-10-CM | POA: Diagnosis not present

## 2021-02-13 DIAGNOSIS — I4510 Unspecified right bundle-branch block: Secondary | ICD-10-CM | POA: Diagnosis not present

## 2021-02-13 NOTE — Progress Notes (Addendum)
PCP - Melinda Crutch MD Cardiologist -Dr. Marijo File  Clearance 02-12-21 Taylor Council, DNP epic  PPM/ICD -  Device Orders -  Rep Notified -   Chest x-ray - CT chest 09-17-21 epic EKG - 02-12-2021  on chart Stress Test - stress echo 2020 [facility] ECHO -  Cardiac Cath -   Sleep Study -  CPAP -   Fasting Blood Sugar -  Checks Blood Sugar _____ times a day  Blood Thinner Instructions: Aspirin Instructions:  ERAS Protcol - PRE-SURGERY Ensure   COVID TEST- 02-26-21 COVID vaccine -  Activity--Able to walk a flight of stairs without SOB Anesthesia review: WPW, OSA wears CPAP  Patient denies shortness of breath, fever, cough and chest pain at PAT appointment   All instructions explained to the patient, with a verbal understanding of the material. Patient agrees to go over the instructions while at home for a better understanding. Patient also instructed to self quarantine after being tested for COVID-19. The opportunity to ask questions was provided.

## 2021-02-13 NOTE — Patient Instructions (Addendum)
DUE TO COVID-19 ONLY ONE VISITOR IS ALLOWED TO COME WITH YOU AND STAY IN THE WAITING ROOM ONLY DURING PRE OP AND PROCEDURE DAY OF SURGERY.   TWO VISITOR  MAY VISIT WITH YOU AFTER SURGERY IN YOUR PRIVATE ROOM DURING VISITING HOURS ONLY!  YOU NEED TO HAVE A COVID 19 TEST ON__8-31-22______between 8am-3pm_____, THIS TEST MUST BE DONE BEFORE SURGERY,           Please bring completed form With you to the covid testing site   COVID TESTING SITE [place] TEST IS COMPLETED,  PLEASE Wear a mask when in public           Your procedure is scheduled on: 02-28-21   Report to [place]  Entrance   Report to short stay  at    [place] AM     Call this number if you have problems the morning of surgery [phone]   Remember: NO SOLID FOOD AFTER MIDNIGHT THE NIGHT PRIOR TO SURGERY. NOTHING BY MOUTH EXCEPT CLEAR LIQUIDS UNTIL    0430 am . PLEASE FINISH ENSURE DRINK PER SURGEON ORDER  WHICH NEEDS TO BE COMPLETED AT      0430 am then nothing by mouth .      CLEAR LIQUID DIET                                                                    water Black Coffee and tea, regular and decaf                             Plain Jell-O any favor except red or purple                                  Fruit ices (not with fruit pulp)                                      Iced Popsicles                                     Carbonated beverages, regular and diet                                    Cranberry, grape and apple juices Sports drinks like Gatorade Lightly seasoned clear broth or consume(fat free) Sugar, honey syrup  _____________________________________________________________________     BRUSH YOUR TEETH MORNING OF SURGERY AND RINSE YOUR MOUTH OUT, NO CHEWING GUM CANDY OR MINTS.     Take these medicines the morning of surgery with A SIP OF WATER: Vibryd, Rexulti, Lyrica, oxycodone, Liothyronine, levothyroxine, Duloxetine,  adderall if needed  DO NOT TAKE ANY DIABETIC MEDICATIONS DAY OF YOUR SURGERY  You may not have any metal on your body including hair pins and              piercings  Do not wear jewelry, make-up, lotions, powders,perfumes,      deodorant             Do not wear nail polish on your fingernails or toenails .  Do not shave  48 hours prior to surgery.               Do not bring valuables to the hospital. [place].  Contacts, dentures or bridgework may not be worn into surgery.       Patients discharged the day of surgery will not be allowed to drive home. IF YOU ARE HAVING SURGERY AND GOING HOME THE SAME DAY, YOU MUST HAVE AN ADULT TO DRIVE YOU HOME AND BE WITH YOU FOR 24 HOURS. YOU MAY GO HOME BY TAXI OR UBER OR ORTHERWISE, BUT AN ADULT MUST ACCOMPANY YOU HOME AND STAY WITH YOU FOR 24 HOURS.  Name and phone number of your driver:  Special Instructions: N/A              Please read over the following fact sheets you were given: _____________________________________________________________________             [facility] - Preparing for Surgery Before surgery, you can play an important role.  Because skin is not sterile, your skin needs to be as free of germs as possible.  You can reduce the number of germs on your skin by washing with CHG (chlorahexidine gluconate) soap before surgery.  CHG is an antiseptic cleaner which kills germs and bonds with the skin to continue killing germs even after washing. Please DO NOT use if you have an allergy to CHG or antibacterial soaps.  If your skin becomes reddened/irritated stop using the CHG and inform your nurse when you arrive at Short Stay. Do not shave (including legs and underarms) for at least 48 hours prior to the first CHG shower.  You may shave your face/neck. Please follow these instructions carefully:  1.  Shower with CHG Soap the night before surgery and the   morning of Surgery.  2.  If you choose to wash your hair, wash your hair first as usual with your  normal  shampoo.  3.  After you shampoo, rinse your hair and body thoroughly to remove the  shampoo.                           4.  Use CHG as you would any other liquid soap.  You can apply chg directly  to the skin and wash                       Gently with a scrungie or clean washcloth.  5.  Apply the CHG Soap to your body ONLY FROM THE NECK DOWN.   Do not use on face/ open                           Wound or open sores. Avoid contact with eyes, ears mouth and genitals (private parts).  Wash face,  Genitals (private parts) with your normal soap.             6.  Wash thoroughly, paying special attention to the area where your surgery  will be performed.  7.  Thoroughly rinse your body with warm water from the neck down.  8.  DO NOT shower/wash with your normal soap after using and rinsing off  the CHG Soap.                9.  Pat yourself dry with a clean towel.            10.  Wear clean pajamas.            11.  Place clean sheets on your bed the night of your first shower and do not  sleep with pets. Day of Surgery : Do not apply any lotions/deodorants the morning of surgery.  Please wear clean clothes to the hospital/surgery center.  FAILURE TO FOLLOW THESE INSTRUCTIONS MAY RESULT IN THE CANCELLATION OF YOUR SURGERY PATIENT SIGNATURE_________________________________  NURSE SIGNATURE__________________________________  _______  [name]  An incentive spirometer is a tool that can help keep your lungs clear and active. This tool measures how well you are filling your lungs with each breath. Taking long deep breaths may help reverse or decrease the chance of developing breathing (pulmonary) problems (especially infection) following: A long period of time when you are unable to move or be active. BEFORE THE PROCEDURE  If the spirometer includes an indicator to  show your best effort, your nurse or respiratory therapist will set it to a desired goal. If possible, sit up straight or lean slightly forward. Try not to slouch. Hold the incentive spirometer in an upright position. INSTRUCTIONS FOR USE  Sit on the edge of your bed if possible, or sit up as far as you can in bed or on a chair. Hold the incentive spirometer in an upright position. Breathe out normally. Place the mouthpiece in your mouth and seal your lips tightly around it. Breathe in slowly and as deeply as possible, raising the piston or the ball toward the top of the column. Hold your breath for 3-5 seconds or for as long as possible. Allow the piston or ball to fall to the bottom of the column. Remove the mouthpiece from your mouth and breathe out normally. Rest for a few seconds and repeat Steps 1 through 7 at least 10 times every 1-2 hours when you are awake. Take your time and take a few normal breaths between deep breaths. The spirometer may include an indicator to show your best effort. Use the indicator as a goal to work toward during each repetition. After each set of 10 deep breaths, practice coughing to be sure your lungs are clear. If you have an incision (the cut made at the time of surgery), support your incision when coughing by placing a pillow or rolled up towels firmly against it. Once you are able to get out of bed, walk around indoors and cough well. You may stop using the incentive spirometer when instructed by your caregiver.  RISKS AND COMPLICATIONS Take your time so you do not get dizzy or light-headed. If you are in pain, you may need to take or ask for pain medication before doing incentive spirometry. It is harder to take a deep breath if you are having pain. AFTER USE Rest and breathe slowly and easily. It can be helpful to keep track of a  log of your progress. Your caregiver can provide you with a simple table to help with this. If you are using the spirometer at  home, follow these instructions: [place] IF:  You are having difficultly using the spirometer. You have trouble using the spirometer as often as instructed. Your pain medication is not giving enough relief while using the spirometer. You develop fever of 100.5 F (38.1 C) or higher. SEEK IMMEDIATE MEDICAL CARE IF:  You cough up bloody sputum that had not been present before. You develop fever of 102 F (38.9 C) or greater. You develop worsening pain at or near the incision site. MAKE SURE YOU:  Understand these instructions. Will watch your condition. Will get help right away if you are not doing well or get worse. Document Released: 10/26/2006 Document Revised: 09/07/2011 Document Reviewed: 12/27/2006 [facility] Patient Information 2014 [employer], [place].   ________________________________________________________________________ _________________________________________________________________

## 2021-02-20 ENCOUNTER — Other Ambulatory Visit: Payer: Self-pay

## 2021-02-20 ENCOUNTER — Ambulatory Visit (HOSPITAL_COMMUNITY)
Admission: RE | Admit: 2021-02-20 | Discharge: 2021-02-20 | Disposition: A | Discharge status: Home / Self Care | Payer: BC Managed Care – PPO | Source: Ambulatory Visit | Attending: Orthopedic Surgery | Admitting: Orthopedic Surgery

## 2021-02-20 ENCOUNTER — Encounter (HOSPITAL_COMMUNITY): Payer: Self-pay

## 2021-02-20 ENCOUNTER — Encounter (HOSPITAL_COMMUNITY)
Admission: RE | Admit: 2021-02-20 | Discharge: 2021-02-20 | Disposition: A | Discharge status: Home / Self Care | Payer: BC Managed Care – PPO | Source: Ambulatory Visit | Attending: Orthopedic Surgery | Admitting: Orthopedic Surgery

## 2021-02-20 DIAGNOSIS — Z01811 Encounter for preprocedural respiratory examination: Secondary | ICD-10-CM | POA: Diagnosis not present

## 2021-02-20 DIAGNOSIS — Z01818 Encounter for other preprocedural examination: Secondary | ICD-10-CM | POA: Diagnosis not present

## 2021-02-20 LAB — CBC WITH DIFFERENTIAL/PLATELET
Abs Immature Granulocytes: 0.03 10*3/uL (ref 0.00–0.07)
Basophils Absolute: 0 10*3/uL (ref 0.0–0.1)
Basophils Relative: 0 %
Eosinophils Absolute: 0.2 10*3/uL (ref 0.0–0.5)
Eosinophils Relative: 2 %
HCT: 49.5 % — ABNORMAL HIGH (ref 36.0–46.0)
Hemoglobin: 16.1 g/dL — ABNORMAL HIGH (ref 12.0–15.0)
Immature Granulocytes: 0 %
Lymphocytes Relative: 18 %
Lymphs Abs: 1.6 10*3/uL (ref 0.7–4.0)
MCH: 29.4 pg (ref 26.0–34.0)
MCHC: 32.5 g/dL (ref 30.0–36.0)
MCV: 90.5 fL (ref 80.0–100.0)
Monocytes Absolute: 0.9 10*3/uL (ref 0.1–1.0)
Monocytes Relative: 10 %
Neutro Abs: 6.4 10*3/uL (ref 1.7–7.7)
Neutrophils Relative %: 70 %
Platelets: 223 10*3/uL (ref 150–400)
RBC: 5.47 MIL/uL — ABNORMAL HIGH (ref 3.87–5.11)
RDW: 13.3 % (ref 11.5–15.5)
WBC: 9.2 10*3/uL (ref 4.0–10.5)
nRBC: 0 % (ref 0.0–0.2)

## 2021-02-20 LAB — COMPREHENSIVE METABOLIC PANEL
ALT: 26 U/L (ref 0–44)
AST: 26 U/L (ref 15–41)
Albumin: 4.1 g/dL (ref 3.5–5.0)
Alkaline Phosphatase: 81 U/L (ref 38–126)
Anion gap: 6 (ref 5–15)
BUN: 11 mg/dL (ref 6–20)
CO2: 28 mmol/L (ref 22–32)
Calcium: 9.5 mg/dL (ref 8.9–10.3)
Chloride: 104 mmol/L (ref 98–111)
Creatinine, Ser: 0.73 mg/dL (ref 0.44–1.00)
GFR, Estimated: >60 mL/min (ref >60)
Glucose, Bld: 102 mg/dL — ABNORMAL HIGH (ref 70–99)
Potassium: 4.4 mmol/L (ref 3.5–5.1)
Sodium: 138 mmol/L (ref 135–145)
Total Bilirubin: 0.5 mg/dL (ref 0.3–1.2)
Total Protein: 7.3 g/dL (ref 6.5–8.1)

## 2021-02-20 LAB — URINALYSIS, ROUTINE W REFLEX MICROSCOPIC
Bilirubin Urine: NEGATIVE
Glucose, UA: NEGATIVE mg/dL
Hgb urine dipstick: NEGATIVE
Ketones, ur: NEGATIVE mg/dL
Leukocytes,Ua: NEGATIVE
Nitrite: NEGATIVE
Protein, ur: NEGATIVE mg/dL
Specific Gravity, Urine: 1.002 — ABNORMAL LOW (ref 1.005–1.030)
pH: 7 (ref 5.0–8.0)

## 2021-02-20 LAB — PROTIME-INR
INR: 1 (ref 0.8–1.2)
Prothrombin Time: 12.9 seconds (ref 11.4–15.2)

## 2021-02-20 LAB — SURGICAL PCR SCREEN
MRSA, PCR: NEGATIVE
Staphylococcus aureus: POSITIVE — AB

## 2021-02-20 LAB — APTT: aPTT: 34 seconds (ref 24–36)

## 2021-02-21 DIAGNOSIS — Z6839 Body mass index (BMI) 39.0-39.9, adult: Secondary | ICD-10-CM | POA: Diagnosis not present

## 2021-02-21 DIAGNOSIS — R6882 Decreased libido: Secondary | ICD-10-CM | POA: Diagnosis not present

## 2021-02-21 DIAGNOSIS — N951 Menopausal and female climacteric states: Secondary | ICD-10-CM | POA: Diagnosis not present

## 2021-02-21 DIAGNOSIS — N898 Other specified noninflammatory disorders of vagina: Secondary | ICD-10-CM | POA: Diagnosis not present

## 2021-02-21 NOTE — Progress Notes (Signed)
Anesthesia Chart Review   Case: [id number] Date/Time: 02/28/21 0955   Procedure: TOTAL KNEE ARTHROPLASTY (Right: Knee)   Anesthesia type: Spinal   Pre-op diagnosis: RIGHT KNEE DEGENERATIVE JOINT DISEASE   Location: [place] 08 / [facility]   Surgeons: Dorna Leitz, MD       DISCUSSION:50 y.o. some day smoker with h/o PONV, GERD, sleep apnea, WPW s/p ablation 1996, PVCs, right knee djd scheduled for above procedure 02/28/21 with Dr. Dorna Leitz.   Pt last seen by cardiology 02/12/2021. Per OV note, "Based on risk score evaluation today with the the Duke Activity Status Index this patient seems to be at acceptable risk for cardiovascular event during upcoming surgical procedure. She has the ability to perform 8.91 MET levels of activity (greater than 4 METS levels of activity quired for surgical clearance) and may proceed with surgery with no additional cardiac testing or procedures. Activity seems limited only by orthopedic issues-history of 8 back surgeries with implantation of rods in the back and neck per patient report"  Anticipate pt can proceed with planned procedure barring acute status change.   VS: BP 133/68   Pulse 84   Temp 36.7 C (Oral)   Resp 16   Ht $R'5\' 8"'Lc$  (1.727 m)   Wt 115.7 kg   LMP  (LMP Unknown)   SpO2 98%   BMI 38.77 kg/m   PROVIDERS: Lawerance Cruel, MD is PCP   Marijo File, MD is Cardiologist  LABS: Labs reviewed: Acceptable for surgery. (all labs ordered are listed, but only abnormal results are displayed)  Labs Reviewed  SURGICAL PCR SCREEN - Abnormal; Notable for the following components:      Result Value   Staphylococcus aureus POSITIVE (*)    All other components within normal limits  CBC WITH DIFFERENTIAL/PLATELET - Abnormal; Notable for the following components:   RBC 5.47 (*)    Hemoglobin 16.1 (*)    HCT 49.5 (*)    All other components within normal limits  COMPREHENSIVE METABOLIC PANEL - Abnormal; Notable for the following components:   Glucose,  Bld 102 (*)    All other components within normal limits  URINALYSIS, ROUTINE W REFLEX MICROSCOPIC - Abnormal; Notable for the following components:   Color, Urine STRAW (*)    Specific Gravity, Urine 1.002 (*)    All other components within normal limits  PROTIME-INR  APTT  TYPE AND SCREEN     IMAGES:   EKG: On chart  CV:  Past Medical History:  Diagnosis Date   ADD (attention deficit disorder)    Anxiety    Breast mass 02/2014   Cervical pseudoarthrosis (HCC)    Chronic back pain    Chronic constipation    Complication of anesthesia    after 07/01/2017 cervical c5-t2, pt states she had hallucination x 2 wks after surgery   Depression    Fibromyalgia    GERD (gastroesophageal reflux disease)    H/O cardiac radiofrequency ablation 1996   History of kidney stones    one lodged in right kidney - 80mm    HSV infection    Hypertension    Hyperthyroidism    at on time - now hypothyroidism   Hypothyroidism    Neuromuscular disorder (HCC)    sciatica    PONV (postoperative nausea and vomiting)    Rheumatoid arthritis (HCC)    OA- lumbar & cervical     Sleep apnea    CPAP used nightly   Wolff-Parkinson-White (WPW) syndrome  Wolff-Parkinson-White pattern 1994   had ablation surgery    Past Surgical History:  Procedure Laterality Date   ABDOMINAL HYSTERECTOMY     BACK SURGERY     x 5 lumbar, 3 cervical   CARDIAC ELECTROPHYSIOLOGY MAPPING AND ABLATION     WPW   CERVICAL SPINE SURGERY     fusions x 3   COLONOSCOPY  04/06/2013   CYSTOSCOPY N/A 04/06/2018   Procedure: CYSTOSCOPY;  Surgeon: Paula Compton, MD;  Location: [facility];  Service: Gynecology;  Laterality: N/A;   ESOPHAGOGASTRODUODENOSCOPY  04/06/2013   LAPAROSCOPY  04/06/2018   Procedure: LAPAROSCOPY DIAGNOSTIC;  Surgeon: Paula Compton, MD;  Location: [facility];  Service: Gynecology;;   LUMBAR LAMINECTOMY/DECOMPRESSION MICRODISCECTOMY Right 09/02/2016   Procedure: RIGHT LUMBAR FIVE-SACRAL ONE REDO  MICRODISCECTOMY;  Surgeon: Eustace Moore, MD;  Location: [place];  Service: Neurosurgery;  Laterality: Right;   LUMBAR SPINE SURGERY     x 4   POSTERIOR CERVICAL FUSION/FORAMINOTOMY N/A 07/01/2017   Procedure: Posterior Cervical Fusion with lateral mass fixation Cervical Six-Seven;  Surgeon: Eustace Moore, MD;  Location: [place];  Service: Neurosurgery;  Laterality: N/A;  Posterior Cervical Fusion with lateral mass fixation Cervical Six-Seven   SIGMOIDOSCOPY     SUPRACERVICAL ABDOMINAL HYSTERECTOMY N/A 04/06/2018   Procedure: SUPRACERVICAL HYSTERECTOMY ABDOMINAL WITH LEFT OOPHORECTOMY AND BILATERAL SALPINGECTOMY;  Surgeon: Paula Compton, MD;  Location: [facility];  Service: Gynecology;  Laterality: N/A;   THYROIDECTOMY     TONSILLECTOMY     WISDOM TOOTH EXTRACTION      MEDICATIONS:  amphetamine-dextroamphetamine (ADDERALL) 20 MG tablet   ASHWAGANDHA PO   b complex vitamins tablet   Cholecalciferol (VITAMIN D-3) 125 MCG (5000 UT) TABS   clonazePAM (KLONOPIN) 1 MG tablet   DAYVIGO 10 MG TABS   diclofenac (VOLTAREN) 75 MG EC tablet   doxepin (SINEQUAN) 50 MG capsule   DULoxetine (CYMBALTA) 60 MG capsule   famotidine (PEPCID) 40 MG tablet   fluticasone (FLONASE) 50 MCG/ACT nasal spray   folic acid (FOLVITE) 957 MCG tablet   levothyroxine (SYNTHROID) 150 MCG tablet   lidocaine (LIDODERM) 5 %   liothyronine (CYTOMEL) 5 MCG tablet   lithium carbonate (ESKALITH) 450 MG CR tablet   Melatonin 10 MG TABS   Misc Natural Products (JOINT HEALTH PO)   Multiple Vitamin (MULTIVITAMIN WITH MINERALS) TABS tablet   ondansetron (ZOFRAN-ODT) 8 MG disintegrating tablet   Oxycodone HCl 20 MG TABS   oxymetazoline (AFRIN) 0.05 % nasal spray   pregabalin (LYRICA) 50 MG capsule   REXULTI 3 MG TABS   sodium chloride (OCEAN) 0.65 % SOLN nasal spray   SOOLANTRA 1 % CREA   valACYclovir (VALTREX) 1000 MG tablet   valsartan (DIOVAN) 80 MG tablet   VIIBRYD 40 MG TABS   No current facility-administered  medications for this encounter.    Konrad Felix Ward, PA-C [facility] Pre-Surgical Testing [phone]

## 2021-02-21 NOTE — Anesthesia Preprocedure Evaluation (Addendum)
Anesthesia Evaluation  Patient identified by MRN, date of birth, ID band Patient awake    Reviewed: Allergy & Precautions, NPO status , Patient's Chart, lab work & pertinent test results  History of Anesthesia Complications Negative for: history of anesthetic complications  Airway Mallampati: II  TM Distance: >3 FB Neck ROM: Full    Dental  (+) Teeth Intact, Dental Advisory Given   Pulmonary sleep apnea and Continuous Positive Airway Pressure Ventilation , Current Smoker and Patient abstained from smoking.,    Pulmonary exam normal        Cardiovascular METS: 7 - 9 Mets hypertension, Pt. on medications Normal cardiovascular exam+ dysrhythmias (WPW s/p ablation 1996)      Neuro/Psych Anxiety Depression H/o multiple lumbar fusions    GI/Hepatic Neg liver ROS, GERD  ,  Endo/Other  Hypothyroidism   Renal/GU negative Renal ROS  negative genitourinary   Musculoskeletal  (+) Arthritis , Osteoarthritis,  Fibromyalgia -, narcotic dependent  Abdominal   Peds  Hematology negative hematology ROS (+)   Anesthesia Other Findings   Reproductive/Obstetrics                           Anesthesia Physical Anesthesia Plan  ASA: 2  Anesthesia Plan: General   Post-op Pain Management:    Induction: Intravenous  PONV Risk Score and Plan: 2 and Ondansetron, Dexamethasone, Midazolam and Treatment may vary due to age or medical condition  Airway Management Planned: LMA  Additional Equipment: None  Intra-op Plan:   Post-operative Plan: Extubation in OR  Informed Consent: I have reviewed the patients History and Physical, chart, labs and discussed the procedure including the risks, benefits and alternatives for the proposed anesthesia with the patient or authorized representative who has indicated his/her understanding and acceptance.     Dental advisory given  Plan Discussed with:   Anesthesia Plan  Comments: (See PAT note 02/20/2021, Konrad Felix Ward, PA-C)      Anesthesia Quick Evaluation

## 2021-02-24 ENCOUNTER — Other Ambulatory Visit (HOSPITAL_COMMUNITY): Payer: Self-pay | Admitting: Family Medicine

## 2021-02-24 DIAGNOSIS — R899 Unspecified abnormal finding in specimens from other organs, systems and tissues: Secondary | ICD-10-CM | POA: Diagnosis not present

## 2021-02-24 DIAGNOSIS — M7989 Other specified soft tissue disorders: Secondary | ICD-10-CM | POA: Diagnosis not present

## 2021-02-24 DIAGNOSIS — M79605 Pain in left leg: Secondary | ICD-10-CM

## 2021-02-24 DIAGNOSIS — M2550 Pain in unspecified joint: Secondary | ICD-10-CM | POA: Diagnosis not present

## 2021-02-25 ENCOUNTER — Ambulatory Visit (HOSPITAL_COMMUNITY)
Admission: RE | Admit: 2021-02-25 | Discharge: 2021-02-25 | Disposition: A | Discharge status: Home / Self Care | Payer: BC Managed Care – PPO | Source: Ambulatory Visit | Attending: Surgery | Admitting: Surgery

## 2021-02-25 ENCOUNTER — Other Ambulatory Visit: Payer: Self-pay

## 2021-02-25 DIAGNOSIS — M79605 Pain in left leg: Secondary | ICD-10-CM | POA: Insufficient documentation

## 2021-02-25 DIAGNOSIS — M7989 Other specified soft tissue disorders: Secondary | ICD-10-CM | POA: Insufficient documentation

## 2021-02-25 NOTE — Progress Notes (Signed)
Lower extremity venous LT study completed.   Please see CV Proc for preliminary results.   Gerlean Cid, RDMS, RVT  

## 2021-02-26 ENCOUNTER — Other Ambulatory Visit: Payer: Self-pay | Admitting: Orthopedic Surgery

## 2021-02-26 LAB — SARS CORONAVIRUS 2 (TAT 6-24 HRS): SARS Coronavirus 2: NEGATIVE

## 2021-02-27 DIAGNOSIS — M1711 Unilateral primary osteoarthritis, right knee: Secondary | ICD-10-CM | POA: Diagnosis not present

## 2021-02-28 ENCOUNTER — Other Ambulatory Visit: Payer: Self-pay

## 2021-02-28 ENCOUNTER — Encounter (HOSPITAL_COMMUNITY)
Admission: RE | Disposition: A | Discharge status: Home / Self Care | Payer: Self-pay | Source: Ambulatory Visit | Attending: Orthopedic Surgery

## 2021-02-28 ENCOUNTER — Observation Stay (HOSPITAL_COMMUNITY)
Admission: RE | Admit: 2021-02-28 | Discharge: 2021-02-28 | Disposition: A | Discharge status: Home / Self Care | Payer: BC Managed Care – PPO | Source: Ambulatory Visit | Attending: Orthopedic Surgery | Admitting: Orthopedic Surgery

## 2021-02-28 ENCOUNTER — Ambulatory Visit (HOSPITAL_COMMUNITY): Payer: BC Managed Care – PPO | Admitting: Physician Assistant

## 2021-02-28 ENCOUNTER — Ambulatory Visit (HOSPITAL_COMMUNITY): Payer: BC Managed Care – PPO | Admitting: Registered Nurse

## 2021-02-28 ENCOUNTER — Encounter (HOSPITAL_COMMUNITY): Payer: Self-pay | Admitting: Orthopedic Surgery

## 2021-02-28 DIAGNOSIS — Z79899 Other long term (current) drug therapy: Secondary | ICD-10-CM | POA: Diagnosis not present

## 2021-02-28 DIAGNOSIS — M1711 Unilateral primary osteoarthritis, right knee: Secondary | ICD-10-CM | POA: Diagnosis present

## 2021-02-28 DIAGNOSIS — E890 Postprocedural hypothyroidism: Secondary | ICD-10-CM | POA: Diagnosis not present

## 2021-02-28 DIAGNOSIS — Z96651 Presence of right artificial knee joint: Secondary | ICD-10-CM | POA: Diagnosis not present

## 2021-02-28 DIAGNOSIS — Z87891 Personal history of nicotine dependence: Secondary | ICD-10-CM | POA: Diagnosis not present

## 2021-02-28 DIAGNOSIS — M170 Bilateral primary osteoarthritis of knee: Secondary | ICD-10-CM | POA: Diagnosis not present

## 2021-02-28 DIAGNOSIS — I10 Essential (primary) hypertension: Secondary | ICD-10-CM | POA: Insufficient documentation

## 2021-02-28 DIAGNOSIS — K219 Gastro-esophageal reflux disease without esophagitis: Secondary | ICD-10-CM | POA: Diagnosis not present

## 2021-02-28 DIAGNOSIS — M1712 Unilateral primary osteoarthritis, left knee: Secondary | ICD-10-CM | POA: Diagnosis present

## 2021-02-28 DIAGNOSIS — G8918 Other acute postprocedural pain: Secondary | ICD-10-CM | POA: Diagnosis not present

## 2021-02-28 DIAGNOSIS — E7849 Other hyperlipidemia: Secondary | ICD-10-CM | POA: Diagnosis not present

## 2021-02-28 HISTORY — PX: TOTAL KNEE ARTHROPLASTY: SHX125

## 2021-02-28 LAB — TYPE AND SCREEN
ABO/RH(D): O NEG
Antibody Screen: NEGATIVE

## 2021-02-28 SURGERY — ARTHROPLASTY, KNEE, TOTAL
Anesthesia: General | Site: Knee | Laterality: Right

## 2021-02-28 MED ORDER — KETAMINE HCL 10 MG/ML IJ SOLN
INTRAMUSCULAR | Status: AC
  Filled 2021-02-28: qty 1

## 2021-02-28 MED ORDER — HYDROMORPHONE HCL 1 MG/ML IJ SOLN
0.5000 mg | INTRAMUSCULAR | Status: DC | PRN
  Administered 2021-02-28: 1 mg via INTRAVENOUS
  Filled 2021-02-28: qty 1

## 2021-02-28 MED ORDER — SODIUM CHLORIDE 0.9 % IV SOLN: INTRAVENOUS | Status: DC

## 2021-02-28 MED ORDER — METHOCARBAMOL 500 MG PO TABS: 500.0000 mg | ORAL_TABLET | Freq: Four times a day (QID) | ORAL | Status: DC | PRN

## 2021-02-28 MED ORDER — PHENOL 1.4 % MT LIQD: 1.0000 | OROMUCOSAL | Status: DC | PRN

## 2021-02-28 MED ORDER — METHYLPREDNISOLONE ACETATE 80 MG/ML IJ SUSP
INTRAMUSCULAR | Status: DC | PRN
  Administered 2021-02-28: 80 mg via INTRA_ARTICULAR

## 2021-02-28 MED ORDER — ASPIRIN EC 325 MG PO TBEC
325.0000 mg | DELAYED_RELEASE_TABLET | Freq: Two times a day (BID) | ORAL | Status: DC
  Administered 2021-02-28: 325 mg via ORAL
  Filled 2021-02-28: qty 1

## 2021-02-28 MED ORDER — LIOTHYRONINE SODIUM 5 MCG PO TABS: 10.0000 ug | ORAL_TABLET | Freq: Every day | ORAL | Status: DC

## 2021-02-28 MED ORDER — DEXAMETHASONE SODIUM PHOSPHATE 10 MG/ML IJ SOLN
INTRAMUSCULAR | Status: DC | PRN
  Administered 2021-02-28: 10 mg via INTRAVENOUS

## 2021-02-28 MED ORDER — ONDANSETRON HCL 4 MG PO TABS: 4.0000 mg | ORAL_TABLET | Freq: Four times a day (QID) | ORAL | Status: DC | PRN

## 2021-02-28 MED ORDER — OXYCODONE HCL 5 MG PO TABS: 5.0000 mg | ORAL_TABLET | Freq: Once | ORAL | Status: DC | PRN

## 2021-02-28 MED ORDER — HYDROMORPHONE HCL 1 MG/ML IJ SOLN
0.2500 mg | INTRAMUSCULAR | Status: DC | PRN
  Administered 2021-02-28 (×4): 0.5 mg via INTRAVENOUS

## 2021-02-28 MED ORDER — BUPIVACAINE-EPINEPHRINE 0.5% -1:200000 IJ SOLN
INTRAMUSCULAR | Status: AC
  Filled 2021-02-28: qty 1

## 2021-02-28 MED ORDER — BUPIVACAINE LIPOSOME 1.3 % IJ SUSP
20.0000 mL | Freq: Once | INTRAMUSCULAR | Status: DC
Start: 2021-02-28 — End: 2021-02-28

## 2021-02-28 MED ORDER — TRANEXAMIC ACID-NACL 1000-0.7 MG/100ML-% IV SOLN
1000.0000 mg | Freq: Once | INTRAVENOUS | Status: AC
  Administered 2021-02-28: 1000 mg via INTRAVENOUS
  Filled 2021-02-28: qty 100

## 2021-02-28 MED ORDER — METHOCARBAMOL 500 MG IVPB - SIMPLE MED
INTRAVENOUS | Status: AC
  Filled 2021-02-28: qty 50

## 2021-02-28 MED ORDER — SODIUM CHLORIDE (PF) 0.9 % IJ SOLN
INTRAMUSCULAR | Status: AC
  Filled 2021-02-28: qty 10

## 2021-02-28 MED ORDER — ROCURONIUM BROMIDE 10 MG/ML (PF) SYRINGE
PREFILLED_SYRINGE | INTRAVENOUS | Status: AC
  Filled 2021-02-28: qty 20

## 2021-02-28 MED ORDER — EPHEDRINE 5 MG/ML INJ
INTRAVENOUS | Status: AC
  Filled 2021-02-28: qty 5

## 2021-02-28 MED ORDER — MIDAZOLAM HCL 2 MG/2ML IJ SOLN
INTRAMUSCULAR | Status: AC
  Administered 2021-02-28: 2 mg
  Filled 2021-02-28: qty 2

## 2021-02-28 MED ORDER — AMPHETAMINE-DEXTROAMPHETAMINE 10 MG PO TABS: 30.0000 mg | ORAL_TABLET | Freq: Two times a day (BID) | ORAL | Status: DC | PRN

## 2021-02-28 MED ORDER — ZOLPIDEM TARTRATE 5 MG PO TABS: 5.0000 mg | ORAL_TABLET | Freq: Every day | ORAL | Status: DC

## 2021-02-28 MED ORDER — MIDAZOLAM HCL 2 MG/2ML IJ SOLN
INTRAMUSCULAR | Status: AC
  Filled 2021-02-28: qty 2

## 2021-02-28 MED ORDER — FENTANYL CITRATE (PF) 100 MCG/2ML IJ SOLN
INTRAMUSCULAR | Status: AC
  Filled 2021-02-28: qty 2

## 2021-02-28 MED ORDER — LITHIUM CARBONATE ER 450 MG PO TBCR
450.0000 mg | EXTENDED_RELEASE_TABLET | Freq: Every day | ORAL | Status: DC
  Filled 2021-02-28: qty 1

## 2021-02-28 MED ORDER — SUFENTANIL CITRATE 50 MCG/ML IV SOLN
INTRAVENOUS | Status: DC | PRN
  Administered 2021-02-28: 15 ug via INTRAVENOUS
  Administered 2021-02-28 (×3): 10 ug via INTRAVENOUS

## 2021-02-28 MED ORDER — HYDROMORPHONE HCL 1 MG/ML IJ SOLN
INTRAMUSCULAR | Status: AC
  Administered 2021-02-28: 1 mg
  Filled 2021-02-28: qty 1

## 2021-02-28 MED ORDER — BUPIVACAINE LIPOSOME 1.3 % IJ SUSP
INTRAMUSCULAR | Status: DC | PRN
  Administered 2021-02-28: 20 mL

## 2021-02-28 MED ORDER — CEFAZOLIN SODIUM-DEXTROSE 2-4 GM/100ML-% IV SOLN
2.0000 g | Freq: Four times a day (QID) | INTRAVENOUS | Status: DC
Start: 2021-02-28 — End: 2021-03-01
  Administered 2021-02-28: 2 g via INTRAVENOUS
  Filled 2021-02-28: qty 100

## 2021-02-28 MED ORDER — PHENYLEPHRINE HCL (PRESSORS) 10 MG/ML IV SOLN
INTRAVENOUS | Status: AC
  Filled 2021-02-28: qty 1

## 2021-02-28 MED ORDER — DIPHENHYDRAMINE HCL 12.5 MG/5ML PO ELIX: 12.5000 mg | ORAL_SOLUTION | ORAL | Status: DC | PRN

## 2021-02-28 MED ORDER — BUPIVACAINE LIPOSOME 1.3 % IJ SUSP
INTRAMUSCULAR | Status: AC
  Filled 2021-02-28: qty 20

## 2021-02-28 MED ORDER — ONDANSETRON HCL 4 MG/2ML IJ SOLN
INTRAMUSCULAR | Status: AC
  Filled 2021-02-28: qty 2

## 2021-02-28 MED ORDER — ALUM & MAG HYDROXIDE-SIMETH 200-200-20 MG/5ML PO SUSP: 30.0000 mL | ORAL | Status: DC | PRN

## 2021-02-28 MED ORDER — ASPIRIN EC 325 MG PO TBEC: 325.0000 mg | DELAYED_RELEASE_TABLET | Freq: Two times a day (BID) | ORAL | 0 refills | Status: DC

## 2021-02-28 MED ORDER — KETAMINE HCL 10 MG/ML IJ SOLN
INTRAMUSCULAR | Status: DC | PRN
  Administered 2021-02-28 (×2): 30 mg via INTRAVENOUS

## 2021-02-28 MED ORDER — CHLORHEXIDINE GLUCONATE 0.12 % MT SOLN
15.0000 mL | Freq: Once | OROMUCOSAL | Status: AC
  Administered 2021-02-28: 15 mL via OROMUCOSAL

## 2021-02-28 MED ORDER — KETOROLAC TROMETHAMINE 30 MG/ML IJ SOLN
INTRAMUSCULAR | Status: AC
  Filled 2021-02-28: qty 1

## 2021-02-28 MED ORDER — MIDAZOLAM HCL 2 MG/2ML IJ SOLN
INTRAMUSCULAR | Status: DC | PRN
  Administered 2021-02-28 (×2): 2 mg via INTRAVENOUS

## 2021-02-28 MED ORDER — MENTHOL 3 MG MT LOZG: 1.0000 | LOZENGE | OROMUCOSAL | Status: DC | PRN

## 2021-02-28 MED ORDER — CEFAZOLIN SODIUM-DEXTROSE 2-4 GM/100ML-% IV SOLN
INTRAVENOUS | Status: AC
  Filled 2021-02-28: qty 100

## 2021-02-28 MED ORDER — DULOXETINE HCL 60 MG PO CPEP: 60.0000 mg | ORAL_CAPSULE | Freq: Every day | ORAL | Status: DC

## 2021-02-28 MED ORDER — SODIUM CHLORIDE 0.9% FLUSH
INTRAVENOUS | Status: DC | PRN
  Administered 2021-02-28: 50 mL

## 2021-02-28 MED ORDER — POVIDONE-IODINE 10 % EX SWAB
2.0000 "application " | Freq: Once | CUTANEOUS | Status: AC
  Administered 2021-02-28: 2 via TOPICAL

## 2021-02-28 MED ORDER — ONDANSETRON HCL 4 MG/2ML IJ SOLN: 4.0000 mg | Freq: Once | INTRAMUSCULAR | Status: DC | PRN

## 2021-02-28 MED ORDER — PROPOFOL 10 MG/ML IV BOLUS
INTRAVENOUS | Status: AC
  Filled 2021-02-28: qty 20

## 2021-02-28 MED ORDER — ONDANSETRON HCL 4 MG/2ML IJ SOLN: 4.0000 mg | Freq: Four times a day (QID) | INTRAMUSCULAR | Status: DC | PRN

## 2021-02-28 MED ORDER — DEXMEDETOMIDINE (PRECEDEX) IN NS 20 MCG/5ML (4 MCG/ML) IV SYRINGE
PREFILLED_SYRINGE | INTRAVENOUS | Status: DC | PRN
  Administered 2021-02-28: 8 ug via INTRAVENOUS

## 2021-02-28 MED ORDER — LIDOCAINE 2% (20 MG/ML) 5 ML SYRINGE
INTRAMUSCULAR | Status: AC
  Filled 2021-02-28: qty 10

## 2021-02-28 MED ORDER — DOXEPIN HCL 50 MG PO CAPS
100.0000 mg | ORAL_CAPSULE | Freq: Every day | ORAL | Status: DC
  Filled 2021-02-28: qty 2

## 2021-02-28 MED ORDER — PROPOFOL 1000 MG/100ML IV EMUL
INTRAVENOUS | Status: AC
  Filled 2021-02-28: qty 100

## 2021-02-28 MED ORDER — SODIUM CHLORIDE 0.9 % IR SOLN
Status: DC | PRN
  Administered 2021-02-28: 1000 mL

## 2021-02-28 MED ORDER — BISACODYL 5 MG PO TBEC: 5.0000 mg | DELAYED_RELEASE_TABLET | Freq: Every day | ORAL | Status: DC | PRN

## 2021-02-28 MED ORDER — PHENYLEPHRINE HCL (PRESSORS) 10 MG/ML IV SOLN
INTRAVENOUS | Status: AC
  Filled 2021-02-28: qty 2

## 2021-02-28 MED ORDER — SUFENTANIL CITRATE 50 MCG/ML IV SOLN
INTRAVENOUS | Status: AC
  Filled 2021-02-28: qty 1

## 2021-02-28 MED ORDER — BUPIVACAINE HCL (PF) 0.5 % IJ SOLN
INTRAMUSCULAR | Status: DC | PRN
  Administered 2021-02-28: 8 mL

## 2021-02-28 MED ORDER — BREXPIPRAZOLE 1 MG PO TABS: 3.0000 mg | ORAL_TABLET | Freq: Every day | ORAL | Status: DC

## 2021-02-28 MED ORDER — BUPIVACAINE-EPINEPHRINE 0.5% -1:200000 IJ SOLN
INTRAMUSCULAR | Status: DC | PRN
  Administered 2021-02-28: 30 mL

## 2021-02-28 MED ORDER — FENTANYL CITRATE (PF) 250 MCG/5ML IJ SOLN
INTRAMUSCULAR | Status: DC | PRN
  Administered 2021-02-28: 100 ug via INTRAVENOUS

## 2021-02-28 MED ORDER — WATER FOR IRRIGATION, STERILE IR SOLN
Status: DC | PRN
  Administered 2021-02-28: 2000 mL

## 2021-02-28 MED ORDER — LACTATED RINGERS IV SOLN
INTRAVENOUS | Status: DC
Start: 2021-02-28 — End: 2021-02-28

## 2021-02-28 MED ORDER — TRANEXAMIC ACID-NACL 1000-0.7 MG/100ML-% IV SOLN
INTRAVENOUS | Status: AC
  Filled 2021-02-28: qty 100

## 2021-02-28 MED ORDER — CLONAZEPAM 1 MG PO TABS: 3.0000 mg | ORAL_TABLET | Freq: Every day | ORAL | Status: DC

## 2021-02-28 MED ORDER — DEXAMETHASONE SODIUM PHOSPHATE 10 MG/ML IJ SOLN: 10.0000 mg | Freq: Two times a day (BID) | INTRAMUSCULAR | Status: DC

## 2021-02-28 MED ORDER — PHENYLEPHRINE 40 MCG/ML (10ML) SYRINGE FOR IV PUSH (FOR BLOOD PRESSURE SUPPORT)
PREFILLED_SYRINGE | INTRAVENOUS | Status: AC
  Filled 2021-02-28: qty 10

## 2021-02-28 MED ORDER — OXYCODONE HCL 5 MG/5ML PO SOLN: 5.0000 mg | Freq: Once | ORAL | Status: DC | PRN

## 2021-02-28 MED ORDER — FENTANYL CITRATE PF 50 MCG/ML IJ SOSY
PREFILLED_SYRINGE | INTRAMUSCULAR | Status: AC
  Administered 2021-02-28: 100 ug
  Filled 2021-02-28: qty 2

## 2021-02-28 MED ORDER — AMPHETAMINE-DEXTROAMPHETAMINE 20 MG PO TABS: 20.0000 mg | ORAL_TABLET | Freq: Two times a day (BID) | ORAL | Status: DC

## 2021-02-28 MED ORDER — AMISULPRIDE (ANTIEMETIC) 5 MG/2ML IV SOLN: 10.0000 mg | Freq: Once | INTRAVENOUS | Status: DC | PRN

## 2021-02-28 MED ORDER — SUCCINYLCHOLINE CHLORIDE 200 MG/10ML IV SOSY
PREFILLED_SYRINGE | INTRAVENOUS | Status: AC
  Filled 2021-02-28: qty 20

## 2021-02-28 MED ORDER — HYDROMORPHONE HCL 1 MG/ML IJ SOLN
INTRAMUSCULAR | Status: AC
  Filled 2021-02-28: qty 1

## 2021-02-28 MED ORDER — KETOROLAC TROMETHAMINE 30 MG/ML IJ SOLN
INTRAMUSCULAR | Status: DC | PRN
  Administered 2021-02-28 (×2): 30 mg via INTRAVENOUS

## 2021-02-28 MED ORDER — TRANEXAMIC ACID-NACL 1000-0.7 MG/100ML-% IV SOLN
1000.0000 mg | INTRAVENOUS | Status: AC
  Administered 2021-02-28: 1000 mg via INTRAVENOUS

## 2021-02-28 MED ORDER — ROPIVACAINE HCL 5 MG/ML IJ SOLN
INTRAMUSCULAR | Status: DC | PRN
  Administered 2021-02-28: 30 mL via PERINEURAL

## 2021-02-28 MED ORDER — VILAZODONE HCL 20 MG PO TABS: 40.0000 mg | ORAL_TABLET | Freq: Every day | ORAL | Status: DC

## 2021-02-28 MED ORDER — BUPIVACAINE HCL (PF) 0.5 % IJ SOLN
INTRAMUSCULAR | Status: AC
  Filled 2021-02-28: qty 30

## 2021-02-28 MED ORDER — METHOCARBAMOL 500 MG IVPB - SIMPLE MED
500.0000 mg | Freq: Four times a day (QID) | INTRAVENOUS | Status: DC | PRN
  Administered 2021-02-28: 500 mg via INTRAVENOUS
  Filled 2021-02-28: qty 50

## 2021-02-28 MED ORDER — ORAL CARE MOUTH RINSE: 15.0000 mL | Freq: Once | OROMUCOSAL | Status: AC

## 2021-02-28 MED ORDER — IRBESARTAN 75 MG PO TABS
75.0000 mg | ORAL_TABLET | Freq: Every day | ORAL | Status: DC
  Administered 2021-02-28: 75 mg via ORAL
  Filled 2021-02-28: qty 1

## 2021-02-28 MED ORDER — HYDROMORPHONE HCL 2 MG PO TABS: 2.0000 mg | ORAL_TABLET | Freq: Four times a day (QID) | ORAL | 0 refills | Status: DC | PRN

## 2021-02-28 MED ORDER — OXYCODONE HCL 5 MG PO TABS
20.0000 mg | ORAL_TABLET | Freq: Every day | ORAL | Status: DC
Start: 2021-02-28 — End: 2021-03-01
  Administered 2021-02-28: 20 mg via ORAL
  Filled 2021-02-28: qty 4

## 2021-02-28 MED ORDER — 0.9 % SODIUM CHLORIDE (POUR BTL) OPTIME
TOPICAL | Status: DC | PRN
  Administered 2021-02-28: 1000 mL

## 2021-02-28 MED ORDER — CEFAZOLIN SODIUM-DEXTROSE 2-4 GM/100ML-% IV SOLN
2.0000 g | INTRAVENOUS | Status: AC
  Administered 2021-02-28: 2 g via INTRAVENOUS

## 2021-02-28 MED ORDER — FENTANYL CITRATE (PF) 250 MCG/5ML IJ SOLN
INTRAMUSCULAR | Status: AC
  Filled 2021-02-28: qty 5

## 2021-02-28 MED ORDER — LIP MEDEX EX OINT
TOPICAL_OINTMENT | CUTANEOUS | Status: AC
  Filled 2021-02-28: qty 7

## 2021-02-28 MED ORDER — PROPOFOL 10 MG/ML IV BOLUS
INTRAVENOUS | Status: DC | PRN
  Administered 2021-02-28: 200 mg via INTRAVENOUS

## 2021-02-28 MED ORDER — POLYETHYLENE GLYCOL 3350 17 G PO PACK: 17.0000 g | PACK | Freq: Every day | ORAL | Status: DC | PRN

## 2021-02-28 MED ORDER — DEXAMETHASONE SODIUM PHOSPHATE 10 MG/ML IJ SOLN
INTRAMUSCULAR | Status: AC
  Filled 2021-02-28: qty 1

## 2021-02-28 MED ORDER — ONDANSETRON HCL 4 MG/2ML IJ SOLN
INTRAMUSCULAR | Status: DC | PRN
  Administered 2021-02-28: 4 mg via INTRAVENOUS

## 2021-02-28 MED ORDER — LIDOCAINE 2% (20 MG/ML) 5 ML SYRINGE
INTRAMUSCULAR | Status: DC | PRN
  Administered 2021-02-28: 100 mg via INTRAVENOUS

## 2021-02-28 MED ORDER — DOCUSATE SODIUM 100 MG PO CAPS
100.0000 mg | ORAL_CAPSULE | Freq: Two times a day (BID) | ORAL | Status: DC
  Administered 2021-02-28: 100 mg via ORAL
  Filled 2021-02-28: qty 1

## 2021-02-28 MED ORDER — PREGABALIN 75 MG PO CAPS: 150.0000 mg | ORAL_CAPSULE | ORAL | Status: DC

## 2021-02-28 MED ORDER — METHYLPREDNISOLONE ACETATE 40 MG/ML IJ SUSP
INTRAMUSCULAR | Status: AC
  Filled 2021-02-28: qty 2

## 2021-02-28 MED ORDER — ACETAMINOPHEN 325 MG PO TABS: 325.0000 mg | ORAL_TABLET | Freq: Four times a day (QID) | ORAL | Status: DC | PRN

## 2021-02-28 MED ORDER — LEVOTHYROXINE SODIUM 75 MCG PO TABS: 150.0000 ug | ORAL_TABLET | Freq: Every day | ORAL | Status: DC

## 2021-02-28 SURGICAL SUPPLY — 58 items
APL SKNCLS STERI-STRIP NONHPOA (GAUZE/BANDAGES/DRESSINGS) ×1
ATTUNE MED DOME PAT 38 KNEE (Knees) ×1 IMPLANT
ATTUNE PSFEM RTSZ6 NARCEM KNEE (Femur) ×1 IMPLANT
ATTUNE PSRP INSR SZ6 8 KNEE (Insert) ×1 IMPLANT
BAG COUNTER SPONGE SURGICOUNT (BAG) IMPLANT
BAG SPEC THK2 15X12 ZIP CLS (MISCELLANEOUS) ×1
BAG SPNG CNTER NS LX DISP (BAG)
BAG ZIPLOCK 12X15 (MISCELLANEOUS) ×2 IMPLANT
BASE TIBIAL ROT PLAT SZ 5 KNEE (Knees) IMPLANT
BENZOIN TINCTURE PRP APPL 2/3 (GAUZE/BANDAGES/DRESSINGS) ×2 IMPLANT
BLADE SAGITTAL 25.0X1.19X90 (BLADE) ×2 IMPLANT
BLADE SAW SGTL 13.0X1.19X90.0M (BLADE) ×2 IMPLANT
BLADE SURG SZ10 CARB STEEL (BLADE) ×4 IMPLANT
BNDG CMPR MED 10X6 ELC LF (GAUZE/BANDAGES/DRESSINGS) ×1
BNDG ELASTIC 6X10 VLCR STRL LF (GAUZE/BANDAGES/DRESSINGS) ×1 IMPLANT
BNDG ELASTIC 6X5.8 VLCR STR LF (GAUZE/BANDAGES/DRESSINGS) ×2 IMPLANT
BOOTIES KNEE HIGH SLOAN (MISCELLANEOUS) ×2 IMPLANT
BOWL SMART MIX CTS (DISPOSABLE) ×2 IMPLANT
BSPLAT TIB 5 CMNT ROT PLAT STR (Knees) ×1 IMPLANT
CEMENT HV SMART SET (Cement) ×4 IMPLANT
COVER SURGICAL LIGHT HANDLE (MISCELLANEOUS) ×2 IMPLANT
CUFF TOURN SGL QUICK 34 (TOURNIQUET CUFF) ×2
CUFF TRNQT CYL 34X4.125X (TOURNIQUET CUFF) ×1 IMPLANT
DECANTER SPIKE VIAL GLASS SM (MISCELLANEOUS) ×4 IMPLANT
DRAPE U-SHAPE 47X51 STRL (DRAPES) ×2 IMPLANT
DRSG AQUACEL AG ADV 3.5X10 (GAUZE/BANDAGES/DRESSINGS) ×2 IMPLANT
DURAPREP 26ML APPLICATOR (WOUND CARE) ×2 IMPLANT
ELECT REM PT RETURN 15FT ADLT (MISCELLANEOUS) ×2 IMPLANT
GLOVE SRG 8 PF TXTR STRL LF DI (GLOVE) ×2 IMPLANT
GLOVE SURG NEOP MICRO LF SZ7.5 (GLOVE) ×4 IMPLANT
GLOVE SURG UNDER POLY LF SZ8 (GLOVE) ×4
GOWN STRL REUS W/TWL XL LVL3 (GOWN DISPOSABLE) ×4 IMPLANT
HANDPIECE INTERPULSE COAX TIP (DISPOSABLE) ×2
HOLDER FOLEY CATH W/STRAP (MISCELLANEOUS) IMPLANT
HOOD PEEL AWAY FLYTE STAYCOOL (MISCELLANEOUS) ×6 IMPLANT
KIT TURNOVER KIT A (KITS) ×2 IMPLANT
MANIFOLD NEPTUNE II (INSTRUMENTS) ×2 IMPLANT
NEEDLE HYPO 22GX1.5 SAFETY (NEEDLE) ×4 IMPLANT
NS IRRIG 1000ML POUR BTL (IV SOLUTION) ×2 IMPLANT
PACK ICE MAXI GEL EZY WRAP (MISCELLANEOUS) ×2 IMPLANT
PACK TOTAL KNEE CUSTOM (KITS) ×2 IMPLANT
PADDING CAST COTTON 6X4 STRL (CAST SUPPLIES) ×2 IMPLANT
PENCIL SMOKE EVACUATOR (MISCELLANEOUS) IMPLANT
PIN DRILL FIX HALF THREAD (BIT) ×1 IMPLANT
PIN STEINMAN FIXATION KNEE (PIN) ×1 IMPLANT
PROTECTOR NERVE ULNAR (MISCELLANEOUS) ×2 IMPLANT
SET HNDPC FAN SPRY TIP SCT (DISPOSABLE) ×1 IMPLANT
SPONGE GAUZE 2X2 8PLY STRL LF (GAUZE/BANDAGES/DRESSINGS) ×1 IMPLANT
STRIP CLOSURE SKIN 1/2X4 (GAUZE/BANDAGES/DRESSINGS) IMPLANT
SUT MNCRL AB 3-0 PS2 18 (SUTURE) ×2 IMPLANT
SUT VIC AB 0 CT1 36 (SUTURE) ×2 IMPLANT
SUT VIC AB 1 CT1 36 (SUTURE) ×4 IMPLANT
SYR CONTROL 10ML LL (SYRINGE) ×4 IMPLANT
TAPE STRIPS DRAPE STRL (GAUZE/BANDAGES/DRESSINGS) ×1 IMPLANT
TIBIAL BASE ROT PLAT SZ 5 KNEE (Knees) ×2 IMPLANT
TRAY FOLEY MTR SLVR 16FR STAT (SET/KITS/TRAYS/PACK) ×2 IMPLANT
WATER STERILE IRR 1000ML POUR (IV SOLUTION) ×4 IMPLANT
WRAP KNEE MAXI GEL POST OP (GAUZE/BANDAGES/DRESSINGS) ×1 IMPLANT

## 2021-02-28 NOTE — Evaluation (Signed)
Physical Therapy Evaluation Patient Details Name: [name] MRN: [id number] DOB: 1970/11/22 Today's Date: 02/28/2021   History of Present Illness  Pt s/p R TKR and with hx of 8 spinal surgeries, sciatica, ADD, Fibromyalgia, and Wolff-Parkinson-White Syndrome  Clinical Impression  Pt s/p R TKR and presents with decreased R LE strength/ROM and post op pain limiting functional mobility.  Pt should progress to dc home with family assist.    Follow Up Recommendations Home health PT    Equipment Recommendations  None recommended by PT    Recommendations for Other Services       Precautions / Restrictions Precautions Precautions: Fall;Knee Restrictions Weight Bearing Restrictions: No Other Position/Activity Restrictions: WBAT      Mobility  Bed Mobility Overal bed mobility: Needs Assistance Bed Mobility: Supine to Sit     Supine to sit: Min guard;HOB elevated     General bed mobility comments: min guard for R LE    Transfers Overall transfer level: Needs assistance Equipment used: Rolling walker (2 wheeled) Transfers: Sit to/from Stand Sit to Stand: Min assist         General transfer comment: cues for LE management and use of UEs to self assist  Ambulation/Gait Ambulation/Gait assistance: Min assist;Min guard Gait Distance (Feet): 140 Feet Assistive device: Rolling walker (2 wheeled) Gait Pattern/deviations: Step-to pattern;Step-through pattern;Decreased step length - right;Decreased step length - left;Shuffle Gait velocity: decr   General Gait Details: cues for sequence, posture and position from [employer]            Wheelchair Mobility    Modified Rankin (Stroke Patients Only)       Balance Overall balance assessment: Needs assistance Sitting-balance support: No upper extremity supported;Feet supported Sitting balance-Leahy Scale: Good     Standing balance support: Bilateral upper extremity supported Standing balance-Leahy  Scale: Poor                               Pertinent Vitals/Pain Pain Assessment: 0-10 Pain Score: 4  Pain Location: R knee Pain Descriptors / Indicators: Aching;Sore Pain Intervention(s): Limited activity within patient's tolerance;Monitored during session;Premedicated before session;Ice applied    Home Living Family/patient expects to be discharged to:: Private residence Living Arrangements: Spouse/significant other Available Help at Discharge: Family;Available 24 hours/day Type of Home: House Home Access: Stairs to enter   [employer] of Steps: 1 Home Layout: One level Home Equipment: Walker - 2 wheels;Bedside commode      Prior Function Level of Independence: Independent               Hand Dominance   Dominant Hand: Right    Extremity/Trunk Assessment   Upper Extremity Assessment Upper Extremity Assessment: Overall WFL for tasks assessed    Lower Extremity Assessment Lower Extremity Assessment: RLE deficits/detail    Cervical / Trunk Assessment Cervical / Trunk Assessment: Normal  Communication   Communication: No difficulties  Cognition Arousal/Alertness: Awake/alert Behavior During Therapy: WFL for tasks assessed/performed Overall Cognitive Status: Within Functional Limits for tasks assessed                                        General Comments      Exercises Total Joint Exercises Ankle Circles/Pumps: AROM;Both;15 reps;Supine   Assessment/Plan    PT Assessment Patient needs continued PT services  PT Problem List Decreased strength;Decreased  range of motion;Decreased activity tolerance;Decreased balance;Decreased mobility;Decreased knowledge of use of DME;Pain;Obesity       PT Treatment Interventions DME instruction;Gait training;Stair training;Functional mobility training;Therapeutic activities;Therapeutic exercise;Patient/family education    PT Goals (Current goals can be found in the Care Plan  section)  Acute Rehab PT Goals Patient Stated Goal: Regain IND PT Goal Formulation: With patient Time For Goal Achievement: 03/07/21 Potential to Achieve Goals: Good    Frequency 7X/week   Barriers to discharge        Co-evaluation               AM-PAC PT "6 Clicks" Mobility  Outcome Measure Help needed turning from your back to your side while in a flat bed without using bedrails?: A Little Help needed moving from lying on your back to sitting on the side of a flat bed without using bedrails?: A Little Help needed moving to and from a bed to a chair (including a wheelchair)?: A Little Help needed standing up from a chair using your arms (e.g., wheelchair or bedside chair)?: A Little Help needed to walk in hospital room?: A Little Help needed climbing 3-5 steps with a railing? : A Lot 6 Click Score: 17    End of Session Equipment Utilized During Treatment: Gait belt Activity Tolerance: Patient tolerated treatment well Patient left: in chair;with call bell/phone within reach;with chair alarm set;with family/visitor present;with nursing/sitter in room Nurse Communication: Mobility status PT Visit Diagnosis: Difficulty in walking, not elsewhere classified (R26.2)    Time: [id number] PT Time Calculation (min) (ACUTE ONLY): 24 min   Charges:   PT Evaluation $PT Eval Low Complexity: 1 Low PT Treatments $Gait Training: 8-22 mins        [place] Pager [phone] Office [phone]   Lavon Horn 02/28/2021, 5:18 PM

## 2021-02-28 NOTE — Transfer of Care (Signed)
Immediate Anesthesia Transfer of Care Note  Patient: [name]  Procedure(s) Performed: TOTAL KNEE ARTHROPLASTY, cortisone injection left knee (Right: Knee)  Patient Location: PACU  Anesthesia Type:General  Level of Consciousness: awake, sedated and patient cooperative  Airway & Oxygen Therapy: Patient Spontanous Breathing and Patient connected to face mask oxygen  Post-op Assessment: Report given to RN, Post -op Vital signs reviewed and stable and Patient moving all extremities X 4  Post vital signs: stable  Last Vitals:  Vitals Value Taken Time  BP 70/45 02/28/21 1218  Temp    Pulse 96 02/28/21 1225  Resp 10 02/28/21 1225  SpO2 96 % 02/28/21 1225  Vitals shown include unvalidated device data.  Last Pain:  Vitals:   02/28/21 1215  TempSrc:   PainSc: Asleep      Patients Stated Pain Goal: 7 ([phone]  Complications: No notable events documented.

## 2021-02-28 NOTE — Progress Notes (Signed)
Nursing staff called Dr. Berenice Primas and stated that she had some oozing through the dressing of her right total knee.  I came by to look at it.  She had some oozing but no active bleeders.  The Aquacel dressing was changed along with a pressure dressing using 4 x 4's, ABDs, and Kerlix.  An Ace wrap was applied.  The patient stated she wanted to go home and was doing well related to pain management.  She had done well with physical therapy.  The patient will be discharged home.  Her medications were all sent in earlier in the day.  She will contact our on-call physician and physician assistant if she has any further difficulties.

## 2021-02-28 NOTE — Discharge Instructions (Signed)

## 2021-02-28 NOTE — Progress Notes (Signed)
Dr. Dorna Leitz was notified about patient's bleeding from surgical site (copious amount of drainage). Reinforced with ABD pads and changed ACE wrap. Dr. Dorna Leitz notified PA Gary Fleet and he also came to assess patient's surgical site. Dressing changes done by PA (Aquacel, ABD pads, ACE wrap, and 4x4 guaze). Patient was given extra dressing supplies to take home and family was educated on how to reinforce dressings should the need arise. Patient was ready to be discharged.

## 2021-02-28 NOTE — Progress Notes (Signed)
The patient is alert and oriented and has been seen by her physician. The orders for discharge were written. IV has been removed. Went over discharge instructions with patient and family. She is being discharged via wheelchair with all of her belongings.  

## 2021-02-28 NOTE — Anesthesia Postprocedure Evaluation (Signed)
Anesthesia Post Note  Patient: [name]  Procedure(s) Performed: TOTAL KNEE ARTHROPLASTY, cortisone injection left knee (Right: Knee)     Patient location during evaluation: PACU Anesthesia Type: General Level of consciousness: awake and alert Pain management: pain level controlled Vital Signs Assessment: post-procedure vital signs reviewed and stable Respiratory status: spontaneous breathing, nonlabored ventilation and respiratory function stable Cardiovascular status: blood pressure returned to baseline and stable Postop Assessment: no apparent nausea or vomiting Anesthetic complications: no   No notable events documented.  Last Vitals:  Vitals:   02/28/21 1330 02/28/21 1358  BP: 134/71 125/78  Pulse: 84 78  Resp: 10   Temp:  36.5 C  SpO2: 94% 94%    Last Pain:  Vitals:   02/28/21 1358  TempSrc: Oral  PainSc:                  Lidia Collum

## 2021-02-28 NOTE — Anesthesia Procedure Notes (Addendum)
Procedure Name: LMA Insertion Date/Time: 02/28/2021 10:26 AM Performed by: Lissa Morales, CRNA Pre-anesthesia Checklist: Patient identified, Emergency Drugs available, Suction available and Patient being monitored Patient Re-evaluated:Patient Re-evaluated prior to induction Oxygen Delivery Method: Circle system utilized Preoxygenation: Pre-oxygenation with 100% oxygen Induction Type: IV induction LMA: LMA with gastric port inserted LMA Size: 4.0 Tube type: Oral Number of attempts: 1 Airway Equipment and Method: Oral airway Placement Confirmation: positive ETCO2 Tube secured with: Tape Dental Injury: Teeth and Oropharynx as per pre-operative assessment  Comments: Mouth very small, large tongue,reduced neck mobility

## 2021-02-28 NOTE — H&P (Signed)
TOTAL KNEE ADMISSION H&P  Patient is being admitted for right total knee arthroplasty.  Subjective:  Chief Complaint:right knee pain.  HPI: [name], 50 y.o. female, has a history of pain and functional disability in the right knee due to arthritis and has failed non-surgical conservative treatments for greater than 12 weeks to includeNSAID's and/or analgesics, corticosteriod injections, viscosupplementation injections, flexibility and strengthening excercises, weight reduction as appropriate, and activity modification.  Onset of symptoms was gradual, starting 5 years ago with gradually worsening course since that time. The patient noted prior procedures on the knee to include  arthroscopy and menisectomy on the right knee(s).  Patient currently rates pain in the right knee(s) at 10 out of 10 with activity. Patient has night pain, worsening of pain with activity and weight bearing, pain that interferes with activities of daily living, pain with passive range of motion, and joint swelling.  Patient has evidence of subchondral cysts, subchondral sclerosis, and joint space narrowing by imaging studies. This patient has had  failure of conservative care . There is no active infection.  Patient Active Problem List   Diagnosis Date Noted   S/P lumbar fusion 04/12/2019   Herpes simplex 12/12/2018   Insomnia 12/12/2018   Adiposity 12/12/2018   Allergic rhinitis 12/12/2018   Arthralgia of hand 12/12/2018   Avitaminosis D 12/12/2018   Breast lump 12/12/2018   Degenerative arthritis of temporomandibular joint 12/12/2018   Disc disorder 12/12/2018   Female infertility 12/12/2018   Hypothyroidism, postop 12/12/2018   Inflammation of sacroiliac joint ([place]) 12/12/2018   Inflammatory polyarthritis ([place]) 12/12/2018   Neuralgia neuritis, sciatic nerve 12/12/2018   Cervical pain 12/12/2018   Spasm 12/12/2018   Lateral epicondylitis of right elbow 08/30/2018   Other hyperlipidemia 08/25/2018    S/P TAH (total abdominal hysterectomy) 04/06/2018   Uterine leiomyoma 03/30/2018   S/P cervical spinal fusion 07/01/2017   S/P lumbar laminectomy 09/02/2016   PVC's (premature ventricular contractions) 03/26/2015   Shortness of breath 03/26/2015   Hypothyroidism 02/27/2015   PAC (premature atrial contraction) 02/27/2015   Palpitation 02/27/2015   WPW (Wolff-Parkinson-White syndrome) 12/13/2014   Chronic back pain 12/13/2014   Dysfunction, labyrinthine 09/21/2013   Past Medical History:  Diagnosis Date   ADD (attention deficit disorder)    Anxiety    Breast mass 02/2014   Cervical pseudoarthrosis (HCC)    Chronic back pain    Chronic constipation    Complication of anesthesia    after 07/01/2017 cervical c5-t2, pt states she had hallucination x 2 wks after surgery   Depression    Fibromyalgia    GERD (gastroesophageal reflux disease)    H/O cardiac radiofrequency ablation 1996   History of kidney stones    one lodged in right kidney - 63m    HSV infection    Hypertension    Hyperthyroidism    at on time - now hypothyroidism   Hypothyroidism    Neuromuscular disorder ([place]    sciatica    PONV (postoperative nausea and vomiting)    Rheumatoid arthritis (HCC)    OA- lumbar & cervical     Sleep apnea    CPAP used nightly   Wolff-Parkinson-White (WPW) syndrome    Wolff-Parkinson-White pattern 1994   had ablation surgery    Past Surgical History:  Procedure Laterality Date   ABDOMINAL HYSTERECTOMY     BACK SURGERY     x 5 lumbar, 3 cervical   CARDIAC ELECTROPHYSIOLOGY MAPPING AND ABLATION     WPW  CERVICAL SPINE SURGERY     fusions x 3   COLONOSCOPY  04/06/2013   CYSTOSCOPY N/A 04/06/2018   Procedure: CYSTOSCOPY;  Surgeon: Paula Compton, MD;  Location: [facility];  Service: Gynecology;  Laterality: N/A;   ESOPHAGOGASTRODUODENOSCOPY  04/06/2013   LAPAROSCOPY  04/06/2018   Procedure: LAPAROSCOPY DIAGNOSTIC;  Surgeon: Paula Compton, MD;  Location: [facility];   Service: Gynecology;;   LUMBAR LAMINECTOMY/DECOMPRESSION MICRODISCECTOMY Right 09/02/2016   Procedure: RIGHT LUMBAR FIVE-SACRAL ONE REDO MICRODISCECTOMY;  Surgeon: Eustace Moore, MD;  Location: [facility];  Service: Neurosurgery;  Laterality: Right;   LUMBAR SPINE SURGERY     x 4   POSTERIOR CERVICAL FUSION/FORAMINOTOMY N/A 07/01/2017   Procedure: Posterior Cervical Fusion with lateral mass fixation Cervical Six-Seven;  Surgeon: Eustace Moore, MD;  Location: [facility];  Service: Neurosurgery;  Laterality: N/A;  Posterior Cervical Fusion with lateral mass fixation Cervical Six-Seven   SIGMOIDOSCOPY     SUPRACERVICAL ABDOMINAL HYSTERECTOMY N/A 04/06/2018   Procedure: SUPRACERVICAL HYSTERECTOMY ABDOMINAL WITH LEFT OOPHORECTOMY AND BILATERAL SALPINGECTOMY;  Surgeon: Paula Compton, MD;  Location: [facility];  Service: Gynecology;  Laterality: N/A;   THYROIDECTOMY     TONSILLECTOMY     WISDOM TOOTH EXTRACTION      No current facility-administered medications for this encounter.   Current Outpatient Medications  Medication Sig Dispense Refill Last Dose   amphetamine-dextroamphetamine (ADDERALL) 20 MG tablet Take 20 mg by mouth 2 (two) times daily.       ASHWAGANDHA PO Take 3 capsules by mouth daily. 2100 mg      b complex vitamins tablet Take 1 tablet by mouth daily.      Cholecalciferol (VITAMIN D-3) 125 MCG (5000 UT) TABS Take 5,000 Units by mouth daily.      clonazePAM (KLONOPIN) 1 MG tablet Take 3 mg by mouth at bedtime.      DAYVIGO 10 MG TABS Take 1 tablet by mouth at bedtime.      diclofenac (VOLTAREN) 75 MG EC tablet Take 75 mg by mouth 2 (two) times daily.      doxepin (SINEQUAN) 50 MG capsule Take 100 mg by mouth at bedtime.      DULoxetine (CYMBALTA) 60 MG capsule Take 60 mg by mouth daily.      famotidine (PEPCID) 40 MG tablet Take 40 mg by mouth daily as needed for heartburn.      fluticasone (FLONASE) 50 MCG/ACT nasal spray Place 2 sprays into both nostrils at bedtime as needed for  allergies.      folic acid (FOLVITE) [id number] MCG tablet Take 800 mcg by mouth daily.      levothyroxine (SYNTHROID) 150 MCG tablet Take 150 mcg by mouth daily before breakfast.      lidocaine (LIDODERM) 5 % Place 3 patches onto the skin daily as needed (pain).       liothyronine (CYTOMEL) 5 MCG tablet Take 10 mcg by mouth daily.      lithium carbonate (ESKALITH) 450 MG CR tablet Take 450 mg by mouth at bedtime.      Melatonin 10 MG TABS Take 10 mg by mouth at bedtime.      Misc Natural Products (JOINT HEALTH PO) Take 1 tablet by mouth daily.      Multiple Vitamin (MULTIVITAMIN WITH MINERALS) TABS tablet Take 1 tablet by mouth daily.      ondansetron (ZOFRAN-ODT) 8 MG disintegrating tablet Take 8 mg by mouth every 8 (eight) hours as needed for nausea or vomiting.  Oxycodone HCl 20 MG TABS Take 20 mg by mouth 5 (five) times daily.      oxymetazoline (AFRIN) 0.05 % nasal spray Place 1 spray into both nostrils at bedtime. With Saline      pregabalin (LYRICA) 50 MG capsule Take 150 mg by mouth every morning.      REXULTI 3 MG TABS Take 3 mg by mouth daily.      sodium chloride (OCEAN) 0.65 % SOLN nasal spray Place 1 spray into both nostrils at bedtime. Use with afrin      SOOLANTRA 1 % CREA Apply 1 application topically daily as needed (rosacea).      valACYclovir (VALTREX) 1000 MG tablet Take 2,000 mg by mouth 2 (two) times daily as needed (outbreaks).   1    valsartan (DIOVAN) 80 MG tablet Take 80 mg by mouth daily.      VIIBRYD 40 MG TABS Take 40 mg by mouth daily.       Allergies  Allergen Reactions   Shellfish Allergy Hives    SHRIMP    Conjugated Estrogens Other (See Comments)    progesterone interacted with antidepressants   Lactose Intolerance (Gi) Diarrhea    GI Distress    Social History   Tobacco Use   Smoking status: Some Days    Packs/day: 0.50    Years: 21.00    Pack years: 10.50    Types: Cigarettes    Last attempt to quit: 09/01/2016    Years since quitting: 4.4    Smokeless tobacco: Former  Substance Use Topics   Alcohol use: No    Family History  Problem Relation Age of Onset   Colon polyps Mother    Mitral valve prolapse Mother    High Cholesterol Mother    Cardiomyopathy Brother      Review of Systems ROS: I have reviewed the patient's review of systems thoroughly and there are no positive responses as relates to the HPI.  Objective:  Physical Exam  Vital signs in last 24 hours:   Well-developed well-nourished patient in no acute distress. Alert and oriented x3 HEENT:within normal limits Cardiac: Regular rate and rhythm Pulmonary: Lungs clear to auscultation Abdomen: Soft and nontender.  Normal active bowel sounds  Musculoskeletal: (r knee: painful rom limited rom _instability  Labs: Recent Results (from the past 2160 hour(s))  Urinalysis, Routine w reflex microscopic Urine, Clean Catch     Status: Abnormal   Collection Time: 02/20/21 11:20 AM  Result Value Ref Range   Color, Urine STRAW (A) YELLOW   APPearance CLEAR CLEAR   Specific Gravity, Urine 1.002 (L) 1.005 - 1.030   pH 7.0 5.0 - 8.0   Glucose, UA NEGATIVE NEGATIVE mg/dL   Hgb urine dipstick NEGATIVE NEGATIVE   Bilirubin Urine NEGATIVE NEGATIVE   Ketones, ur NEGATIVE NEGATIVE mg/dL   Protein, ur NEGATIVE NEGATIVE mg/dL   Nitrite NEGATIVE NEGATIVE   Leukocytes,Ua NEGATIVE NEGATIVE    Comment: Performed at [facility] [address], [place], [place] [address]  Surgical pcr screen     Status: Abnormal   Collection Time: 02/20/21 11:20 AM   Specimen: Nasal Mucosa; Nasal Swab  Result Value Ref Range   MRSA, PCR NEGATIVE NEGATIVE   Staphylococcus aureus POSITIVE (A) NEGATIVE    Comment: (NOTE) The Xpert SA Assay (FDA approved for NASAL specimens in patients 38 years of age and older), is one component of a comprehensive surveillance program. It is not intended to diagnose infection nor to guide  or monitor treatment. Performed at [facility], [place] [address] [place], [place] [address]   Type and screen Order type and screen if day of surgery is less than 15 days from draw of preadmission visit or order morning of surgery if day of surgery is greater than 6 days from preadmission visit.     Status: None   Collection Time: 02/20/21 12:02 PM  Result Value Ref Range   ABO/RH(D) O NEG    Antibody Screen NEG    Sample Expiration 03/06/2021,2359    Extend sample reason      NO TRANSFUSIONS OR PREGNANCY IN THE PAST 3 MONTHS Performed at [facility], [place] [address] [place], [place] [address]   CBC WITH DIFFERENTIAL     Status: Abnormal   Collection Time: 02/20/21 12:15 PM  Result Value Ref Range   WBC 9.2 4.0 - 10.5 K/uL   RBC 5.47 (H) 3.87 - 5.11 MIL/uL   Hemoglobin 16.1 (H) 12.0 - 15.0 g/dL   HCT 49.5 (H) 36.0 - 46.0 %   MCV 90.5 80.0 - 100.0 fL   MCH 29.4 26.0 - 34.0 pg   MCHC 32.5 30.0 - 36.0 g/dL   RDW 13.3 11.5 - 15.5 %   Platelets 223 150 - 400 K/uL   nRBC 0.0 0.0 - 0.2 %   Neutrophils Relative % 70 %   Neutro Abs 6.4 1.7 - 7.7 K/uL   Lymphocytes Relative 18 %   Lymphs Abs 1.6 0.7 - 4.0 K/uL   Monocytes Relative 10 %   Monocytes Absolute 0.9 0.1 - 1.0 K/uL   Eosinophils Relative 2 %   Eosinophils Absolute 0.2 0.0 - 0.5 K/uL   Basophils Relative 0 %   Basophils Absolute 0.0 0.0 - 0.1 K/uL   Immature Granulocytes 0 %   Abs Immature Granulocytes 0.03 0.00 - 0.07 K/uL    Comment: Performed at [facility], [place] [address], [place], [place] [address]  Comprehensive metabolic panel     Status: Abnormal   Collection Time: 02/20/21 12:15 PM  Result Value Ref Range   Sodium 138 135 - 145 mmol/L   Potassium 4.4 3.5 - 5.1 mmol/L   Chloride 104 98 - 111 mmol/L   CO2 28 22 - 32 mmol/L   Glucose, Bld 102 (H) 70 - 99 mg/dL    Comment: Glucose reference range applies only to samples taken after fasting for at least 8 hours.   BUN 11 6 - 20 mg/dL   Creatinine, Ser  0.73 0.44 - 1.00 mg/dL   Calcium 9.5 8.9 - 10.3 mg/dL   Total Protein 7.3 6.5 - 8.1 g/dL   Albumin 4.1 3.5 - 5.0 g/dL   AST 26 15 - 41 U/L   ALT 26 0 - 44 U/L   Alkaline Phosphatase 81 38 - 126 U/L   Total Bilirubin 0.5 0.3 - 1.2 mg/dL   GFR, Estimated >60 >60 mL/min    Comment: (NOTE) Calculated using the CKD-EPI Creatinine Equation (2021)    Anion gap 6 5 - 15    Comment: Performed at [facility], [place] [address] [place],  [address]  Protime-INR     Status: None   Collection Time: 02/20/21 12:15 PM  Result Value Ref Range   Prothrombin Time 12.9 11.4 - 15.2 seconds   INR 1.0 0.8 - 1.2    Comment: (NOTE) INR goal varies based on device and disease states. Performed at [facility], [place] [address] [place], [place] [address]   APTT     Status: None   Collection Time: 02/20/21 12:15 PM  Result Value Ref Range   aPTT 34 24 - 36 seconds    Comment: Performed at [facility], [place] [address], [place], [place] [address]  SARS Coronavirus 2 (TAT 6-24 hrs)     Status: None   Collection Time: 02/26/21 12:00 AM  Result Value Ref Range   SARS Coronavirus 2 RESULT: NEGATIVE     Comment: RESULT: NEGATIVESARS-CoV-2 INTERPRETATION:A NEGATIVE  test result means that SARS-CoV-2 RNA was not present in the specimen above the limit of detection of this test. This does not preclude a possible SARS-CoV-2 infection and should not be used as the  sole basis for patient management decisions. Negative results must be combined with clinical observations, patient history, and epidemiological information. Optimum specimen types and timing for peak viral levels during infections caused by SARS-CoV-2  have not been determined. Collection of multiple specimens or types of specimens may be necessary to detect virus. Improper specimen collection and handling, sequence variability under primers/probes, or organism present below the limit of detection  may  lead to false negative results. Positive and negative predictive values of testing are highly dependent on prevalence. False negative test results are more likely when prevalence of disease is high.The expected result is NEGATIVE.Fact S heet for  Healthcare Providers: LocalChronicle.no Sheet for Patients: SalonLookup.es Reference Range - Negative      Estimated body mass index is 38.77 kg/m as calculated from the following:   Height as of 02/20/21: '5\' 8"'$  (1.727 m).   Weight as of 02/20/21: 115.7 kg.   Imaging Review Plain radiographs demonstrate severe degenerative joint disease of the right knee(s). The overall alignment ismild varus. The bone quality appears to be fair for age and reported activity level.      Assessment/Plan:  End stage arthritis, right knee   The patient history, physical examination, clinical judgment of the provider and imaging studies are consistent with end stage degenerative joint disease of the right knee(s) and total knee arthroplasty is deemed medically necessary. The treatment options including medical management, injection therapy arthroscopy and arthroplasty were discussed at length. The risks and benefits of total knee arthroplasty were presented and reviewed. The risks due to aseptic loosening, infection, stiffness, patella tracking problems, thromboembolic complications and other imponderables were discussed. The patient acknowledged the explanation, agreed to proceed with the plan and consent was signed. Patient is being admitted for inpatient treatment for surgery, pain control, PT, OT, prophylactic antibiotics, VTE prophylaxis, progressive ambulation and ADL's and discharge planning. The patient is planning to be discharged home with home health services     Patient's anticipated LOS is less than 2 midnights, meeting these requirements: - Younger than 35 - Lives within 1 hour of care -  Has a competent adult at home to recover with post-op recover - NO history of  - Chronic pain requiring opiods  - Diabetes  - Coronary Artery Disease  - Heart failure  - Heart attack  - Stroke  - DVT/VTE  - Cardiac arrhythmia  - Respiratory Failure/COPD  - Renal failure  - Anemia  - Advanced Liver disease

## 2021-02-28 NOTE — Progress Notes (Signed)
AssistedDr. Carolyn Witman with right, ultrasound guided, adductor canal block. Side rails up, monitors on throughout procedure. See vital signs in flow sheet. Tolerated Procedure well.  

## 2021-02-28 NOTE — Anesthesia Procedure Notes (Signed)
Anesthesia Regional Block: Adductor canal block   Pre-Anesthetic Checklist: , timeout performed,  Correct Patient, Correct Site, Correct Laterality,  Correct Procedure, Correct Position, site marked,  Risks and benefits discussed,  Surgical consent,  Pre-op evaluation,  At surgeon's request and post-op pain management  Laterality: Right  Prep: chloraprep       Needles:  Injection technique: Single-shot  Needle Type: Echogenic Stimulator Needle     Needle Length: 10cm  Needle Gauge: 20     Additional Needles:   Procedures:,,,, ultrasound used (permanent image in chart),,    Narrative:  Start time: 02/28/2021 9:11 AM End time: 02/28/2021 9:16 AM Injection made incrementally with aspirations every 5 mL.  Performed by: Personally  Anesthesiologist: Lidia Collum, MD  Additional Notes: Standard monitors applied. Skin prepped. Good needle visualization with ultrasound. Injection made in 5cc increments with no resistance to injection. Patient tolerated the procedure well.

## 2021-02-28 NOTE — Discharge Summary (Signed)
Patient ID: [name] MRN: [id number] DOB/AGE: 1971/06/06 50 y.o.  Admit date: 02/28/2021 Discharge date: 02/28/2021  Admission Diagnoses:  Principal Problem:   Primary osteoarthritis of right knee Active Problems:   Osteoarthritis of left knee   Discharge Diagnoses:  Same  Past Medical History:  Diagnosis Date   ADD (attention deficit disorder)    Anxiety    Breast mass 02/2014   Cervical pseudoarthrosis ([name])    Chronic back pain    Chronic constipation    Complication of anesthesia    after 07/01/2017 cervical c5-t2, pt states she had hallucination x 2 wks after surgery   Depression    Fibromyalgia    GERD (gastroesophageal reflux disease)    H/O cardiac radiofrequency ablation 1996   History of kidney stones    one lodged in right kidney - 3m    HSV infection    Hypertension    Hyperthyroidism    at on time - now hypothyroidism   Hypothyroidism    Neuromuscular disorder ([facility]    sciatica    PONV (postoperative nausea and vomiting)    Rheumatoid arthritis (H[facility])    OA- lumbar & cervical     Sleep apnea    CPAP used nightly   Wolff-Parkinson-White (WPW) syndrome    Wolff-Parkinson-White pattern 1994   had ablation surgery    Surgeries: Procedure(s): Right TOTAL KNEE ARTHROPLASTY, cortisone injection left knee on 02/28/2021     Discharged Condition: Improved  Hospital Course: Taylor Wallgrenis an 50y.o. female who was admitted 02/28/2021 for operative treatment ofPrimary osteoarthritis of right knee. Patient has severe unremitting pain that affects sleep, daily activities, and work/hobbies. After pre-op clearance the patient was taken to the operating room on 02/28/2021 and underwent  Procedure(s): Right TOTAL KNEE ARTHROPLASTY, cortisone injection left knee.    Patient was given perioperative antibiotics:  Anti-infectives (From admission, onward)    Start     Dose/Rate Route Frequency Ordered Stop   02/28/21 1600  ceFAZolin (ANCEF) IVPB  2g/100 mL premix        2 g 200 mL/hr over 30 Minutes Intravenous Every 6 hours 02/28/21 1402 03/01/21 0359   02/28/21 0817  ceFAZolin (ANCEF) 2-4 GM/100ML-% IVPB       Note to Pharmacy: [name]  : cabinet override      02/28/21 0817 02/28/21 2029   02/28/21 0815  ceFAZolin (ANCEF) IVPB 2g/100 mL premix        2 g 200 mL/hr over 30 Minutes Intravenous On call to O.R. 02/28/21 0804 02/28/21 1017        Patient was given sequential compression devices, early ambulation, and chemoprophylaxis to prevent DVT.  Patient benefited maximally from hospital stay and there were no complications.    Recent vital signs: Patient Vitals for the past 24 hrs:  BP Temp Temp src Pulse Resp SpO2 Height Weight  02/28/21 1552 130/74 97.8 F (36.6 C) Oral 70 18 93 % -- --  02/28/21 1358 125/78 97.7 F (36.5 C) Oral 78 -- 94 % -- --  02/28/21 1330 134/71 -- -- 84 10 94 % -- --  02/28/21 1315 137/69 -- -- 84 11 93 % -- --  02/28/21 1300 135/76 -- -- 92 13 91 % -- --  02/28/21 1245 (!) 161/65 98 F (36.7 C) -- 95 15 100 % -- --  02/28/21 1230 134/88 -- -- 98 11 96 % -- --  02/28/21 1215 (!) 142/91 97.6 F (36.4 C) -- 91  10 97 % -- --  02/28/21 0922 -- -- -- 70 16 96 % -- --  02/28/21 0921 -- -- -- 71 16 96 % -- --  02/28/21 0920 -- -- -- 67 13 97 % -- --  02/28/21 0919 119/60 -- -- 74 18 95 % -- --  02/28/21 0918 -- -- -- 66 13 96 % -- --  02/28/21 0917 -- -- -- 69 13 97 % -- --  02/28/21 0916 -- -- -- 75 14 97 % -- --  02/28/21 0915 -- -- -- 77 (!) 24 97 % -- --  02/28/21 0914 123/65 -- -- 71 19 98 % -- --  02/28/21 0913 -- -- -- 70 11 96 % -- --  02/28/21 0912 -- -- -- 72 15 96 % -- --  02/28/21 0911 118/70 -- -- 71 15 95 % -- --  02/28/21 0910 -- -- -- 74 16 94 % -- --  02/28/21 0909 108/69 -- -- 76 -- 95 % -- --  02/28/21 0908 -- -- -- 76 16 94 % -- --  02/28/21 0848 -- -- -- -- -- -- '5\' 8"'$  (1.727 m) 115.7 kg  02/28/21 0838 (!) 100/51 97.9 F (36.6 C) Oral 90 20 96 % -- --      Recent laboratory studies: No results for input(s): WBC, HGB, HCT, PLT, NA, K, CL, CO2, BUN, CREATININE, GLUCOSE, INR, CALCIUM in the last 72 hours.  Invalid input(s): PT, 2   Discharge Medications:   Allergies as of 02/28/2021       Reactions   Conjugated Estrogens Other (See Comments)   progesterone interacted with antidepressants   Lactose Intolerance (gi) Diarrhea   GI Distress        Medication List     STOP taking these medications    diclofenac 75 MG EC tablet Commonly known as: VOLTAREN       TAKE these medications    amphetamine-dextroamphetamine 30 MG tablet Commonly known as: ADDERALL Take 1 tablet by mouth 2 (two) times daily as needed (mental focus).   ASHWAGANDHA PO Take 3 capsules by mouth daily. 2100 mg   aspirin EC 325 MG tablet Take 1 tablet (325 mg total) by mouth 2 (two) times daily after a meal. Take x 1 month post op to decrease risk of blood clots.   b complex vitamins tablet Take 1 tablet by mouth daily.   clonazePAM 1 MG tablet Commonly known as: KLONOPIN Take 2 mg by mouth at bedtime.   DayVigo 10 MG Tabs Generic drug: Lemborexant Take 1 tablet by mouth at bedtime.   doxepin 50 MG capsule Commonly known as: SINEQUAN Take 100 mg by mouth at bedtime.   DULoxetine 60 MG capsule Commonly known as: CYMBALTA Take 60 mg by mouth daily.   famotidine 40 MG tablet Commonly known as: PEPCID Take 40 mg by mouth daily as needed for heartburn.   fluticasone 50 MCG/ACT nasal spray Commonly known as: FLONASE Place 2 sprays into both nostrils at bedtime as needed for allergies.   folic acid [id number] MCG tablet Commonly known as: FOLVITE Take 800 mcg by mouth daily.   furosemide 20 MG tablet Commonly known as: LASIX Take 20 mg by mouth daily as needed for fluid (swelling).   HYDROmorphone 2 MG tablet Commonly known as: Dilaudid Take 1-2 tablets (2-4 mg total) by mouth every 6 (six) hours as needed for severe pain (For breakthrough  pain.).   JOINT HEALTH PO Take 1 tablet by mouth  daily.   levothyroxine 150 MCG tablet Commonly known as: SYNTHROID Take 150 mcg by mouth daily before breakfast.   lidocaine 5 % Commonly known as: LIDODERM Place 3 patches onto the skin daily as needed (pain).   liothyronine 5 MCG tablet Commonly known as: CYTOMEL Take 10 mcg by mouth daily.   lithium carbonate 450 MG CR tablet Commonly known as: ESKALITH Take 450 mg by mouth at bedtime.   Melatonin 10 MG Tabs Take 10 mg by mouth at bedtime.   multivitamin with minerals Tabs tablet Take 1 tablet by mouth daily.   ondansetron 8 MG disintegrating tablet Commonly known as: ZOFRAN-ODT Take 8 mg by mouth every 8 (eight) hours as needed for nausea or vomiting.   Oxycodone HCl 20 MG Tabs Take 20 mg by mouth 5 (five) times daily.   oxymetazoline 0.05 % nasal spray Commonly known as: AFRIN Place 1 spray into both nostrils at bedtime. With Saline   polyethylene glycol 17 g packet Commonly known as: MIRALAX / GLYCOLAX Take 17 g by mouth daily.   pregabalin 50 MG capsule Commonly known as: LYRICA Take 50 mg by mouth every 6 (six) hours.   Rexulti 3 MG Tabs Generic drug: Brexpiprazole Take 3 mg by mouth daily.   tiZANidine 4 MG tablet Commonly known as: ZANAFLEX Take 4 mg by mouth every 8 (eight) hours as needed for muscle spasms.   valACYclovir 1000 MG tablet Commonly known as: VALTREX Take 2,000 mg by mouth 2 (two) times daily as needed (outbreaks).   valsartan 80 MG tablet Commonly known as: DIOVAN Take 80 mg by mouth daily.   Viibryd 40 MG Tabs Generic drug: Vilazodone HCl Take 40 mg by mouth daily.   Vitamin D-3 125 MCG (5000 UT) Tabs Take 5,000 Units by mouth daily.               Durable Medical Equipment  (From admission, onward)           Start     Ordered   02/28/21 1403  DME Walker rolling  Once       Question:  Patient needs a walker to treat with the following condition  Answer:   Primary osteoarthritis of right knee   02/28/21 1402   02/28/21 1403  DME 3 n 1  Once        02/28/21 1402              Discharge Care Instructions  (From admission, onward)           Start     Ordered   02/28/21 0000  Weight bearing as tolerated       Question Answer Comment  Laterality right   Extremity Lower      02/28/21 1738   02/28/21 0000  Discharge wound care:       Comments: If you have a hip bandage, keep it clean and dry.  Change your bandage as instructed by your health care providers.  If your bandage has been discontinued, keep your incision clean and dry.  Pat dry after bathing.  DO NOT put lotion or powder on your incision.   02/28/21 1738            Diagnostic Studies: DG Chest 2 View  Result Date: 02/20/2021 CLINICAL DATA:  Preop for right knee surgery. EXAM: CHEST - 2 VIEW COMPARISON:  April 06, 2019. FINDINGS: The heart size and mediastinal contours are within normal limits. Both lungs are clear. The visualized skeletal  structures are unremarkable. IMPRESSION: No active cardiopulmonary disease. Electronically Signed   By: Marijo Conception M.D.   On: 02/20/2021 17:55   VAS [place] LOWER EXTREMITY VENOUS (DVT)  Result Date: 02/25/2021  Lower Venous DVT Study Patient Name:  ANNICK PINGITORE  Date of Exam:   02/25/2021 Medical Rec #: [id number]               Accession #:    [id number] Date of Birth: Jun 27, 1971               Patient Gender: F Patient Age:   11 years Exam Location:  [facility] Procedure:      VAS [place] LOWER EXTREMITY VENOUS (DVT) Referring Phys: Juanda Crumble ROSS --------------------------------------------------------------------------------  Indications: Pain and swelling LT calf/ankle.  Limitations: Patient unable to tolerate lying flat. Comparison       02-28-2019 Prior left lower extremity venous was negative for Study:           DVT. Performing Technologist: Darlin Coco RDMS, RVT  Examination Guidelines: A complete evaluation includes  B-mode imaging, spectral Doppler, color Doppler, and power Doppler as needed of all accessible portions of each vessel. Bilateral testing is considered an integral part of a complete examination. Limited examinations for reoccurring indications may be performed as noted. The reflux portion of the exam is performed with the patient in reverse Trendelenburg.  +---------+---------------+---------+-----------+----------+--------------+ LEFT     CompressibilityPhasicitySpontaneityPropertiesThrombus Aging +---------+---------------+---------+-----------+----------+--------------+ CFV      Full           Yes      Yes                                 +---------+---------------+---------+-----------+----------+--------------+ SFJ      Full                                                        +---------+---------------+---------+-----------+----------+--------------+ FV Prox  Full                                                        +---------+---------------+---------+-----------+----------+--------------+ FV Mid   Full                                                        +---------+---------------+---------+-----------+----------+--------------+ FV DistalFull                                                        +---------+---------------+---------+-----------+----------+--------------+ PFV      Full                                                        +---------+---------------+---------+-----------+----------+--------------+  POP      Full           Yes      Yes                                 +---------+---------------+---------+-----------+----------+--------------+ PTV      Full                                                        +---------+---------------+---------+-----------+----------+--------------+ PERO     Full                                                         +---------+---------------+---------+-----------+----------+--------------+ Gastroc  Full                                                        +---------+---------------+---------+-----------+----------+--------------+ SSV      Full                                                        +---------+---------------+---------+-----------+----------+--------------+     Summary: LEFT: - There is no evidence of deep vein thrombosis in the lower extremity.  - No cystic structure found in the popliteal fossa.  - Hypoechoic area identified within the muscle fascia at the distal calf/area of most pain of unknown etiology.  *See table(s) above for measurements and observations. Electronically signed by Deitra Mayo MD on 02/25/2021 at 10:44:06 AM.    Final     Disposition: Discharge disposition: 01-Home or Self Care      Discharge Instructions     Call MD / Call 911   Complete by: As directed    If you experience chest pain or shortness of breath, CALL 911 and be transported to the hospital emergency room.  If you develope a fever above 101 F, pus (white drainage) or increased drainage or redness at the wound, or calf pain, call your surgeon's office.   Constipation Prevention   Complete by: As directed    Drink plenty of fluids.  Prune juice may be helpful.  You may use a stool softener, such as Colace (over the counter) 100 mg twice a day.  Use MiraLax (over the counter) for constipation as needed.   Diet general   Complete by: As directed    Discharge wound care:   Complete by: As directed    If you have a hip bandage, keep it clean and dry.  Change your bandage as instructed by your health care providers.  If your bandage has been discontinued, keep your incision clean and dry.  Pat dry after bathing.  DO NOT put lotion or powder on your incision.   Do not put a pillow under the knee. Place it under the heel.  Complete by: As directed    Do not sit on low chairs, stoools or  toilet seats, as it may be difficult to get up from low surfaces   Complete by: As directed    Follow the hip precautions as taught in Physical Therapy   Complete by: As directed    Increase activity slowly as tolerated   Complete by: As directed    Post-operative opioid taper instructions:   Complete by: As directed    POST-OPERATIVE OPIOID TAPER INSTRUCTIONS: It is important to wean off of your opioid medication as soon as possible. If you do not need pain medication after your surgery it is ok to stop day one. Opioids include: Codeine, Hydrocodone(Norco, Vicodin), Oxycodone(Percocet, oxycontin) and hydromorphone amongst others.  Long term and even short term use of opiods can cause: Increased pain response Dependence Constipation Depression Respiratory depression And more.  Withdrawal symptoms can include Flu like symptoms Nausea, vomiting And more Techniques to manage these symptoms Hydrate well Eat regular healthy meals Stay active Use relaxation techniques(deep breathing, meditating, yoga) Do Not substitute Alcohol to help with tapering If you have been on opioids for less than two weeks and do not have pain than it is ok to stop all together.  Plan to wean off of opioids This plan should start within one week post op of your joint replacement. Maintain the same interval or time between taking each dose and first decrease the dose.  Cut the total daily intake of opioids by one tablet each day Next start to increase the time between doses. The last dose that should be eliminated is the evening dose.      Weight bearing as tolerated   Complete by: As directed    Laterality: right   Extremity: Lower        Follow-up Information     Dorna Leitz, MD. Schedule an appointment as soon as possible for a visit in 2 week(s).   Specialty: Orthopedic Surgery Contact information: [place] [address] [phone]                  Signed: Erlene Senters 02/28/2021, 5:43 PM

## 2021-02-28 NOTE — Plan of Care (Signed)
  Problem: Pain Management: Goal: Pain level will decrease with appropriate interventions Outcome: Progressing   

## 2021-02-28 NOTE — Op Note (Signed)
PATIENT ID:      [name]  MRN:     [id number] DOB/AGE:    [birth year] / 50 y.o.       OPERATIVE REPORT   DATE OF PROCEDURE:  02/28/2021      PREOPERATIVE DIAGNOSIS:   RIGHT KNEE DEGENERATIVE JOINT DISEASE      Estimated body mass index is 38.78 kg/m as calculated from the following:   Height as of this encounter: '5\' 8"'$  (1.727 m).   Weight as of this encounter: 115.7 kg.                                                       POSTOPERATIVE DIAGNOSIS:   Same                                                                  PROCEDURE:  Procedure(s): TOTAL KNEE ARTHROPLASTY, cortisone injection left knee Using DepuyAttune RP implants #6N Femur, #5Tibia, 8 mm Attune RP bearing, 38 Patella    SURGEON: Alta Corning  ASSISTANT:   jim Bethune PA-C   (Present and scrubbed throughout the case, critical for assistance with exposure, retraction, instrumentation, and closure.)        ANESTHESIA: spinal, 20cc Exparel, 50cc 0.25% Marcaine EBL: min cc FLUID REPLACEMENT: unk cc crystaloid TOURNIQUET: DRAINS: None TRANEXAMIC ACID: 1gm IV, 2gm topical COMPLICATIONS:  None         INDICATIONS FOR PROCEDURE: The patient has  RIGHT KNEE DEGENERATIVE JOINT DISEASE, mild varus deformities, XR shows bone on bone arthritis, lateral subluxation of tibia. Patient has failed all conservative measures including anti-inflammatory medicines, narcotics, attempts at exercise and weight loss, cortisone injections and viscosupplementation.  Risks and benefits of surgery have been discussed, questions answered.   DESCRIPTION OF PROCEDURE: The patient identified by armband, received  IV antibiotics, in the holding area at [facility]. Patient taken to the operating room, appropriate anesthetic monitors were attached, and spinal anesthesia was  induced. IV Tranexamic acid was given.Tourniquet applied high to the operative thigh. Lateral post and foot positioner applied to the table, the lower extremity was  then prepped and draped in usual sterile fashion from the toes to the tourniquet. Time-out procedure was performed. Zenda Alpers, was present and scrubbed throughout the case, critical for assistance with, positioning, exposure, retraction, instrumentation, and closure.The skin and subcutaneous tissue along the incision was injected with 20 cc of a mixture of Exparel and Marcaine solution, using a 20-gauge by 1-1/2 inch needle. We began the operation, with the knee flexed 130 degrees, by making the anterior midline incision starting at handbreadth above the patella going over the patella 1 cm medial to and 4 cm distal to the tibial tubercle. Small bleeders in the skin and the subcutaneous tissue identified and cauterized. Transverse retinaculum was incised and reflected medially and a medial parapatellar arthrotomy was accomplished. the patella was everted and theprepatellar fat pad resected. The superficial medial collateral ligament was then elevated from anterior to posterior along the proximal flare of the tibia and anterior half of the menisci resected. The knee was hyperflexed exposing  bone on bone arthritis. Peripheral and notch osteophytes as well as the cruciate ligaments were then resected. We continued to work our way around posteriorly along the proximal tibia, and externally rotated the tibia subluxing it out from underneath the femur. A McHale PCL retractor was placed through the notch and a lateral Hohmann retractor placed, and we then entered the proximal tibia in line with the Depuy starter drill in line with the axis of the tibia followed by an intramedullary guide rod and 0-degree posterior slope cutting guide. The tibial cutting guide, 4 degree posterior sloped, was pinned into place allowing resection of 2 mm of bone medially and 10 mm of bone laterally. Satisfied with the tibial resection, we then entered the distal femur 2 mm anterior to the PCL origin with the intramedullary guide rod and  applied the distal femoral cutting guide set at 9 mm, with 5 degrees of valgus. This was pinned along the epicondylar axis. At this point, the distal femoral cut was accomplished without difficulty. We then sized for a #6N femoral component and pinned the guide in 3 degrees of external rotation. The chamfer cutting guide was pinned into place. The anterior, posterior, and chamfer cuts were accomplished without difficulty followed by the Attune RP box cutting guide and the box cut. We also removed posterior osteophytes from the posterior femoral condyles. The posterior capsule was injected with Exparel solution. The knee was brought into full extension. We checked our extension gap and fit a 8 mm bearing. Distracting in extension with a lamina spreader,  bleeders in the posterior capsule, Posterior medial and posterior lateral gutter were cauterized.  The transexamic acid-soaked sponge was then placed in the gap of the knee in extension. The knee was flexed 30. The posterior patella cut was accomplished with the 9.5 mm Attune cutting guide, sized for a 77m dome, and the fixation pegs drilled.The knee was then once again hyperflexed exposing the proximal tibia. We sized for a # 5 tibial base plate, applied the smokestack and the conical reamer followed by the the Delta fin keel punch. We then hammered into place the Attune RP trial femoral component, drilled the lugs, inserted a  8 mm trial bearing, trial patellar button, and took the knee through range of motion from 0-130 degrees. Medial and lateral ligamentous stability was checked. No thumb pressure was required for patellar Tracking. The tourniquet was '@52'$  min. All trial components were removed, mating surfaces irrigated with pulse lavage, and dried with suction and sponges. 10 cc of the Exparel solution was applied to the cancellus bone of the patella distal femur and proximal tibia.  After waiting 30 seconds, the bony surfaces were again, dried with sponges.  A double batch of DePuy HV cement was mixed and applied to all bony metallic mating surfaces except for the posterior condyles of the femur itself. In order, we hammered into place the tibial tray and removed excess cement, the femoral component and removed excess cement. The final Attune RP bearing was inserted, and the knee brought to full extension with compression. The patellar button was clamped into place, and excess cement removed. The knee was held at 30 flexion with compression, while the cement cured. The wound was irrigated out with normal saline solution pulse lavage. The rest of the Exparel was injected into the parapatellar arthrotomy, subcutaneous tissues, and periosteal tissues. The parapatellar arthrotomy was closed with running #1 Vicryl suture. The subcutaneous tissue with 3-0 undyed Vicryl suture, and the skin with running 3-0  SQ vicryl. An Aquacil and Ace wrap were applied. The patient was taken to recovery room without difficulty.   Alta Corning 02/28/2021, 11:52 AM

## 2021-02-28 NOTE — Progress Notes (Signed)
Orthopedic Tech Progress Note Patient Details:  [name] June 16, 1971 [id number]  Patient ID: [name], female   DOB: Jun 25, 1971, 50 y.o.   MRN: [id number]  [name] 02/28/2021, 2:44 PM Cpm applied to right leg in room

## 2021-03-02 DIAGNOSIS — I491 Atrial premature depolarization: Secondary | ICD-10-CM | POA: Diagnosis not present

## 2021-03-02 DIAGNOSIS — F32A Depression, unspecified: Secondary | ICD-10-CM | POA: Diagnosis not present

## 2021-03-02 DIAGNOSIS — Z471 Aftercare following joint replacement surgery: Secondary | ICD-10-CM | POA: Diagnosis not present

## 2021-03-02 DIAGNOSIS — K5909 Other constipation: Secondary | ICD-10-CM | POA: Diagnosis not present

## 2021-03-02 DIAGNOSIS — I456 Pre-excitation syndrome: Secondary | ICD-10-CM | POA: Diagnosis not present

## 2021-03-02 DIAGNOSIS — Z87891 Personal history of nicotine dependence: Secondary | ICD-10-CM | POA: Diagnosis not present

## 2021-03-02 DIAGNOSIS — I10 Essential (primary) hypertension: Secondary | ICD-10-CM | POA: Diagnosis not present

## 2021-03-02 DIAGNOSIS — G8929 Other chronic pain: Secondary | ICD-10-CM | POA: Diagnosis not present

## 2021-03-02 DIAGNOSIS — M1712 Unilateral primary osteoarthritis, left knee: Secondary | ICD-10-CM | POA: Diagnosis not present

## 2021-03-02 DIAGNOSIS — K219 Gastro-esophageal reflux disease without esophagitis: Secondary | ICD-10-CM | POA: Diagnosis not present

## 2021-03-02 DIAGNOSIS — E7849 Other hyperlipidemia: Secondary | ICD-10-CM | POA: Diagnosis not present

## 2021-03-02 DIAGNOSIS — F909 Attention-deficit hyperactivity disorder, unspecified type: Secondary | ICD-10-CM | POA: Diagnosis not present

## 2021-03-02 DIAGNOSIS — M069 Rheumatoid arthritis, unspecified: Secondary | ICD-10-CM | POA: Diagnosis not present

## 2021-03-02 DIAGNOSIS — G4733 Obstructive sleep apnea (adult) (pediatric): Secondary | ICD-10-CM | POA: Diagnosis not present

## 2021-03-02 DIAGNOSIS — F419 Anxiety disorder, unspecified: Secondary | ICD-10-CM | POA: Diagnosis not present

## 2021-03-02 DIAGNOSIS — M797 Fibromyalgia: Secondary | ICD-10-CM | POA: Diagnosis not present

## 2021-03-04 ENCOUNTER — Encounter (HOSPITAL_COMMUNITY): Payer: Self-pay | Admitting: Orthopedic Surgery

## 2021-03-04 DIAGNOSIS — F32A Depression, unspecified: Secondary | ICD-10-CM | POA: Diagnosis not present

## 2021-03-04 DIAGNOSIS — I10 Essential (primary) hypertension: Secondary | ICD-10-CM | POA: Diagnosis not present

## 2021-03-04 DIAGNOSIS — K5909 Other constipation: Secondary | ICD-10-CM | POA: Diagnosis not present

## 2021-03-04 DIAGNOSIS — M069 Rheumatoid arthritis, unspecified: Secondary | ICD-10-CM | POA: Diagnosis not present

## 2021-03-04 DIAGNOSIS — F909 Attention-deficit hyperactivity disorder, unspecified type: Secondary | ICD-10-CM | POA: Diagnosis not present

## 2021-03-04 DIAGNOSIS — E7849 Other hyperlipidemia: Secondary | ICD-10-CM | POA: Diagnosis not present

## 2021-03-04 DIAGNOSIS — K219 Gastro-esophageal reflux disease without esophagitis: Secondary | ICD-10-CM | POA: Diagnosis not present

## 2021-03-04 DIAGNOSIS — I491 Atrial premature depolarization: Secondary | ICD-10-CM | POA: Diagnosis not present

## 2021-03-04 DIAGNOSIS — I456 Pre-excitation syndrome: Secondary | ICD-10-CM | POA: Diagnosis not present

## 2021-03-04 DIAGNOSIS — F419 Anxiety disorder, unspecified: Secondary | ICD-10-CM | POA: Diagnosis not present

## 2021-03-04 DIAGNOSIS — Z471 Aftercare following joint replacement surgery: Secondary | ICD-10-CM | POA: Diagnosis not present

## 2021-03-04 DIAGNOSIS — Z87891 Personal history of nicotine dependence: Secondary | ICD-10-CM | POA: Diagnosis not present

## 2021-03-04 DIAGNOSIS — M1712 Unilateral primary osteoarthritis, left knee: Secondary | ICD-10-CM | POA: Diagnosis not present

## 2021-03-04 DIAGNOSIS — G4733 Obstructive sleep apnea (adult) (pediatric): Secondary | ICD-10-CM | POA: Diagnosis not present

## 2021-03-04 DIAGNOSIS — G8929 Other chronic pain: Secondary | ICD-10-CM | POA: Diagnosis not present

## 2021-03-04 DIAGNOSIS — M797 Fibromyalgia: Secondary | ICD-10-CM | POA: Diagnosis not present

## 2021-03-06 ENCOUNTER — Other Ambulatory Visit (HOSPITAL_COMMUNITY): Payer: Self-pay | Admitting: Orthopedic Surgery

## 2021-03-06 ENCOUNTER — Ambulatory Visit (HOSPITAL_COMMUNITY)
Admission: RE | Admit: 2021-03-06 | Discharge: 2021-03-06 | Disposition: A | Discharge status: Home / Self Care | Payer: BC Managed Care – PPO | Source: Ambulatory Visit | Attending: Orthopedic Surgery | Admitting: Orthopedic Surgery

## 2021-03-06 ENCOUNTER — Other Ambulatory Visit: Payer: Self-pay

## 2021-03-06 ENCOUNTER — Other Ambulatory Visit (HOSPITAL_COMMUNITY): Payer: BC Managed Care – PPO

## 2021-03-06 DIAGNOSIS — M797 Fibromyalgia: Secondary | ICD-10-CM | POA: Diagnosis not present

## 2021-03-06 DIAGNOSIS — I491 Atrial premature depolarization: Secondary | ICD-10-CM | POA: Diagnosis not present

## 2021-03-06 DIAGNOSIS — I456 Pre-excitation syndrome: Secondary | ICD-10-CM | POA: Diagnosis not present

## 2021-03-06 DIAGNOSIS — M7989 Other specified soft tissue disorders: Secondary | ICD-10-CM

## 2021-03-06 DIAGNOSIS — M069 Rheumatoid arthritis, unspecified: Secondary | ICD-10-CM | POA: Diagnosis not present

## 2021-03-06 DIAGNOSIS — G8929 Other chronic pain: Secondary | ICD-10-CM | POA: Diagnosis not present

## 2021-03-06 DIAGNOSIS — M79661 Pain in right lower leg: Secondary | ICD-10-CM | POA: Diagnosis not present

## 2021-03-06 DIAGNOSIS — F909 Attention-deficit hyperactivity disorder, unspecified type: Secondary | ICD-10-CM | POA: Diagnosis not present

## 2021-03-06 DIAGNOSIS — I10 Essential (primary) hypertension: Secondary | ICD-10-CM | POA: Diagnosis not present

## 2021-03-06 DIAGNOSIS — E7849 Other hyperlipidemia: Secondary | ICD-10-CM | POA: Diagnosis not present

## 2021-03-06 DIAGNOSIS — Z471 Aftercare following joint replacement surgery: Secondary | ICD-10-CM | POA: Diagnosis not present

## 2021-03-06 DIAGNOSIS — F32A Depression, unspecified: Secondary | ICD-10-CM | POA: Diagnosis not present

## 2021-03-06 DIAGNOSIS — Z87891 Personal history of nicotine dependence: Secondary | ICD-10-CM | POA: Diagnosis not present

## 2021-03-06 DIAGNOSIS — K5909 Other constipation: Secondary | ICD-10-CM | POA: Diagnosis not present

## 2021-03-06 DIAGNOSIS — G4733 Obstructive sleep apnea (adult) (pediatric): Secondary | ICD-10-CM | POA: Diagnosis not present

## 2021-03-06 DIAGNOSIS — M1712 Unilateral primary osteoarthritis, left knee: Secondary | ICD-10-CM | POA: Diagnosis not present

## 2021-03-06 DIAGNOSIS — K219 Gastro-esophageal reflux disease without esophagitis: Secondary | ICD-10-CM | POA: Diagnosis not present

## 2021-03-06 DIAGNOSIS — F419 Anxiety disorder, unspecified: Secondary | ICD-10-CM | POA: Diagnosis not present

## 2021-03-07 DIAGNOSIS — G4733 Obstructive sleep apnea (adult) (pediatric): Secondary | ICD-10-CM | POA: Diagnosis not present

## 2021-03-07 DIAGNOSIS — E7849 Other hyperlipidemia: Secondary | ICD-10-CM | POA: Diagnosis not present

## 2021-03-07 DIAGNOSIS — Z87891 Personal history of nicotine dependence: Secondary | ICD-10-CM | POA: Diagnosis not present

## 2021-03-07 DIAGNOSIS — M069 Rheumatoid arthritis, unspecified: Secondary | ICD-10-CM | POA: Diagnosis not present

## 2021-03-07 DIAGNOSIS — F909 Attention-deficit hyperactivity disorder, unspecified type: Secondary | ICD-10-CM | POA: Diagnosis not present

## 2021-03-07 DIAGNOSIS — M1712 Unilateral primary osteoarthritis, left knee: Secondary | ICD-10-CM | POA: Diagnosis not present

## 2021-03-07 DIAGNOSIS — I491 Atrial premature depolarization: Secondary | ICD-10-CM | POA: Diagnosis not present

## 2021-03-07 DIAGNOSIS — K5909 Other constipation: Secondary | ICD-10-CM | POA: Diagnosis not present

## 2021-03-07 DIAGNOSIS — F419 Anxiety disorder, unspecified: Secondary | ICD-10-CM | POA: Diagnosis not present

## 2021-03-07 DIAGNOSIS — I456 Pre-excitation syndrome: Secondary | ICD-10-CM | POA: Diagnosis not present

## 2021-03-07 DIAGNOSIS — G8929 Other chronic pain: Secondary | ICD-10-CM | POA: Diagnosis not present

## 2021-03-07 DIAGNOSIS — F32A Depression, unspecified: Secondary | ICD-10-CM | POA: Diagnosis not present

## 2021-03-07 DIAGNOSIS — I10 Essential (primary) hypertension: Secondary | ICD-10-CM | POA: Diagnosis not present

## 2021-03-07 DIAGNOSIS — K219 Gastro-esophageal reflux disease without esophagitis: Secondary | ICD-10-CM | POA: Diagnosis not present

## 2021-03-07 DIAGNOSIS — M797 Fibromyalgia: Secondary | ICD-10-CM | POA: Diagnosis not present

## 2021-03-07 DIAGNOSIS — Z471 Aftercare following joint replacement surgery: Secondary | ICD-10-CM | POA: Diagnosis not present

## 2021-03-11 DIAGNOSIS — M1712 Unilateral primary osteoarthritis, left knee: Secondary | ICD-10-CM | POA: Diagnosis not present

## 2021-03-11 DIAGNOSIS — I456 Pre-excitation syndrome: Secondary | ICD-10-CM | POA: Diagnosis not present

## 2021-03-11 DIAGNOSIS — M797 Fibromyalgia: Secondary | ICD-10-CM | POA: Diagnosis not present

## 2021-03-11 DIAGNOSIS — Z471 Aftercare following joint replacement surgery: Secondary | ICD-10-CM | POA: Diagnosis not present

## 2021-03-11 DIAGNOSIS — M069 Rheumatoid arthritis, unspecified: Secondary | ICD-10-CM | POA: Diagnosis not present

## 2021-03-11 DIAGNOSIS — F909 Attention-deficit hyperactivity disorder, unspecified type: Secondary | ICD-10-CM | POA: Diagnosis not present

## 2021-03-11 DIAGNOSIS — I10 Essential (primary) hypertension: Secondary | ICD-10-CM | POA: Diagnosis not present

## 2021-03-11 DIAGNOSIS — K219 Gastro-esophageal reflux disease without esophagitis: Secondary | ICD-10-CM | POA: Diagnosis not present

## 2021-03-11 DIAGNOSIS — G8929 Other chronic pain: Secondary | ICD-10-CM | POA: Diagnosis not present

## 2021-03-11 DIAGNOSIS — K5909 Other constipation: Secondary | ICD-10-CM | POA: Diagnosis not present

## 2021-03-11 DIAGNOSIS — G4733 Obstructive sleep apnea (adult) (pediatric): Secondary | ICD-10-CM | POA: Diagnosis not present

## 2021-03-11 DIAGNOSIS — F32A Depression, unspecified: Secondary | ICD-10-CM | POA: Diagnosis not present

## 2021-03-11 DIAGNOSIS — Z87891 Personal history of nicotine dependence: Secondary | ICD-10-CM | POA: Diagnosis not present

## 2021-03-11 DIAGNOSIS — E7849 Other hyperlipidemia: Secondary | ICD-10-CM | POA: Diagnosis not present

## 2021-03-11 DIAGNOSIS — I491 Atrial premature depolarization: Secondary | ICD-10-CM | POA: Diagnosis not present

## 2021-03-11 DIAGNOSIS — F419 Anxiety disorder, unspecified: Secondary | ICD-10-CM | POA: Diagnosis not present

## 2021-03-13 DIAGNOSIS — Z96651 Presence of right artificial knee joint: Secondary | ICD-10-CM | POA: Diagnosis not present

## 2021-03-13 DIAGNOSIS — M25661 Stiffness of right knee, not elsewhere classified: Secondary | ICD-10-CM | POA: Diagnosis not present

## 2021-03-13 DIAGNOSIS — R531 Weakness: Secondary | ICD-10-CM | POA: Diagnosis not present

## 2021-03-17 DIAGNOSIS — Z96651 Presence of right artificial knee joint: Secondary | ICD-10-CM | POA: Diagnosis not present

## 2021-03-17 DIAGNOSIS — R531 Weakness: Secondary | ICD-10-CM | POA: Diagnosis not present

## 2021-03-17 DIAGNOSIS — M25661 Stiffness of right knee, not elsewhere classified: Secondary | ICD-10-CM | POA: Diagnosis not present

## 2021-03-19 DIAGNOSIS — M25661 Stiffness of right knee, not elsewhere classified: Secondary | ICD-10-CM | POA: Diagnosis not present

## 2021-03-19 DIAGNOSIS — Z96651 Presence of right artificial knee joint: Secondary | ICD-10-CM | POA: Diagnosis not present

## 2021-03-19 DIAGNOSIS — R531 Weakness: Secondary | ICD-10-CM | POA: Diagnosis not present

## 2021-03-21 ENCOUNTER — Ambulatory Visit (INDEPENDENT_AMBULATORY_CARE_PROVIDER_SITE_OTHER): Payer: BC Managed Care – PPO | Admitting: Podiatrist

## 2021-03-21 ENCOUNTER — Encounter: Payer: Self-pay | Admitting: Podiatrist

## 2021-03-21 ENCOUNTER — Other Ambulatory Visit: Payer: Self-pay

## 2021-03-21 DIAGNOSIS — M25661 Stiffness of right knee, not elsewhere classified: Secondary | ICD-10-CM | POA: Diagnosis not present

## 2021-03-21 DIAGNOSIS — Z96651 Presence of right artificial knee joint: Secondary | ICD-10-CM | POA: Diagnosis not present

## 2021-03-21 DIAGNOSIS — L84 Corns and callosities: Secondary | ICD-10-CM

## 2021-03-21 DIAGNOSIS — L600 Ingrowing nail: Secondary | ICD-10-CM | POA: Diagnosis not present

## 2021-03-21 DIAGNOSIS — R531 Weakness: Secondary | ICD-10-CM | POA: Diagnosis not present

## 2021-03-21 MED ORDER — UREA 40 % EX CREA: 1.0000 "application " | TOPICAL_CREAM | Freq: Two times a day (BID) | CUTANEOUS | 2 refills | Status: DC

## 2021-03-21 MED ORDER — AMOXICILLIN-POT CLAVULANATE 875-125 MG PO TABS: 1.0000 | ORAL_TABLET | Freq: Two times a day (BID) | ORAL | 0 refills | Status: DC

## 2021-03-21 NOTE — Patient Instructions (Signed)
Soak Instructions    THE DAY AFTER THE PROCEDURE  Place 1/4 cup of epsom salts in a quart of warm tap water.  Submerge your foot or feet with outer bandage intact for the initial soak; this will allow the bandage to become moist and wet for easy lift off.  Once you remove your bandage, continue to soak in the solution for 20 minutes.  This soak should be done twice a day.  Next, remove your foot or feet from solution, blot dry the affected area and cover.  Apply polysporin or neosporin.   You may use a band aid large enough to cover the area or use gauze and tape.     IF YOUR SKIN BECOMES IRRITATED WHILE USING THESE INSTRUCTIONS, IT IS OKAY TO SWITCH TO  antibacterial soap pump soap (Dial)  and water to keep the toe clean instead of soaking in epsom salts.   [place] Instructions-Post Nail Surgery  You have had your ingrown toenail and root treated with a chemical.  This chemical causes a burn that will drain and ooze like a blister.  This can drain for 6-8 weeks or longer.  It is important to keep this area clean, covered, and follow the soaking instructions dispensed at the time of your surgery.  This area will eventually dry and form a scab.  Once the scab forms you no longer need to soak or apply a dressing.  If at any time you experience an increase in pain, redness, swelling, or drainage, you should contact the office as soon as possible.

## 2021-03-21 NOTE — Progress Notes (Signed)
Chief Complaint  Patient presents with   Nail Problem    Left hallux nail is sore and tender to the touch      HPI: Patient is 50 y.o. female who presents today for a painful left hallux nail on the medial side that is sore and tender to the touch.  She recently had a pedicure and was told there was some skin on the edge that was causing the issue.  She also just had a right knee replacement 3 weeks ago.  She is doing well from this surgery.    Patient Active Problem List   Diagnosis Date Noted   Primary osteoarthritis of right knee 02/28/2021   Osteoarthritis of left knee 02/28/2021   S/P lumbar fusion 04/12/2019   Herpes simplex 12/12/2018   Insomnia 12/12/2018   Adiposity 12/12/2018   Allergic rhinitis 12/12/2018   Arthralgia of hand 12/12/2018   Avitaminosis D 12/12/2018   Breast lump 12/12/2018   Degenerative arthritis of temporomandibular joint 12/12/2018   Disc disorder 12/12/2018   Female infertility 12/12/2018   Hypothyroidism, postop 12/12/2018   Inflammation of sacroiliac joint ([place]) 12/12/2018   Inflammatory polyarthritis ([place]) 12/12/2018   Neuralgia neuritis, sciatic nerve 12/12/2018   Cervical pain 12/12/2018   Spasm 12/12/2018   Lateral epicondylitis of right elbow 08/30/2018   Other hyperlipidemia 08/25/2018   S/P TAH (total abdominal hysterectomy) 04/06/2018   Uterine leiomyoma 03/30/2018   S/P cervical spinal fusion 07/01/2017   S/P lumbar laminectomy 09/02/2016   PVC's (premature ventricular contractions) 03/26/2015   Shortness of breath 03/26/2015   Hypothyroidism 02/27/2015   PAC (premature atrial contraction) 02/27/2015   Palpitation 02/27/2015   WPW (Wolff-Parkinson-White syndrome) 12/13/2014   Chronic back pain 12/13/2014   Dysfunction, labyrinthine 09/21/2013    Current Outpatient Medications on File Prior to Visit  Medication Sig Dispense Refill   amphetamine-dextroamphetamine (ADDERALL) 30 MG tablet Take 1 tablet by mouth 2 (two) times daily  as needed (mental focus).     ASHWAGANDHA PO Take 3 capsules by mouth daily. 2100 mg     aspirin EC 325 MG tablet Take 1 tablet (325 mg total) by mouth 2 (two) times daily after a meal. Take x 1 month post op to decrease risk of blood clots. 60 tablet 0   b complex vitamins tablet Take 1 tablet by mouth daily.     Cholecalciferol (VITAMIN D-3) 125 MCG (5000 UT) TABS Take 5,000 Units by mouth daily.     clonazePAM (KLONOPIN) 1 MG tablet Take 2 mg by mouth at bedtime.     DAYVIGO 10 MG TABS Take 1 tablet by mouth at bedtime.     doxepin (SINEQUAN) 50 MG capsule Take 100 mg by mouth at bedtime.     DULoxetine (CYMBALTA) 60 MG capsule Take 60 mg by mouth daily.     famotidine (PEPCID) 40 MG tablet Take 40 mg by mouth daily as needed for heartburn.     fluticasone (FLONASE) 50 MCG/ACT nasal spray Place 2 sprays into both nostrils at bedtime as needed for allergies.     folic acid (FOLVITE) 626 MCG tablet Take 800 mcg by mouth daily.     furosemide (LASIX) 20 MG tablet Take 20 mg by mouth daily as needed for fluid (swelling).     HYDROmorphone (DILAUDID) 2 MG tablet Take 1-2 tablets (2-4 mg total) by mouth every 6 (six) hours as needed for severe pain (For breakthrough pain.). 50 tablet 0   levothyroxine (SYNTHROID) 150 MCG tablet Take 150  mcg by mouth daily before breakfast.     lidocaine (LIDODERM) 5 % Place 3 patches onto the skin daily as needed (pain).      liothyronine (CYTOMEL) 5 MCG tablet Take 10 mcg by mouth daily.     lithium carbonate (ESKALITH) 450 MG CR tablet Take 450 mg by mouth at bedtime.     Melatonin 10 MG TABS Take 10 mg by mouth at bedtime.     Misc Natural Products (JOINT HEALTH PO) Take 1 tablet by mouth daily.     Multiple Vitamin (MULTIVITAMIN WITH MINERALS) TABS tablet Take 1 tablet by mouth daily.     ondansetron (ZOFRAN-ODT) 8 MG disintegrating tablet Take 8 mg by mouth every 8 (eight) hours as needed for nausea or vomiting.     Oxycodone HCl 20 MG TABS Take 20 mg by  mouth 5 (five) times daily.     oxymetazoline (AFRIN) 0.05 % nasal spray Place 1 spray into both nostrils at bedtime. With Saline     polyethylene glycol (MIRALAX / GLYCOLAX) 17 g packet Take 17 g by mouth daily.     pregabalin (LYRICA) 50 MG capsule Take 50 mg by mouth every 6 (six) hours.     REXULTI 3 MG TABS Take 3 mg by mouth daily.     tiZANidine (ZANAFLEX) 4 MG tablet Take 4 mg by mouth every 8 (eight) hours as needed for muscle spasms.     valACYclovir (VALTREX) 1000 MG tablet Take 2,000 mg by mouth 2 (two) times daily as needed (outbreaks).   1   valsartan (DIOVAN) 80 MG tablet Take 80 mg by mouth daily.     VIIBRYD 40 MG TABS Take 40 mg by mouth daily.      No current facility-administered medications on file prior to visit.    Allergies  Allergen Reactions   Conjugated Estrogens Other (See Comments)    progesterone interacted with antidepressants   Lactose Intolerance (Gi) Diarrhea    GI Distress    Review of Systems No fevers, chills, nausea, muscle aches, no difficulty breathing, no calf pain, no chest pain or shortness of breath.   Physical Exam  GENERAL APPEARANCE: Alert, conversant. Appropriately groomed. No acute distress.   VASCULAR: Pedal pulses palpable DP and PT bilateral.  Capillary refill time is immediate to all digits,  Proximal to distal cooling it warm to warm.  Digital perfusion adequate.   NEUROLOGIC: sensation is intact to 5.07 monofilament at 5/5 sites bilateral.  Light touch is intact bilateral, vibratory sensation intact bilateral  MUSCULOSKELETAL: acceptable muscle strength, tone and stability bilateral.  No gross boney pedal deformities noted.  No pain, crepitus or limitation noted with foot and ankle range of motion bilateral.   DERMATOLOGIC: skin is warm, supple, and dry.  Distal tips of the halluces have thickened skin present which is uncomfortable to the patient.  Left hallux nail medial nail border is sharply incurvated and painful with  pressure.  No redness, no swelling no sign of infection is noted.  Significant tenderness with palpation is noted.   Assessment   1. Ingrown left greater toenail   2. Skin callus       Plan  -Treatment options and alternatives were discussed with the patient at this time I recommended removing the incurvated portion of the nail that is growing into the skin and causing pain.  The patient agreed and I prepped the skin with alcohol and infiltrated lidocaine Marcaine mixture in a digital block fashion of the left  hallux without complication.  The toe was then prepped with Betadine and exsanguinated.  The medial nail border was then sharply removed from distal to proximal and the nail spicule was removed in total.  The matrix tissue was treated with phenol and then washed with alcohol.  Antibiotic ointment and a dry sterile compressive dressing was applied the patient was given instructions for soaks and aftercare. -Called in a prescription for Carmol 40 for her to try on the tips of the toes to help soften the skin discussed that AmLactin and Lac-Hydrin were also alternatives if the prescription is not covered under insurance.  She requested a prescription for this medication. -Also going to put her on a antibiotic considering she just had her knee surgery.  This will be called in as well.

## 2021-03-24 DIAGNOSIS — Z96651 Presence of right artificial knee joint: Secondary | ICD-10-CM | POA: Diagnosis not present

## 2021-03-24 DIAGNOSIS — M25661 Stiffness of right knee, not elsewhere classified: Secondary | ICD-10-CM | POA: Diagnosis not present

## 2021-03-24 DIAGNOSIS — R531 Weakness: Secondary | ICD-10-CM | POA: Diagnosis not present

## 2021-03-26 DIAGNOSIS — R531 Weakness: Secondary | ICD-10-CM | POA: Diagnosis not present

## 2021-03-26 DIAGNOSIS — Z96651 Presence of right artificial knee joint: Secondary | ICD-10-CM | POA: Diagnosis not present

## 2021-03-26 DIAGNOSIS — M25661 Stiffness of right knee, not elsewhere classified: Secondary | ICD-10-CM | POA: Diagnosis not present

## 2021-03-28 DIAGNOSIS — R531 Weakness: Secondary | ICD-10-CM | POA: Diagnosis not present

## 2021-03-28 DIAGNOSIS — Z96651 Presence of right artificial knee joint: Secondary | ICD-10-CM | POA: Diagnosis not present

## 2021-03-28 DIAGNOSIS — M25661 Stiffness of right knee, not elsewhere classified: Secondary | ICD-10-CM | POA: Diagnosis not present

## 2021-03-31 DIAGNOSIS — R531 Weakness: Secondary | ICD-10-CM | POA: Diagnosis not present

## 2021-03-31 DIAGNOSIS — M25661 Stiffness of right knee, not elsewhere classified: Secondary | ICD-10-CM | POA: Diagnosis not present

## 2021-03-31 DIAGNOSIS — Z96651 Presence of right artificial knee joint: Secondary | ICD-10-CM | POA: Diagnosis not present

## 2021-04-02 DIAGNOSIS — Z96651 Presence of right artificial knee joint: Secondary | ICD-10-CM | POA: Diagnosis not present

## 2021-04-02 DIAGNOSIS — M25661 Stiffness of right knee, not elsewhere classified: Secondary | ICD-10-CM | POA: Diagnosis not present

## 2021-04-02 DIAGNOSIS — R531 Weakness: Secondary | ICD-10-CM | POA: Diagnosis not present

## 2021-04-04 DIAGNOSIS — M25661 Stiffness of right knee, not elsewhere classified: Secondary | ICD-10-CM | POA: Diagnosis not present

## 2021-04-04 DIAGNOSIS — Z96651 Presence of right artificial knee joint: Secondary | ICD-10-CM | POA: Diagnosis not present

## 2021-04-04 DIAGNOSIS — R531 Weakness: Secondary | ICD-10-CM | POA: Diagnosis not present

## 2021-04-07 DIAGNOSIS — R531 Weakness: Secondary | ICD-10-CM | POA: Diagnosis not present

## 2021-04-07 DIAGNOSIS — Z96651 Presence of right artificial knee joint: Secondary | ICD-10-CM | POA: Diagnosis not present

## 2021-04-07 DIAGNOSIS — M25661 Stiffness of right knee, not elsewhere classified: Secondary | ICD-10-CM | POA: Diagnosis not present

## 2021-04-09 DIAGNOSIS — R531 Weakness: Secondary | ICD-10-CM | POA: Diagnosis not present

## 2021-04-09 DIAGNOSIS — Z96651 Presence of right artificial knee joint: Secondary | ICD-10-CM | POA: Diagnosis not present

## 2021-04-09 DIAGNOSIS — M25661 Stiffness of right knee, not elsewhere classified: Secondary | ICD-10-CM | POA: Diagnosis not present

## 2021-04-11 DIAGNOSIS — Z96651 Presence of right artificial knee joint: Secondary | ICD-10-CM | POA: Diagnosis not present

## 2021-04-11 DIAGNOSIS — S83242A Other tear of medial meniscus, current injury, left knee, initial encounter: Secondary | ICD-10-CM | POA: Diagnosis not present

## 2021-04-11 DIAGNOSIS — Z471 Aftercare following joint replacement surgery: Secondary | ICD-10-CM | POA: Diagnosis not present

## 2021-04-11 DIAGNOSIS — M25661 Stiffness of right knee, not elsewhere classified: Secondary | ICD-10-CM | POA: Diagnosis not present

## 2021-04-11 DIAGNOSIS — M25562 Pain in left knee: Secondary | ICD-10-CM | POA: Diagnosis not present

## 2021-04-11 DIAGNOSIS — R531 Weakness: Secondary | ICD-10-CM | POA: Diagnosis not present

## 2021-04-14 DIAGNOSIS — Z96651 Presence of right artificial knee joint: Secondary | ICD-10-CM | POA: Diagnosis not present

## 2021-04-14 DIAGNOSIS — R531 Weakness: Secondary | ICD-10-CM | POA: Diagnosis not present

## 2021-04-14 DIAGNOSIS — M25661 Stiffness of right knee, not elsewhere classified: Secondary | ICD-10-CM | POA: Diagnosis not present

## 2021-04-16 DIAGNOSIS — D225 Melanocytic nevi of trunk: Secondary | ICD-10-CM | POA: Diagnosis not present

## 2021-04-16 DIAGNOSIS — R531 Weakness: Secondary | ICD-10-CM | POA: Diagnosis not present

## 2021-04-16 DIAGNOSIS — M25661 Stiffness of right knee, not elsewhere classified: Secondary | ICD-10-CM | POA: Diagnosis not present

## 2021-04-16 DIAGNOSIS — L218 Other seborrheic dermatitis: Secondary | ICD-10-CM | POA: Diagnosis not present

## 2021-04-16 DIAGNOSIS — Z96651 Presence of right artificial knee joint: Secondary | ICD-10-CM | POA: Diagnosis not present

## 2021-04-16 DIAGNOSIS — L814 Other melanin hyperpigmentation: Secondary | ICD-10-CM | POA: Diagnosis not present

## 2021-04-17 DIAGNOSIS — Z111 Encounter for screening for respiratory tuberculosis: Secondary | ICD-10-CM | POA: Diagnosis not present

## 2021-04-17 DIAGNOSIS — R768 Other specified abnormal immunological findings in serum: Secondary | ICD-10-CM | POA: Diagnosis not present

## 2021-04-17 DIAGNOSIS — M2550 Pain in unspecified joint: Secondary | ICD-10-CM | POA: Diagnosis not present

## 2021-04-17 DIAGNOSIS — G894 Chronic pain syndrome: Secondary | ICD-10-CM | POA: Diagnosis not present

## 2021-04-17 DIAGNOSIS — M150 Primary generalized (osteo)arthritis: Secondary | ICD-10-CM | POA: Diagnosis not present

## 2021-04-17 DIAGNOSIS — M3500 Sicca syndrome, unspecified: Secondary | ICD-10-CM | POA: Diagnosis not present

## 2021-04-17 DIAGNOSIS — R5382 Chronic fatigue, unspecified: Secondary | ICD-10-CM | POA: Diagnosis not present

## 2021-04-18 DIAGNOSIS — R531 Weakness: Secondary | ICD-10-CM | POA: Diagnosis not present

## 2021-04-18 DIAGNOSIS — M25661 Stiffness of right knee, not elsewhere classified: Secondary | ICD-10-CM | POA: Diagnosis not present

## 2021-04-18 DIAGNOSIS — Z96651 Presence of right artificial knee joint: Secondary | ICD-10-CM | POA: Diagnosis not present

## 2021-04-21 DIAGNOSIS — M25661 Stiffness of right knee, not elsewhere classified: Secondary | ICD-10-CM | POA: Diagnosis not present

## 2021-04-21 DIAGNOSIS — Z96651 Presence of right artificial knee joint: Secondary | ICD-10-CM | POA: Diagnosis not present

## 2021-04-21 DIAGNOSIS — R531 Weakness: Secondary | ICD-10-CM | POA: Diagnosis not present

## 2021-04-22 DIAGNOSIS — G4733 Obstructive sleep apnea (adult) (pediatric): Secondary | ICD-10-CM | POA: Diagnosis not present

## 2021-04-23 DIAGNOSIS — Z96651 Presence of right artificial knee joint: Secondary | ICD-10-CM | POA: Diagnosis not present

## 2021-04-23 DIAGNOSIS — M25661 Stiffness of right knee, not elsewhere classified: Secondary | ICD-10-CM | POA: Diagnosis not present

## 2021-04-23 DIAGNOSIS — R531 Weakness: Secondary | ICD-10-CM | POA: Diagnosis not present

## 2021-04-24 DIAGNOSIS — F410 Panic disorder [episodic paroxysmal anxiety] without agoraphobia: Secondary | ICD-10-CM | POA: Diagnosis not present

## 2021-04-24 DIAGNOSIS — F331 Major depressive disorder, recurrent, moderate: Secondary | ICD-10-CM | POA: Diagnosis not present

## 2021-04-25 DIAGNOSIS — Z96651 Presence of right artificial knee joint: Secondary | ICD-10-CM | POA: Diagnosis not present

## 2021-04-25 DIAGNOSIS — M25661 Stiffness of right knee, not elsewhere classified: Secondary | ICD-10-CM | POA: Diagnosis not present

## 2021-04-25 DIAGNOSIS — R531 Weakness: Secondary | ICD-10-CM | POA: Diagnosis not present

## 2021-04-28 DIAGNOSIS — R531 Weakness: Secondary | ICD-10-CM | POA: Diagnosis not present

## 2021-04-28 DIAGNOSIS — Z96651 Presence of right artificial knee joint: Secondary | ICD-10-CM | POA: Diagnosis not present

## 2021-04-28 DIAGNOSIS — M25661 Stiffness of right knee, not elsewhere classified: Secondary | ICD-10-CM | POA: Diagnosis not present

## 2021-04-30 DIAGNOSIS — M25661 Stiffness of right knee, not elsewhere classified: Secondary | ICD-10-CM | POA: Diagnosis not present

## 2021-04-30 DIAGNOSIS — Z96651 Presence of right artificial knee joint: Secondary | ICD-10-CM | POA: Diagnosis not present

## 2021-04-30 DIAGNOSIS — R531 Weakness: Secondary | ICD-10-CM | POA: Diagnosis not present

## 2021-05-02 DIAGNOSIS — M25661 Stiffness of right knee, not elsewhere classified: Secondary | ICD-10-CM | POA: Diagnosis not present

## 2021-05-02 DIAGNOSIS — R531 Weakness: Secondary | ICD-10-CM | POA: Diagnosis not present

## 2021-05-02 DIAGNOSIS — Z96651 Presence of right artificial knee joint: Secondary | ICD-10-CM | POA: Diagnosis not present

## 2021-05-05 DIAGNOSIS — M25661 Stiffness of right knee, not elsewhere classified: Secondary | ICD-10-CM | POA: Diagnosis not present

## 2021-05-05 DIAGNOSIS — R531 Weakness: Secondary | ICD-10-CM | POA: Diagnosis not present

## 2021-05-05 DIAGNOSIS — Z96651 Presence of right artificial knee joint: Secondary | ICD-10-CM | POA: Diagnosis not present

## 2021-05-05 DIAGNOSIS — M25562 Pain in left knee: Secondary | ICD-10-CM | POA: Diagnosis not present

## 2021-05-07 DIAGNOSIS — M25661 Stiffness of right knee, not elsewhere classified: Secondary | ICD-10-CM | POA: Diagnosis not present

## 2021-05-07 DIAGNOSIS — Z96651 Presence of right artificial knee joint: Secondary | ICD-10-CM | POA: Diagnosis not present

## 2021-05-07 DIAGNOSIS — R531 Weakness: Secondary | ICD-10-CM | POA: Diagnosis not present

## 2021-05-09 DIAGNOSIS — M25661 Stiffness of right knee, not elsewhere classified: Secondary | ICD-10-CM | POA: Diagnosis not present

## 2021-05-09 DIAGNOSIS — R531 Weakness: Secondary | ICD-10-CM | POA: Diagnosis not present

## 2021-05-09 DIAGNOSIS — Z96651 Presence of right artificial knee joint: Secondary | ICD-10-CM | POA: Diagnosis not present

## 2021-05-12 DIAGNOSIS — M1712 Unilateral primary osteoarthritis, left knee: Secondary | ICD-10-CM | POA: Diagnosis not present

## 2021-05-12 DIAGNOSIS — M25661 Stiffness of right knee, not elsewhere classified: Secondary | ICD-10-CM | POA: Diagnosis not present

## 2021-05-12 DIAGNOSIS — Z96651 Presence of right artificial knee joint: Secondary | ICD-10-CM | POA: Diagnosis not present

## 2021-05-12 DIAGNOSIS — R531 Weakness: Secondary | ICD-10-CM | POA: Diagnosis not present

## 2021-05-14 DIAGNOSIS — E890 Postprocedural hypothyroidism: Secondary | ICD-10-CM | POA: Diagnosis not present

## 2021-05-14 DIAGNOSIS — Z96651 Presence of right artificial knee joint: Secondary | ICD-10-CM | POA: Diagnosis not present

## 2021-05-14 DIAGNOSIS — R531 Weakness: Secondary | ICD-10-CM | POA: Diagnosis not present

## 2021-05-14 DIAGNOSIS — M25661 Stiffness of right knee, not elsewhere classified: Secondary | ICD-10-CM | POA: Diagnosis not present

## 2021-05-14 DIAGNOSIS — N951 Menopausal and female climacteric states: Secondary | ICD-10-CM | POA: Diagnosis not present

## 2021-05-15 DIAGNOSIS — M150 Primary generalized (osteo)arthritis: Secondary | ICD-10-CM | POA: Diagnosis not present

## 2021-05-15 DIAGNOSIS — R5382 Chronic fatigue, unspecified: Secondary | ICD-10-CM | POA: Diagnosis not present

## 2021-05-15 DIAGNOSIS — R768 Other specified abnormal immunological findings in serum: Secondary | ICD-10-CM | POA: Diagnosis not present

## 2021-05-15 DIAGNOSIS — G894 Chronic pain syndrome: Secondary | ICD-10-CM | POA: Diagnosis not present

## 2021-05-16 DIAGNOSIS — M25661 Stiffness of right knee, not elsewhere classified: Secondary | ICD-10-CM | POA: Diagnosis not present

## 2021-05-16 DIAGNOSIS — Z6841 Body Mass Index (BMI) 40.0 and over, adult: Secondary | ICD-10-CM | POA: Diagnosis not present

## 2021-05-16 DIAGNOSIS — R6882 Decreased libido: Secondary | ICD-10-CM | POA: Diagnosis not present

## 2021-05-16 DIAGNOSIS — R232 Flushing: Secondary | ICD-10-CM | POA: Diagnosis not present

## 2021-05-16 DIAGNOSIS — N951 Menopausal and female climacteric states: Secondary | ICD-10-CM | POA: Diagnosis not present

## 2021-05-16 DIAGNOSIS — Z96651 Presence of right artificial knee joint: Secondary | ICD-10-CM | POA: Diagnosis not present

## 2021-05-16 DIAGNOSIS — R531 Weakness: Secondary | ICD-10-CM | POA: Diagnosis not present

## 2021-05-19 DIAGNOSIS — R531 Weakness: Secondary | ICD-10-CM | POA: Diagnosis not present

## 2021-05-19 DIAGNOSIS — M25661 Stiffness of right knee, not elsewhere classified: Secondary | ICD-10-CM | POA: Diagnosis not present

## 2021-05-19 DIAGNOSIS — Z96651 Presence of right artificial knee joint: Secondary | ICD-10-CM | POA: Diagnosis not present

## 2021-05-21 DIAGNOSIS — M25661 Stiffness of right knee, not elsewhere classified: Secondary | ICD-10-CM | POA: Diagnosis not present

## 2021-05-21 DIAGNOSIS — R531 Weakness: Secondary | ICD-10-CM | POA: Diagnosis not present

## 2021-05-21 DIAGNOSIS — Z96651 Presence of right artificial knee joint: Secondary | ICD-10-CM | POA: Diagnosis not present

## 2021-05-23 DIAGNOSIS — G4733 Obstructive sleep apnea (adult) (pediatric): Secondary | ICD-10-CM | POA: Diagnosis not present

## 2021-05-26 DIAGNOSIS — M25661 Stiffness of right knee, not elsewhere classified: Secondary | ICD-10-CM | POA: Diagnosis not present

## 2021-05-26 DIAGNOSIS — Z96651 Presence of right artificial knee joint: Secondary | ICD-10-CM | POA: Diagnosis not present

## 2021-05-26 DIAGNOSIS — R531 Weakness: Secondary | ICD-10-CM | POA: Diagnosis not present

## 2021-05-29 DIAGNOSIS — R531 Weakness: Secondary | ICD-10-CM | POA: Diagnosis not present

## 2021-05-29 DIAGNOSIS — Z96651 Presence of right artificial knee joint: Secondary | ICD-10-CM | POA: Diagnosis not present

## 2021-05-29 DIAGNOSIS — M25661 Stiffness of right knee, not elsewhere classified: Secondary | ICD-10-CM | POA: Diagnosis not present

## 2021-06-02 DIAGNOSIS — Z96651 Presence of right artificial knee joint: Secondary | ICD-10-CM | POA: Diagnosis not present

## 2021-06-02 DIAGNOSIS — M25661 Stiffness of right knee, not elsewhere classified: Secondary | ICD-10-CM | POA: Diagnosis not present

## 2021-06-02 DIAGNOSIS — R531 Weakness: Secondary | ICD-10-CM | POA: Diagnosis not present

## 2021-06-04 DIAGNOSIS — F331 Major depressive disorder, recurrent, moderate: Secondary | ICD-10-CM | POA: Diagnosis not present

## 2021-06-04 DIAGNOSIS — G4700 Insomnia, unspecified: Secondary | ICD-10-CM | POA: Diagnosis not present

## 2021-06-05 DIAGNOSIS — Z96651 Presence of right artificial knee joint: Secondary | ICD-10-CM | POA: Diagnosis not present

## 2021-06-05 DIAGNOSIS — R531 Weakness: Secondary | ICD-10-CM | POA: Diagnosis not present

## 2021-06-05 DIAGNOSIS — M25661 Stiffness of right knee, not elsewhere classified: Secondary | ICD-10-CM | POA: Diagnosis not present

## 2021-06-09 DIAGNOSIS — M25661 Stiffness of right knee, not elsewhere classified: Secondary | ICD-10-CM | POA: Diagnosis not present

## 2021-06-09 DIAGNOSIS — Z96651 Presence of right artificial knee joint: Secondary | ICD-10-CM | POA: Diagnosis not present

## 2021-06-09 DIAGNOSIS — R531 Weakness: Secondary | ICD-10-CM | POA: Diagnosis not present

## 2021-06-12 DIAGNOSIS — M25562 Pain in left knee: Secondary | ICD-10-CM | POA: Diagnosis not present

## 2021-06-16 DIAGNOSIS — M6752 Plica syndrome, left knee: Secondary | ICD-10-CM | POA: Diagnosis not present

## 2021-06-16 DIAGNOSIS — M2342 Loose body in knee, left knee: Secondary | ICD-10-CM | POA: Diagnosis not present

## 2021-06-16 DIAGNOSIS — M94262 Chondromalacia, left knee: Secondary | ICD-10-CM | POA: Diagnosis not present

## 2021-06-19 DIAGNOSIS — M25662 Stiffness of left knee, not elsewhere classified: Secondary | ICD-10-CM | POA: Diagnosis not present

## 2021-06-19 DIAGNOSIS — M6281 Muscle weakness (generalized): Secondary | ICD-10-CM | POA: Diagnosis not present

## 2021-06-22 DIAGNOSIS — G4733 Obstructive sleep apnea (adult) (pediatric): Secondary | ICD-10-CM | POA: Diagnosis not present

## 2021-06-24 DIAGNOSIS — M6281 Muscle weakness (generalized): Secondary | ICD-10-CM | POA: Diagnosis not present

## 2021-06-24 DIAGNOSIS — M25662 Stiffness of left knee, not elsewhere classified: Secondary | ICD-10-CM | POA: Diagnosis not present

## 2021-06-27 DIAGNOSIS — M25662 Stiffness of left knee, not elsewhere classified: Secondary | ICD-10-CM | POA: Diagnosis not present

## 2021-06-27 DIAGNOSIS — M6281 Muscle weakness (generalized): Secondary | ICD-10-CM | POA: Diagnosis not present

## 2021-07-02 DIAGNOSIS — M25662 Stiffness of left knee, not elsewhere classified: Secondary | ICD-10-CM | POA: Diagnosis not present

## 2021-07-02 DIAGNOSIS — M6281 Muscle weakness (generalized): Secondary | ICD-10-CM | POA: Diagnosis not present

## 2021-07-03 DIAGNOSIS — M25662 Stiffness of left knee, not elsewhere classified: Secondary | ICD-10-CM | POA: Diagnosis not present

## 2021-07-03 DIAGNOSIS — M6281 Muscle weakness (generalized): Secondary | ICD-10-CM | POA: Diagnosis not present

## 2021-07-07 DIAGNOSIS — M25662 Stiffness of left knee, not elsewhere classified: Secondary | ICD-10-CM | POA: Diagnosis not present

## 2021-07-07 DIAGNOSIS — M6281 Muscle weakness (generalized): Secondary | ICD-10-CM | POA: Diagnosis not present

## 2021-07-10 DIAGNOSIS — M6281 Muscle weakness (generalized): Secondary | ICD-10-CM | POA: Diagnosis not present

## 2021-07-10 DIAGNOSIS — M25662 Stiffness of left knee, not elsewhere classified: Secondary | ICD-10-CM | POA: Diagnosis not present

## 2021-07-11 DIAGNOSIS — R1013 Epigastric pain: Secondary | ICD-10-CM | POA: Diagnosis not present

## 2021-07-11 DIAGNOSIS — R195 Other fecal abnormalities: Secondary | ICD-10-CM | POA: Diagnosis not present

## 2021-07-11 DIAGNOSIS — R197 Diarrhea, unspecified: Secondary | ICD-10-CM | POA: Diagnosis not present

## 2021-07-12 DIAGNOSIS — F410 Panic disorder [episodic paroxysmal anxiety] without agoraphobia: Secondary | ICD-10-CM | POA: Diagnosis not present

## 2021-07-12 DIAGNOSIS — G4700 Insomnia, unspecified: Secondary | ICD-10-CM | POA: Diagnosis not present

## 2021-07-12 DIAGNOSIS — F900 Attention-deficit hyperactivity disorder, predominantly inattentive type: Secondary | ICD-10-CM | POA: Diagnosis not present

## 2021-07-12 DIAGNOSIS — F331 Major depressive disorder, recurrent, moderate: Secondary | ICD-10-CM | POA: Diagnosis not present

## 2021-07-14 DIAGNOSIS — M25662 Stiffness of left knee, not elsewhere classified: Secondary | ICD-10-CM | POA: Diagnosis not present

## 2021-07-14 DIAGNOSIS — M6281 Muscle weakness (generalized): Secondary | ICD-10-CM | POA: Diagnosis not present

## 2021-07-16 ENCOUNTER — Other Ambulatory Visit: Payer: Self-pay | Admitting: Family Medicine

## 2021-07-16 DIAGNOSIS — R1013 Epigastric pain: Secondary | ICD-10-CM

## 2021-07-17 DIAGNOSIS — M25561 Pain in right knee: Secondary | ICD-10-CM | POA: Diagnosis not present

## 2021-07-17 DIAGNOSIS — M25562 Pain in left knee: Secondary | ICD-10-CM | POA: Diagnosis not present

## 2021-07-17 DIAGNOSIS — M25662 Stiffness of left knee, not elsewhere classified: Secondary | ICD-10-CM | POA: Diagnosis not present

## 2021-07-17 DIAGNOSIS — M6281 Muscle weakness (generalized): Secondary | ICD-10-CM | POA: Diagnosis not present

## 2021-07-21 ENCOUNTER — Other Ambulatory Visit: Payer: BC Managed Care – PPO

## 2021-07-21 DIAGNOSIS — G4733 Obstructive sleep apnea (adult) (pediatric): Secondary | ICD-10-CM | POA: Diagnosis not present

## 2021-07-22 ENCOUNTER — Ambulatory Visit
Admission: RE | Admit: 2021-07-22 | Discharge: 2021-07-22 | Disposition: A | Discharge status: Home / Self Care | Payer: BC Managed Care – PPO | Source: Ambulatory Visit | Attending: Family Medicine | Admitting: Family Medicine

## 2021-07-22 DIAGNOSIS — R1013 Epigastric pain: Secondary | ICD-10-CM | POA: Diagnosis not present

## 2021-07-22 DIAGNOSIS — N200 Calculus of kidney: Secondary | ICD-10-CM | POA: Diagnosis not present

## 2021-07-24 DIAGNOSIS — G4733 Obstructive sleep apnea (adult) (pediatric): Secondary | ICD-10-CM | POA: Diagnosis not present

## 2021-08-07 DIAGNOSIS — R030 Elevated blood-pressure reading, without diagnosis of hypertension: Secondary | ICD-10-CM | POA: Diagnosis not present

## 2021-08-07 DIAGNOSIS — M546 Pain in thoracic spine: Secondary | ICD-10-CM | POA: Diagnosis not present

## 2021-08-18 DIAGNOSIS — N951 Menopausal and female climacteric states: Secondary | ICD-10-CM | POA: Diagnosis not present

## 2021-08-20 DIAGNOSIS — Z0000 Encounter for general adult medical examination without abnormal findings: Secondary | ICD-10-CM | POA: Diagnosis not present

## 2021-08-20 DIAGNOSIS — Z13220 Encounter for screening for lipoid disorders: Secondary | ICD-10-CM | POA: Diagnosis not present

## 2021-08-20 DIAGNOSIS — E559 Vitamin D deficiency, unspecified: Secondary | ICD-10-CM | POA: Diagnosis not present

## 2021-08-21 ENCOUNTER — Other Ambulatory Visit: Payer: Self-pay | Admitting: Neurological Surgery

## 2021-08-21 DIAGNOSIS — Z6841 Body Mass Index (BMI) 40.0 and over, adult: Secondary | ICD-10-CM | POA: Diagnosis not present

## 2021-08-21 DIAGNOSIS — N951 Menopausal and female climacteric states: Secondary | ICD-10-CM | POA: Diagnosis not present

## 2021-08-21 DIAGNOSIS — Z23 Encounter for immunization: Secondary | ICD-10-CM | POA: Diagnosis not present

## 2021-08-21 DIAGNOSIS — Z0000 Encounter for general adult medical examination without abnormal findings: Secondary | ICD-10-CM | POA: Diagnosis not present

## 2021-08-21 DIAGNOSIS — R232 Flushing: Secondary | ICD-10-CM | POA: Diagnosis not present

## 2021-08-21 DIAGNOSIS — M546 Pain in thoracic spine: Secondary | ICD-10-CM

## 2021-08-21 DIAGNOSIS — R6882 Decreased libido: Secondary | ICD-10-CM | POA: Diagnosis not present

## 2021-08-21 DIAGNOSIS — L718 Other rosacea: Secondary | ICD-10-CM | POA: Diagnosis not present

## 2021-08-21 DIAGNOSIS — G4733 Obstructive sleep apnea (adult) (pediatric): Secondary | ICD-10-CM | POA: Diagnosis not present

## 2021-08-28 DIAGNOSIS — N281 Cyst of kidney, acquired: Secondary | ICD-10-CM | POA: Diagnosis not present

## 2021-08-28 DIAGNOSIS — N393 Stress incontinence (female) (male): Secondary | ICD-10-CM | POA: Diagnosis not present

## 2021-08-28 DIAGNOSIS — N3944 Nocturnal enuresis: Secondary | ICD-10-CM | POA: Diagnosis not present

## 2021-08-28 DIAGNOSIS — N200 Calculus of kidney: Secondary | ICD-10-CM | POA: Diagnosis not present

## 2021-09-08 ENCOUNTER — Other Ambulatory Visit: Payer: BC Managed Care – PPO

## 2021-09-08 DIAGNOSIS — F5101 Primary insomnia: Secondary | ICD-10-CM | POA: Diagnosis not present

## 2021-09-08 DIAGNOSIS — F410 Panic disorder [episodic paroxysmal anxiety] without agoraphobia: Secondary | ICD-10-CM | POA: Diagnosis not present

## 2021-09-08 DIAGNOSIS — F3341 Major depressive disorder, recurrent, in partial remission: Secondary | ICD-10-CM | POA: Diagnosis not present

## 2021-09-08 DIAGNOSIS — F900 Attention-deficit hyperactivity disorder, predominantly inattentive type: Secondary | ICD-10-CM | POA: Diagnosis not present

## 2021-09-17 DIAGNOSIS — R519 Headache, unspecified: Secondary | ICD-10-CM | POA: Diagnosis not present

## 2021-09-17 DIAGNOSIS — R040 Epistaxis: Secondary | ICD-10-CM | POA: Diagnosis not present

## 2021-09-18 DIAGNOSIS — G4733 Obstructive sleep apnea (adult) (pediatric): Secondary | ICD-10-CM | POA: Diagnosis not present

## 2021-09-26 ENCOUNTER — Ambulatory Visit
Admission: RE | Admit: 2021-09-26 | Discharge: 2021-09-26 | Disposition: A | Discharge status: Home / Self Care | Payer: BC Managed Care – PPO | Source: Ambulatory Visit | Attending: Neurological Surgery | Admitting: Neurological Surgery

## 2021-09-26 DIAGNOSIS — M5124 Other intervertebral disc displacement, thoracic region: Secondary | ICD-10-CM | POA: Diagnosis not present

## 2021-09-26 DIAGNOSIS — M546 Pain in thoracic spine: Secondary | ICD-10-CM

## 2021-09-26 DIAGNOSIS — M47814 Spondylosis without myelopathy or radiculopathy, thoracic region: Secondary | ICD-10-CM | POA: Diagnosis not present

## 2021-10-21 DIAGNOSIS — G4733 Obstructive sleep apnea (adult) (pediatric): Secondary | ICD-10-CM | POA: Diagnosis not present

## 2021-10-22 DIAGNOSIS — G4733 Obstructive sleep apnea (adult) (pediatric): Secondary | ICD-10-CM | POA: Diagnosis not present

## 2021-11-11 DIAGNOSIS — M25562 Pain in left knee: Secondary | ICD-10-CM | POA: Diagnosis not present

## 2021-11-11 DIAGNOSIS — M1712 Unilateral primary osteoarthritis, left knee: Secondary | ICD-10-CM | POA: Diagnosis not present

## 2021-11-20 DIAGNOSIS — G4733 Obstructive sleep apnea (adult) (pediatric): Secondary | ICD-10-CM | POA: Diagnosis not present

## 2021-11-22 DIAGNOSIS — M25562 Pain in left knee: Secondary | ICD-10-CM | POA: Diagnosis not present

## 2021-11-27 DIAGNOSIS — N951 Menopausal and female climacteric states: Secondary | ICD-10-CM | POA: Diagnosis not present

## 2021-12-01 DIAGNOSIS — N951 Menopausal and female climacteric states: Secondary | ICD-10-CM | POA: Diagnosis not present

## 2021-12-01 DIAGNOSIS — Z6841 Body Mass Index (BMI) 40.0 and over, adult: Secondary | ICD-10-CM | POA: Diagnosis not present

## 2021-12-01 DIAGNOSIS — R232 Flushing: Secondary | ICD-10-CM | POA: Diagnosis not present

## 2021-12-01 DIAGNOSIS — R6882 Decreased libido: Secondary | ICD-10-CM | POA: Diagnosis not present

## 2021-12-02 DIAGNOSIS — Z1231 Encounter for screening mammogram for malignant neoplasm of breast: Secondary | ICD-10-CM | POA: Diagnosis not present

## 2021-12-02 DIAGNOSIS — Z1389 Encounter for screening for other disorder: Secondary | ICD-10-CM | POA: Diagnosis not present

## 2021-12-02 DIAGNOSIS — Z124 Encounter for screening for malignant neoplasm of cervix: Secondary | ICD-10-CM | POA: Diagnosis not present

## 2021-12-02 DIAGNOSIS — Z01419 Encounter for gynecological examination (general) (routine) without abnormal findings: Secondary | ICD-10-CM | POA: Diagnosis not present

## 2021-12-02 DIAGNOSIS — Z1272 Encounter for screening for malignant neoplasm of vagina: Secondary | ICD-10-CM | POA: Diagnosis not present

## 2021-12-02 DIAGNOSIS — Z1151 Encounter for screening for human papillomavirus (HPV): Secondary | ICD-10-CM | POA: Diagnosis not present

## 2021-12-10 ENCOUNTER — Other Ambulatory Visit: Payer: Self-pay | Admitting: Obstetrics and Gynecology

## 2021-12-10 DIAGNOSIS — R928 Other abnormal and inconclusive findings on diagnostic imaging of breast: Secondary | ICD-10-CM

## 2021-12-11 DIAGNOSIS — F900 Attention-deficit hyperactivity disorder, predominantly inattentive type: Secondary | ICD-10-CM | POA: Diagnosis not present

## 2021-12-11 DIAGNOSIS — F410 Panic disorder [episodic paroxysmal anxiety] without agoraphobia: Secondary | ICD-10-CM | POA: Diagnosis not present

## 2021-12-11 DIAGNOSIS — F331 Major depressive disorder, recurrent, moderate: Secondary | ICD-10-CM | POA: Diagnosis not present

## 2021-12-11 DIAGNOSIS — F5101 Primary insomnia: Secondary | ICD-10-CM | POA: Diagnosis not present

## 2021-12-18 DIAGNOSIS — G894 Chronic pain syndrome: Secondary | ICD-10-CM | POA: Diagnosis not present

## 2021-12-19 ENCOUNTER — Other Ambulatory Visit: Payer: BC Managed Care – PPO

## 2021-12-19 DIAGNOSIS — M7672 Peroneal tendinitis, left leg: Secondary | ICD-10-CM | POA: Diagnosis not present

## 2021-12-21 DIAGNOSIS — G4733 Obstructive sleep apnea (adult) (pediatric): Secondary | ICD-10-CM | POA: Diagnosis not present

## 2021-12-23 ENCOUNTER — Other Ambulatory Visit: Payer: BC Managed Care – PPO

## 2021-12-25 ENCOUNTER — Ambulatory Visit: Payer: BC Managed Care – PPO | Admitting: Podiatry

## 2021-12-25 DIAGNOSIS — I456 Pre-excitation syndrome: Secondary | ICD-10-CM | POA: Diagnosis not present

## 2022-01-06 ENCOUNTER — Ambulatory Visit
Admission: RE | Admit: 2022-01-06 | Discharge: 2022-01-06 | Disposition: A | Discharge status: Home / Self Care | Payer: BC Managed Care – PPO | Source: Ambulatory Visit | Attending: Obstetrics and Gynecology | Admitting: Obstetrics and Gynecology

## 2022-01-06 DIAGNOSIS — R928 Other abnormal and inconclusive findings on diagnostic imaging of breast: Secondary | ICD-10-CM

## 2022-01-06 DIAGNOSIS — N6001 Solitary cyst of right breast: Secondary | ICD-10-CM | POA: Diagnosis not present

## 2022-01-15 DIAGNOSIS — G894 Chronic pain syndrome: Secondary | ICD-10-CM | POA: Diagnosis not present

## 2022-01-20 DIAGNOSIS — G4733 Obstructive sleep apnea (adult) (pediatric): Secondary | ICD-10-CM | POA: Diagnosis not present

## 2022-01-21 DIAGNOSIS — G4733 Obstructive sleep apnea (adult) (pediatric): Secondary | ICD-10-CM | POA: Diagnosis not present

## 2022-01-23 DIAGNOSIS — R61 Generalized hyperhidrosis: Secondary | ICD-10-CM | POA: Diagnosis not present

## 2022-02-04 ENCOUNTER — Other Ambulatory Visit: Payer: Self-pay | Admitting: Family Medicine

## 2022-02-04 ENCOUNTER — Ambulatory Visit
Admission: RE | Admit: 2022-02-04 | Discharge: 2022-02-04 | Disposition: A | Discharge status: Home / Self Care | Payer: BC Managed Care – PPO | Source: Ambulatory Visit | Attending: Family Medicine | Admitting: Family Medicine

## 2022-02-04 DIAGNOSIS — R519 Headache, unspecified: Secondary | ICD-10-CM | POA: Diagnosis not present

## 2022-02-05 ENCOUNTER — Other Ambulatory Visit: Payer: Self-pay | Admitting: Family Medicine

## 2022-02-09 DIAGNOSIS — Z79899 Other long term (current) drug therapy: Secondary | ICD-10-CM | POA: Diagnosis not present

## 2022-02-09 DIAGNOSIS — M542 Cervicalgia: Secondary | ICD-10-CM | POA: Diagnosis not present

## 2022-02-09 DIAGNOSIS — R519 Headache, unspecified: Secondary | ICD-10-CM | POA: Diagnosis not present

## 2022-02-10 ENCOUNTER — Other Ambulatory Visit: Payer: Self-pay | Admitting: Family Medicine

## 2022-02-10 DIAGNOSIS — I456 Pre-excitation syndrome: Secondary | ICD-10-CM | POA: Diagnosis not present

## 2022-02-10 DIAGNOSIS — R519 Headache, unspecified: Secondary | ICD-10-CM

## 2022-02-10 DIAGNOSIS — I493 Ventricular premature depolarization: Secondary | ICD-10-CM | POA: Diagnosis not present

## 2022-02-10 DIAGNOSIS — R002 Palpitations: Secondary | ICD-10-CM | POA: Diagnosis not present

## 2022-02-10 DIAGNOSIS — M542 Cervicalgia: Secondary | ICD-10-CM

## 2022-02-13 DIAGNOSIS — G894 Chronic pain syndrome: Secondary | ICD-10-CM | POA: Diagnosis not present

## 2022-02-19 DIAGNOSIS — M5416 Radiculopathy, lumbar region: Secondary | ICD-10-CM | POA: Diagnosis not present

## 2022-02-19 DIAGNOSIS — M545 Low back pain, unspecified: Secondary | ICD-10-CM | POA: Diagnosis not present

## 2022-02-20 ENCOUNTER — Other Ambulatory Visit: Payer: Self-pay | Admitting: Family Medicine

## 2022-02-20 DIAGNOSIS — G4733 Obstructive sleep apnea (adult) (pediatric): Secondary | ICD-10-CM | POA: Diagnosis not present

## 2022-02-20 DIAGNOSIS — M5416 Radiculopathy, lumbar region: Secondary | ICD-10-CM

## 2022-02-24 DIAGNOSIS — L814 Other melanin hyperpigmentation: Secondary | ICD-10-CM | POA: Diagnosis not present

## 2022-02-24 DIAGNOSIS — L239 Allergic contact dermatitis, unspecified cause: Secondary | ICD-10-CM | POA: Diagnosis not present

## 2022-02-24 DIAGNOSIS — L570 Actinic keratosis: Secondary | ICD-10-CM | POA: Diagnosis not present

## 2022-02-24 DIAGNOSIS — L718 Other rosacea: Secondary | ICD-10-CM | POA: Diagnosis not present

## 2022-02-26 ENCOUNTER — Ambulatory Visit
Admission: RE | Admit: 2022-02-26 | Discharge: 2022-02-26 | Disposition: A | Discharge status: Home / Self Care | Payer: BC Managed Care – PPO | Source: Ambulatory Visit | Attending: Family Medicine | Admitting: Family Medicine

## 2022-02-26 DIAGNOSIS — M542 Cervicalgia: Secondary | ICD-10-CM | POA: Diagnosis not present

## 2022-02-26 DIAGNOSIS — E039 Hypothyroidism, unspecified: Secondary | ICD-10-CM | POA: Diagnosis not present

## 2022-02-26 DIAGNOSIS — R1310 Dysphagia, unspecified: Secondary | ICD-10-CM | POA: Diagnosis not present

## 2022-02-26 DIAGNOSIS — R519 Headache, unspecified: Secondary | ICD-10-CM

## 2022-03-04 ENCOUNTER — Other Ambulatory Visit: Payer: Self-pay | Admitting: Gastroenterology

## 2022-03-04 DIAGNOSIS — R109 Unspecified abdominal pain: Secondary | ICD-10-CM | POA: Diagnosis not present

## 2022-03-04 DIAGNOSIS — R1310 Dysphagia, unspecified: Secondary | ICD-10-CM | POA: Diagnosis not present

## 2022-03-06 ENCOUNTER — Other Ambulatory Visit: Payer: BC Managed Care – PPO

## 2022-03-11 DIAGNOSIS — R6882 Decreased libido: Secondary | ICD-10-CM | POA: Diagnosis not present

## 2022-03-11 DIAGNOSIS — N951 Menopausal and female climacteric states: Secondary | ICD-10-CM | POA: Diagnosis not present

## 2022-03-11 DIAGNOSIS — Z130 Encounter for screening for diseases of the blood and blood-forming organs and certain disorders involving the immune mechanism: Secondary | ICD-10-CM | POA: Diagnosis not present

## 2022-03-11 DIAGNOSIS — Z6838 Body mass index (BMI) 38.0-38.9, adult: Secondary | ICD-10-CM | POA: Diagnosis not present

## 2022-03-11 DIAGNOSIS — R232 Flushing: Secondary | ICD-10-CM | POA: Diagnosis not present

## 2022-03-13 ENCOUNTER — Ambulatory Visit
Admission: RE | Admit: 2022-03-13 | Discharge: 2022-03-13 | Disposition: A | Discharge status: Home / Self Care | Payer: BC Managed Care – PPO | Source: Ambulatory Visit | Attending: Gastroenterology | Admitting: Gastroenterology

## 2022-03-13 DIAGNOSIS — R1013 Epigastric pain: Secondary | ICD-10-CM | POA: Diagnosis not present

## 2022-03-13 DIAGNOSIS — R109 Unspecified abdominal pain: Secondary | ICD-10-CM

## 2022-03-13 DIAGNOSIS — R161 Splenomegaly, not elsewhere classified: Secondary | ICD-10-CM | POA: Diagnosis not present

## 2022-03-13 DIAGNOSIS — G894 Chronic pain syndrome: Secondary | ICD-10-CM | POA: Diagnosis not present

## 2022-03-16 DIAGNOSIS — L239 Allergic contact dermatitis, unspecified cause: Secondary | ICD-10-CM | POA: Diagnosis not present

## 2022-03-18 ENCOUNTER — Ambulatory Visit
Admission: RE | Admit: 2022-03-18 | Discharge: 2022-03-18 | Disposition: A | Discharge status: Home / Self Care | Payer: BC Managed Care – PPO | Source: Ambulatory Visit | Attending: Family Medicine | Admitting: Family Medicine

## 2022-03-18 DIAGNOSIS — M545 Low back pain, unspecified: Secondary | ICD-10-CM | POA: Diagnosis not present

## 2022-03-18 DIAGNOSIS — M48061 Spinal stenosis, lumbar region without neurogenic claudication: Secondary | ICD-10-CM | POA: Diagnosis not present

## 2022-03-18 DIAGNOSIS — M5416 Radiculopathy, lumbar region: Secondary | ICD-10-CM

## 2022-03-23 DIAGNOSIS — G4733 Obstructive sleep apnea (adult) (pediatric): Secondary | ICD-10-CM | POA: Diagnosis not present

## 2022-03-26 DIAGNOSIS — R5383 Other fatigue: Secondary | ICD-10-CM | POA: Diagnosis not present

## 2022-04-10 DIAGNOSIS — G894 Chronic pain syndrome: Secondary | ICD-10-CM | POA: Diagnosis not present

## 2022-04-17 ENCOUNTER — Ambulatory Visit (INDEPENDENT_AMBULATORY_CARE_PROVIDER_SITE_OTHER): Payer: BC Managed Care – PPO

## 2022-04-17 ENCOUNTER — Encounter: Payer: Self-pay | Admitting: Podiatry

## 2022-04-17 ENCOUNTER — Ambulatory Visit: Payer: BC Managed Care – PPO | Admitting: Podiatry

## 2022-04-17 DIAGNOSIS — M779 Enthesopathy, unspecified: Secondary | ICD-10-CM | POA: Diagnosis not present

## 2022-04-17 DIAGNOSIS — M7671 Peroneal tendinitis, right leg: Secondary | ICD-10-CM | POA: Diagnosis not present

## 2022-04-17 DIAGNOSIS — M79671 Pain in right foot: Secondary | ICD-10-CM

## 2022-04-17 MED ORDER — TRIAMCINOLONE ACETONIDE 10 MG/ML IJ SUSP
10.0000 mg | Freq: Once | INTRAMUSCULAR | Status: AC
  Administered 2022-04-17: 10 mg

## 2022-04-19 NOTE — Progress Notes (Signed)
Subjective:   Patient ID: [name], female   DOB: 51 y.o.   MRN: [id number]   HPI Patient presents with a lot of pain in the outside of the right foot stating its been hurting for several months does not remember injury   ROS      Objective:  Physical Exam  Vascular status intact with inflammation pain around the insertion of the peroneal tendon base of fifth metatarsal with no abnormal strength to the tendon noted     Assessment:  Peroneal tendinitis right with inflammation     Plan:  H&P reviewed condition went ahead today discussed injection treatment after reviewing x-ray and recommended.  I explained risk of this she wants to have it done I did sterile prep injected the sheath of the tendon 3 mg Dexasone Kenalog 5 mg Xylocaine and patient will be seen back if symptoms were to recur  X-rays were negative for signs of bony injury fracture or arthritis around the area fixation in place from previous surgery

## 2022-04-20 DIAGNOSIS — G4733 Obstructive sleep apnea (adult) (pediatric): Secondary | ICD-10-CM | POA: Diagnosis not present

## 2022-04-22 DIAGNOSIS — M25571 Pain in right ankle and joints of right foot: Secondary | ICD-10-CM | POA: Diagnosis not present

## 2022-04-22 DIAGNOSIS — M79671 Pain in right foot: Secondary | ICD-10-CM | POA: Diagnosis not present

## 2022-04-23 DIAGNOSIS — M5416 Radiculopathy, lumbar region: Secondary | ICD-10-CM | POA: Diagnosis not present

## 2022-04-23 DIAGNOSIS — G4733 Obstructive sleep apnea (adult) (pediatric): Secondary | ICD-10-CM | POA: Diagnosis not present

## 2022-04-23 DIAGNOSIS — M461 Sacroiliitis, not elsewhere classified: Secondary | ICD-10-CM | POA: Diagnosis not present

## 2022-04-28 ENCOUNTER — Other Ambulatory Visit: Payer: Self-pay | Admitting: Orthopaedic Surgery

## 2022-04-28 DIAGNOSIS — M79671 Pain in right foot: Secondary | ICD-10-CM

## 2022-04-28 DIAGNOSIS — M25571 Pain in right ankle and joints of right foot: Secondary | ICD-10-CM

## 2022-04-29 ENCOUNTER — Other Ambulatory Visit: Payer: Self-pay | Admitting: Podiatry

## 2022-04-29 DIAGNOSIS — M779 Enthesopathy, unspecified: Secondary | ICD-10-CM

## 2022-04-30 DIAGNOSIS — F4322 Adjustment disorder with anxiety: Secondary | ICD-10-CM | POA: Diagnosis not present

## 2022-04-30 DIAGNOSIS — F900 Attention-deficit hyperactivity disorder, predominantly inattentive type: Secondary | ICD-10-CM | POA: Diagnosis not present

## 2022-04-30 DIAGNOSIS — G4700 Insomnia, unspecified: Secondary | ICD-10-CM | POA: Diagnosis not present

## 2022-04-30 DIAGNOSIS — F410 Panic disorder [episodic paroxysmal anxiety] without agoraphobia: Secondary | ICD-10-CM | POA: Diagnosis not present

## 2022-05-05 DIAGNOSIS — K319 Disease of stomach and duodenum, unspecified: Secondary | ICD-10-CM | POA: Diagnosis not present

## 2022-05-05 DIAGNOSIS — R1013 Epigastric pain: Secondary | ICD-10-CM | POA: Diagnosis not present

## 2022-05-05 DIAGNOSIS — K259 Gastric ulcer, unspecified as acute or chronic, without hemorrhage or perforation: Secondary | ICD-10-CM | POA: Diagnosis not present

## 2022-05-12 DIAGNOSIS — G894 Chronic pain syndrome: Secondary | ICD-10-CM | POA: Diagnosis not present

## 2022-05-16 ENCOUNTER — Ambulatory Visit
Admission: RE | Admit: 2022-05-16 | Discharge: 2022-05-16 | Disposition: A | Discharge status: Home / Self Care | Payer: BC Managed Care – PPO | Source: Ambulatory Visit | Attending: Orthopaedic Surgery | Admitting: Orthopaedic Surgery

## 2022-05-16 DIAGNOSIS — M79671 Pain in right foot: Secondary | ICD-10-CM

## 2022-05-16 DIAGNOSIS — M25571 Pain in right ankle and joints of right foot: Secondary | ICD-10-CM

## 2022-05-16 DIAGNOSIS — M65871 Other synovitis and tenosynovitis, right ankle and foot: Secondary | ICD-10-CM | POA: Diagnosis not present

## 2022-05-16 DIAGNOSIS — Z9889 Other specified postprocedural states: Secondary | ICD-10-CM | POA: Diagnosis not present

## 2022-05-21 DIAGNOSIS — G4733 Obstructive sleep apnea (adult) (pediatric): Secondary | ICD-10-CM | POA: Diagnosis not present

## 2022-05-25 ENCOUNTER — Telehealth: Payer: Self-pay | Admitting: *Deleted

## 2022-05-25 DIAGNOSIS — M25571 Pain in right ankle and joints of right foot: Secondary | ICD-10-CM | POA: Diagnosis not present

## 2022-05-25 NOTE — Telephone Encounter (Signed)
Get her in on Wednesday and have her start soaking in epson salt

## 2022-05-25 NOTE — Telephone Encounter (Signed)
Patient has a very bad ingrown toenail, hard to walk, sheets and covers  hurt it , Is there something she can do to help before upcoming appointment on Thursday?

## 2022-05-26 NOTE — Telephone Encounter (Signed)
scheduled

## 2022-05-27 ENCOUNTER — Encounter: Payer: Self-pay | Admitting: Podiatry

## 2022-05-27 ENCOUNTER — Ambulatory Visit: Payer: BC Managed Care – PPO | Admitting: Podiatry

## 2022-05-27 DIAGNOSIS — L600 Ingrowing nail: Secondary | ICD-10-CM

## 2022-05-27 DIAGNOSIS — T148XXA Other injury of unspecified body region, initial encounter: Secondary | ICD-10-CM

## 2022-05-27 NOTE — Patient Instructions (Signed)

## 2022-05-27 NOTE — Progress Notes (Signed)
Subjective:   Patient ID: [name], female   DOB: 51 y.o.   MRN: [id number]   HPI Patient presents with painful ingrown toenail right big toe medial border and concerns about tear of the peroneal tendon with MRI that showed tear of the brevis tendon that is currently not bothering her but occasionally she gets pain   ROS      Objective:  Physical Exam  Neurovascular status intact muscle strength found to be adequate mild discomfort in the lateral aspect of the right foot with history of injection which gave her great relief at insertion and this is not where the tear is.  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  For the tear I do not recommend treatment currently but if symptoms were to get worse I want to see the patient back again and may require treatment for condition and I did review with her the MRI and we will review the results

## 2022-05-28 ENCOUNTER — Ambulatory Visit: Payer: BC Managed Care – PPO | Admitting: Podiatry

## 2022-06-08 DIAGNOSIS — E890 Postprocedural hypothyroidism: Secondary | ICD-10-CM | POA: Diagnosis not present

## 2022-06-08 DIAGNOSIS — N951 Menopausal and female climacteric states: Secondary | ICD-10-CM | POA: Diagnosis not present

## 2022-06-09 DIAGNOSIS — G894 Chronic pain syndrome: Secondary | ICD-10-CM | POA: Diagnosis not present

## 2022-06-10 ENCOUNTER — Ambulatory Visit (INDEPENDENT_AMBULATORY_CARE_PROVIDER_SITE_OTHER): Payer: BC Managed Care – PPO | Admitting: Podiatry

## 2022-06-10 ENCOUNTER — Encounter: Payer: Self-pay | Admitting: Podiatry

## 2022-06-10 VITALS — BP 123/86

## 2022-06-10 DIAGNOSIS — R232 Flushing: Secondary | ICD-10-CM | POA: Diagnosis not present

## 2022-06-10 DIAGNOSIS — M7672 Peroneal tendinitis, left leg: Secondary | ICD-10-CM | POA: Diagnosis not present

## 2022-06-10 DIAGNOSIS — R61 Generalized hyperhidrosis: Secondary | ICD-10-CM | POA: Diagnosis not present

## 2022-06-10 DIAGNOSIS — Z6837 Body mass index (BMI) 37.0-37.9, adult: Secondary | ICD-10-CM | POA: Diagnosis not present

## 2022-06-10 DIAGNOSIS — N951 Menopausal and female climacteric states: Secondary | ICD-10-CM | POA: Diagnosis not present

## 2022-06-10 MED ORDER — TRIAMCINOLONE ACETONIDE 10 MG/ML IJ SUSP
10.0000 mg | Freq: Once | INTRAMUSCULAR | Status: AC
  Administered 2022-06-10: 10 mg

## 2022-06-11 NOTE — Progress Notes (Signed)
Subjective:   Patient ID: [name], female   DOB: 51 y.o.   MRN: [id number]   HPI Patient states her left ankle has started to hurt recently like her right 1 did and she states that she is worried that she might have torn the tendon on that would like the right   ROS      Objective:  Physical Exam  Neurovascular status intact with patient found to have inflammation of the peroneal tendon left but no indications currently of tear of the tendon with fluid buildup as it comes under the lateral malleolus.  The right 1 has been relatively stable     Assessment:  History of split tear of peroneus brevis right with inflammation of the left peroneal tendon     Plan:  Sterile prep and went ahead and injected the sheath of the peroneal tendon left 3 mg Kenalog 5 mg Xylocaine discussed MRI if symptoms do not come under control and that ultimately she understands might need surgery.  Will reevaluate depending on the response to medication

## 2022-06-20 DIAGNOSIS — G4733 Obstructive sleep apnea (adult) (pediatric): Secondary | ICD-10-CM | POA: Diagnosis not present

## 2022-07-02 DIAGNOSIS — U071 COVID-19: Secondary | ICD-10-CM | POA: Diagnosis not present

## 2022-07-02 DIAGNOSIS — R058 Other specified cough: Secondary | ICD-10-CM | POA: Diagnosis not present

## 2022-07-09 DIAGNOSIS — G894 Chronic pain syndrome: Secondary | ICD-10-CM | POA: Diagnosis not present

## 2022-07-21 DIAGNOSIS — G4733 Obstructive sleep apnea (adult) (pediatric): Secondary | ICD-10-CM | POA: Diagnosis not present

## 2022-08-03 DIAGNOSIS — Z658 Other specified problems related to psychosocial circumstances: Secondary | ICD-10-CM | POA: Diagnosis not present

## 2022-08-03 DIAGNOSIS — L0100 Impetigo, unspecified: Secondary | ICD-10-CM | POA: Diagnosis not present

## 2022-08-03 DIAGNOSIS — Z6837 Body mass index (BMI) 37.0-37.9, adult: Secondary | ICD-10-CM | POA: Diagnosis not present

## 2022-08-07 DIAGNOSIS — G894 Chronic pain syndrome: Secondary | ICD-10-CM | POA: Diagnosis not present

## 2022-08-14 DIAGNOSIS — E559 Vitamin D deficiency, unspecified: Secondary | ICD-10-CM | POA: Diagnosis not present

## 2022-08-14 DIAGNOSIS — Z0000 Encounter for general adult medical examination without abnormal findings: Secondary | ICD-10-CM | POA: Diagnosis not present

## 2022-08-14 DIAGNOSIS — Z13220 Encounter for screening for lipoid disorders: Secondary | ICD-10-CM | POA: Diagnosis not present

## 2022-08-14 DIAGNOSIS — E039 Hypothyroidism, unspecified: Secondary | ICD-10-CM | POA: Diagnosis not present

## 2022-08-17 IMAGING — MR MR THORACIC SPINE W/O CM
4 of 7 series · 20 of 48 positions shown · non-contrast
Comparison: CT lumbar spine 12/14/2020, CT chest 09/17/2020

CLINICAL DATA: Sharp pain between the scapula for 1 year. No known
injury.

EXAM:
MRI THORACIC SPINE WITHOUT CONTRAST
TECHNIQUE: Multiplanar, multisequence MR imaging of the thoracic spine was
performed. No intravenous contrast was administered.

[Series 17: T1 · sagittal · 3.0mm · 0.89mm/px · 3 of 15 slices shown]
[im 1/15]
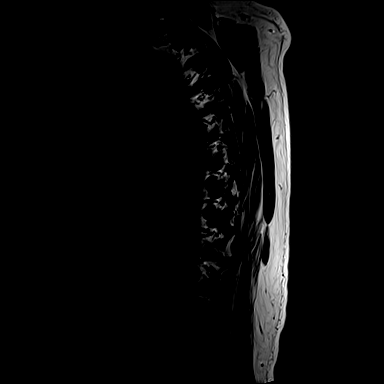
[im 8/15]
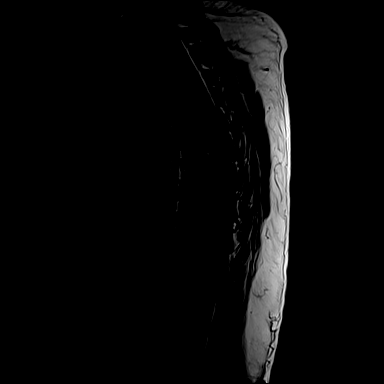
[im 15/15]
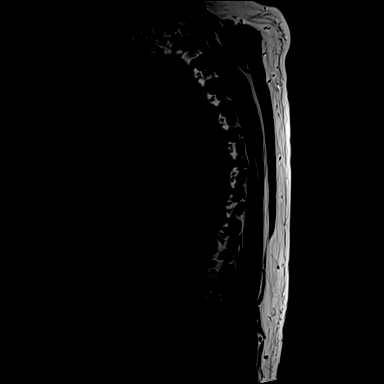

[Series 18: STIR · sagittal · 3.0mm · 1.06mm/px · 3 of 15 slices shown]
[im 1/15]
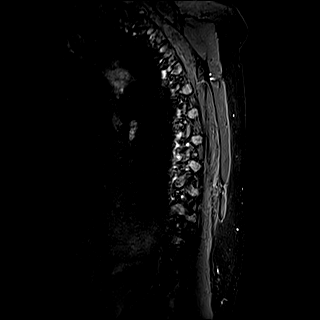
[im 8/15]
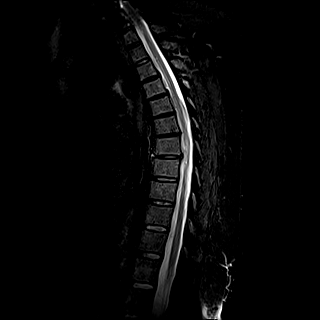
[im 15/15]
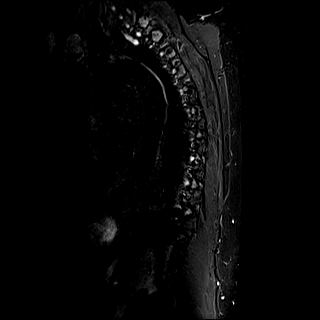

[Series 19: T2 · sagittal · 3.0mm · 0.89mm/px · 5 of 15 slices shown (1 of 2)]
[im 1/15]
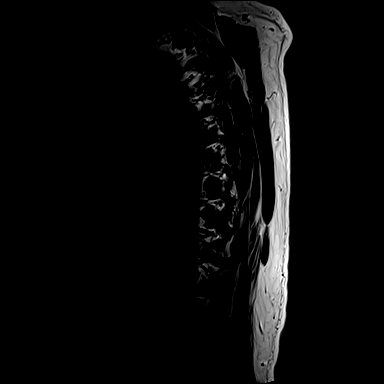
[im 4/15]
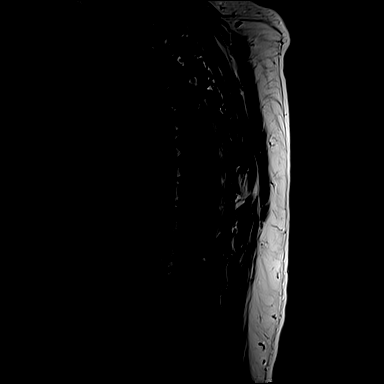
[im 8/15]
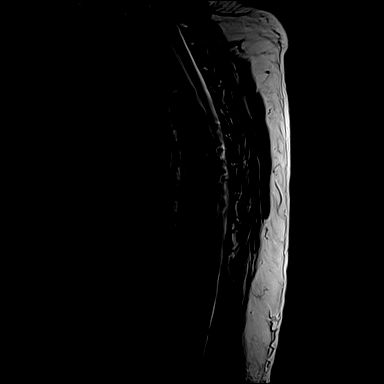
[im 11/15]
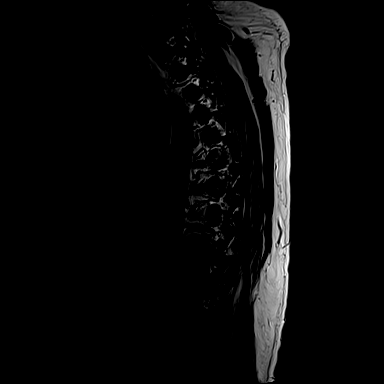
[im 15/15]
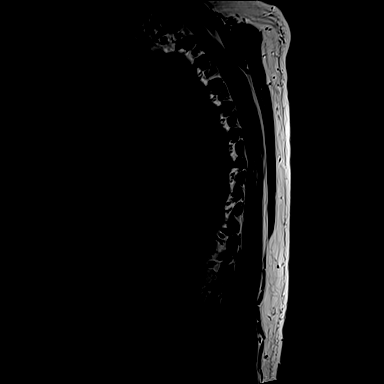

[Series 20: T2 · axial · 4.0mm · 0.28mm/px · z∈[-295,-117]mm · 9 of 33 slices shown (2 of 2)]
[im 1/33]
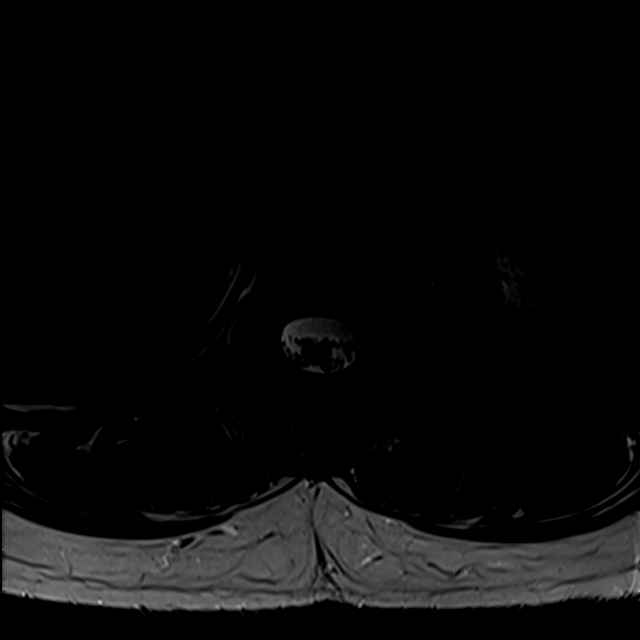
[im 4/33]
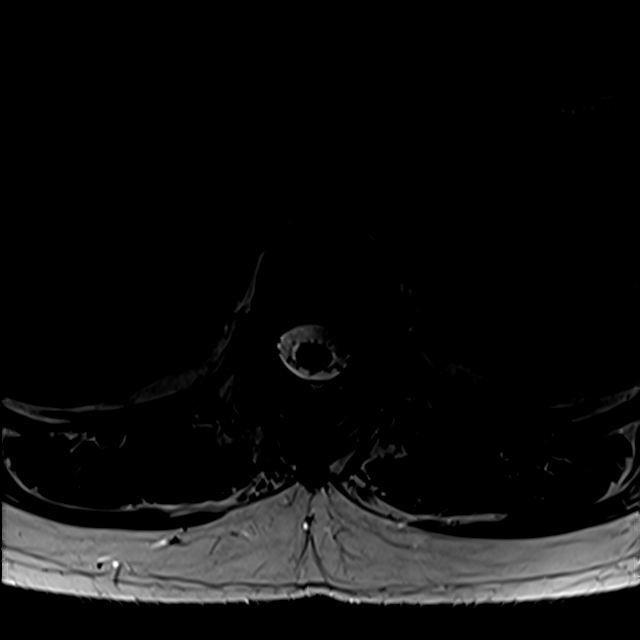
[im 7/33]
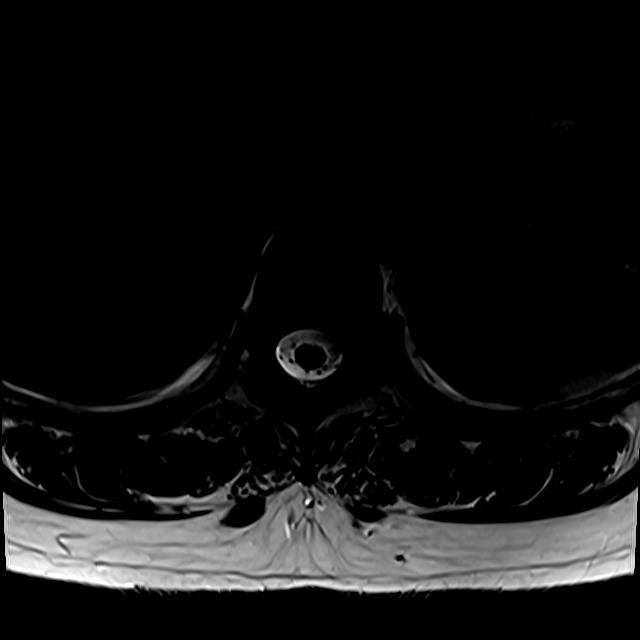
[im 10/33]
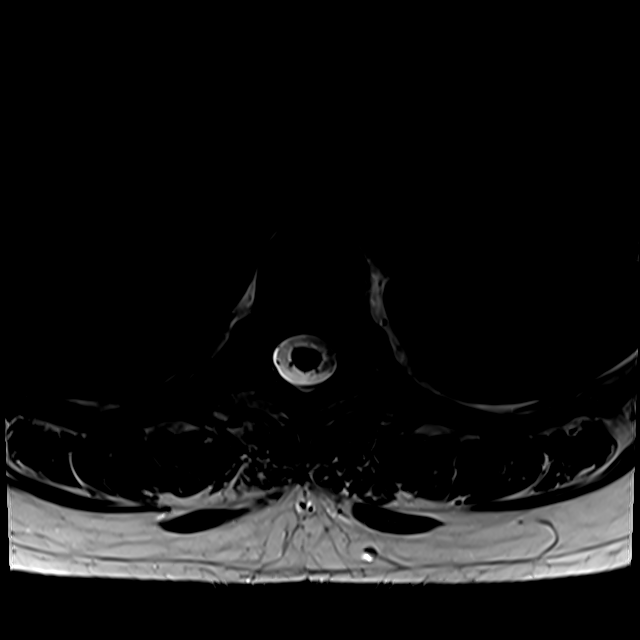
[im 13/33]
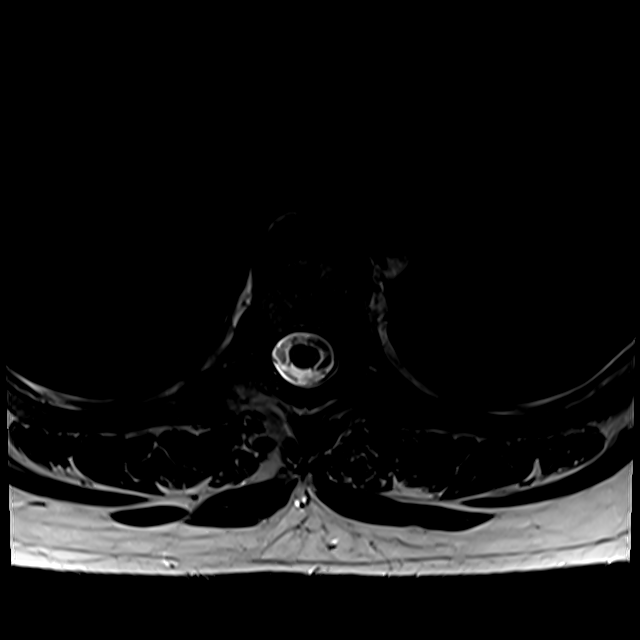
[im 17/33]
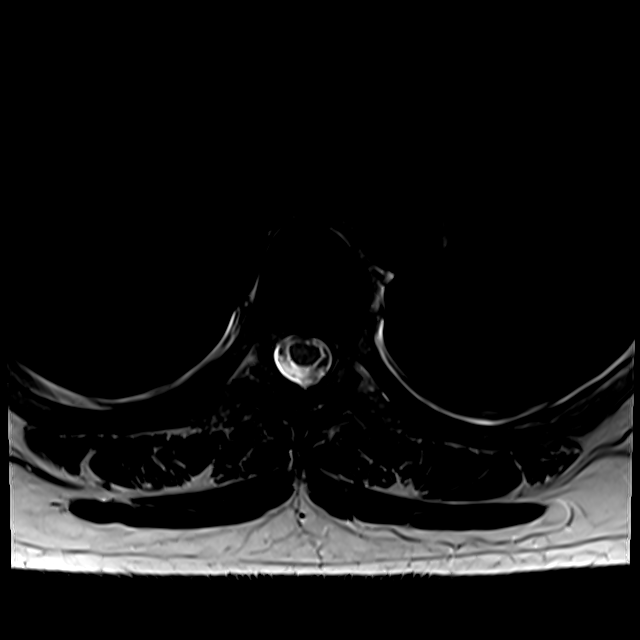
[im 20/33]
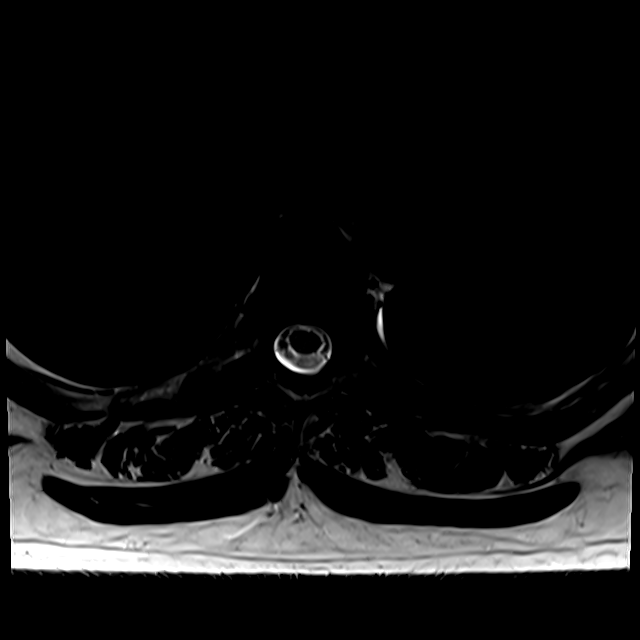
[im 23/33]
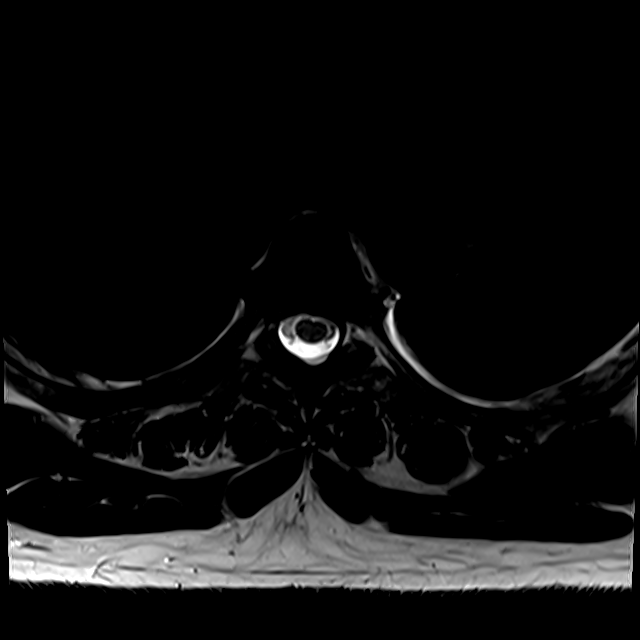
[im 29/33]
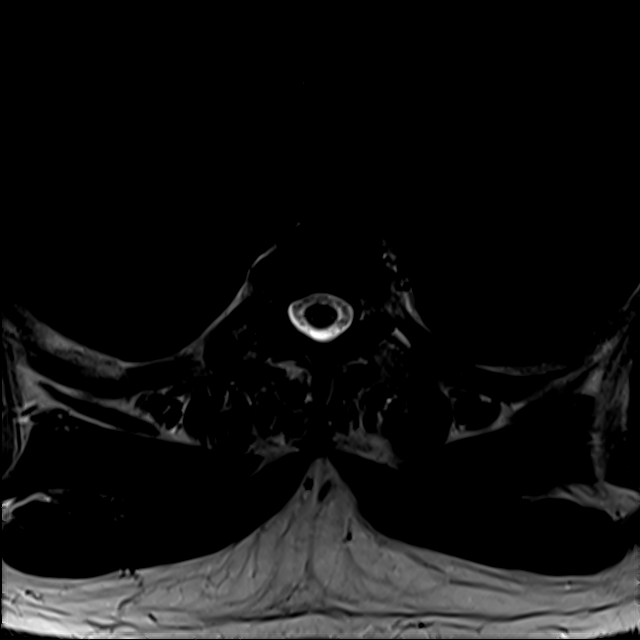

[20 of 48 positions shown; findings below may reference images not displayed]

FINDINGS: Alignment:  Physiologic.

Vertebrae: No acute fracture, evidence of discitis, or aggressive
bone lesion.

Cord:  Normal signal and morphology.

Paraspinal and other soft tissues: No acute paraspinal abnormality.

Disc levels:

Disc spaces: Degenerative disease with mild disc height loss at
T3-4, T4-5, T5-6 and C6-7.

T1-T2: No disc protrusion, foraminal stenosis or central canal
stenosis.

T2-T3: No disc protrusion, foraminal stenosis or central canal
stenosis.

T3-T4: Mild broad-based disc bulge. No foraminal or central canal
stenosis.

T4-T5: Mild broad-based disc bulge. No foraminal or central canal
stenosis.

T5-T6: Tiny central disc protrusion. No foraminal or central canal
stenosis.

T6-T7: Tiny left paracentral disc protrusion. No foraminal or
central canal stenosis.

T7-T8: Tiny central disc protrusion. No foraminal or central canal
stenosis.

T8-T9: No disc protrusion, foraminal stenosis or central canal
stenosis.

T9-T10: No disc protrusion, foraminal stenosis or central canal
stenosis.

T10-T11: No disc protrusion, foraminal stenosis or central canal
stenosis.

T11-T12: No disc protrusion, foraminal stenosis or central canal
stenosis.
IMPRESSION: 1. No acute osseous injury of the thoracic spine.
2. Mild thoracic spine spondylosis as described above.

## 2022-08-18 DIAGNOSIS — H6012 Cellulitis of left external ear: Secondary | ICD-10-CM | POA: Diagnosis not present

## 2022-08-18 DIAGNOSIS — Z0000 Encounter for general adult medical examination without abnormal findings: Secondary | ICD-10-CM | POA: Diagnosis not present

## 2022-08-18 DIAGNOSIS — R110 Nausea: Secondary | ICD-10-CM | POA: Diagnosis not present

## 2022-08-18 DIAGNOSIS — E7800 Pure hypercholesterolemia, unspecified: Secondary | ICD-10-CM | POA: Diagnosis not present

## 2022-08-18 DIAGNOSIS — K219 Gastro-esophageal reflux disease without esophagitis: Secondary | ICD-10-CM | POA: Diagnosis not present

## 2022-08-19 ENCOUNTER — Other Ambulatory Visit: Payer: Self-pay | Admitting: Family Medicine

## 2022-08-19 DIAGNOSIS — E2839 Other primary ovarian failure: Secondary | ICD-10-CM

## 2022-08-20 DIAGNOSIS — D0422 Carcinoma in situ of skin of left ear and external auricular canal: Secondary | ICD-10-CM | POA: Diagnosis not present

## 2022-08-20 DIAGNOSIS — L570 Actinic keratosis: Secondary | ICD-10-CM | POA: Diagnosis not present

## 2022-08-20 DIAGNOSIS — D492 Neoplasm of unspecified behavior of bone, soft tissue, and skin: Secondary | ICD-10-CM | POA: Diagnosis not present

## 2022-08-31 DIAGNOSIS — E039 Hypothyroidism, unspecified: Secondary | ICD-10-CM | POA: Diagnosis not present

## 2022-08-31 DIAGNOSIS — N951 Menopausal and female climacteric states: Secondary | ICD-10-CM | POA: Diagnosis not present

## 2022-08-31 DIAGNOSIS — F3341 Major depressive disorder, recurrent, in partial remission: Secondary | ICD-10-CM | POA: Diagnosis not present

## 2022-08-31 DIAGNOSIS — F410 Panic disorder [episodic paroxysmal anxiety] without agoraphobia: Secondary | ICD-10-CM | POA: Diagnosis not present

## 2022-08-31 DIAGNOSIS — F5101 Primary insomnia: Secondary | ICD-10-CM | POA: Diagnosis not present

## 2022-08-31 DIAGNOSIS — F900 Attention-deficit hyperactivity disorder, predominantly inattentive type: Secondary | ICD-10-CM | POA: Diagnosis not present

## 2022-09-01 DIAGNOSIS — D0422 Carcinoma in situ of skin of left ear and external auricular canal: Secondary | ICD-10-CM | POA: Diagnosis not present

## 2022-09-02 DIAGNOSIS — Z79890 Hormone replacement therapy (postmenopausal): Secondary | ICD-10-CM | POA: Diagnosis not present

## 2022-09-02 DIAGNOSIS — R61 Generalized hyperhidrosis: Secondary | ICD-10-CM | POA: Diagnosis not present

## 2022-09-02 DIAGNOSIS — N951 Menopausal and female climacteric states: Secondary | ICD-10-CM | POA: Diagnosis not present

## 2022-09-02 DIAGNOSIS — R232 Flushing: Secondary | ICD-10-CM | POA: Diagnosis not present

## 2022-09-04 DIAGNOSIS — G894 Chronic pain syndrome: Secondary | ICD-10-CM | POA: Diagnosis not present

## 2022-09-07 ENCOUNTER — Other Ambulatory Visit: Payer: Self-pay | Admitting: Anesthesiology

## 2022-09-07 DIAGNOSIS — M5440 Lumbago with sciatica, unspecified side: Secondary | ICD-10-CM

## 2022-09-07 DIAGNOSIS — M961 Postlaminectomy syndrome, not elsewhere classified: Secondary | ICD-10-CM

## 2022-09-12 DIAGNOSIS — H6692 Otitis media, unspecified, left ear: Secondary | ICD-10-CM | POA: Diagnosis not present

## 2022-09-17 DIAGNOSIS — H6012 Cellulitis of left external ear: Secondary | ICD-10-CM | POA: Diagnosis not present

## 2022-09-17 DIAGNOSIS — R7301 Impaired fasting glucose: Secondary | ICD-10-CM | POA: Diagnosis not present

## 2022-09-18 ENCOUNTER — Other Ambulatory Visit: Payer: BC Managed Care – PPO

## 2022-09-26 DIAGNOSIS — G4733 Obstructive sleep apnea (adult) (pediatric): Secondary | ICD-10-CM | POA: Diagnosis not present

## 2022-10-01 ENCOUNTER — Ambulatory Visit
Admission: RE | Admit: 2022-10-01 | Discharge: 2022-10-01 | Disposition: A | Discharge status: Home / Self Care | Payer: BC Managed Care – PPO | Source: Ambulatory Visit | Attending: Anesthesiology | Admitting: Anesthesiology

## 2022-10-01 DIAGNOSIS — M961 Postlaminectomy syndrome, not elsewhere classified: Secondary | ICD-10-CM

## 2022-10-01 DIAGNOSIS — M48061 Spinal stenosis, lumbar region without neurogenic claudication: Secondary | ICD-10-CM | POA: Diagnosis not present

## 2022-10-01 DIAGNOSIS — M4726 Other spondylosis with radiculopathy, lumbar region: Secondary | ICD-10-CM | POA: Diagnosis not present

## 2022-10-01 DIAGNOSIS — M5440 Lumbago with sciatica, unspecified side: Secondary | ICD-10-CM

## 2022-10-01 DIAGNOSIS — M5116 Intervertebral disc disorders with radiculopathy, lumbar region: Secondary | ICD-10-CM | POA: Diagnosis not present

## 2022-10-01 MED ORDER — GADOPICLENOL 0.5 MMOL/ML IV SOLN
10.0000 mL | Freq: Once | INTRAVENOUS | Status: AC | PRN
  Administered 2022-10-01: 10 mL via INTRAVENOUS

## 2022-10-13 DIAGNOSIS — I517 Cardiomegaly: Secondary | ICD-10-CM | POA: Diagnosis not present

## 2022-10-15 DIAGNOSIS — Z6839 Body mass index (BMI) 39.0-39.9, adult: Secondary | ICD-10-CM | POA: Diagnosis not present

## 2022-10-15 DIAGNOSIS — E039 Hypothyroidism, unspecified: Secondary | ICD-10-CM | POA: Diagnosis not present

## 2022-10-15 DIAGNOSIS — R635 Abnormal weight gain: Secondary | ICD-10-CM | POA: Diagnosis not present

## 2022-10-15 DIAGNOSIS — E611 Iron deficiency: Secondary | ICD-10-CM | POA: Diagnosis not present

## 2022-10-15 DIAGNOSIS — E7800 Pure hypercholesterolemia, unspecified: Secondary | ICD-10-CM | POA: Diagnosis not present

## 2022-10-15 DIAGNOSIS — E559 Vitamin D deficiency, unspecified: Secondary | ICD-10-CM | POA: Diagnosis not present

## 2022-10-15 DIAGNOSIS — I10 Essential (primary) hypertension: Secondary | ICD-10-CM | POA: Diagnosis not present

## 2022-10-20 DIAGNOSIS — Z6838 Body mass index (BMI) 38.0-38.9, adult: Secondary | ICD-10-CM | POA: Diagnosis not present

## 2022-10-20 DIAGNOSIS — N951 Menopausal and female climacteric states: Secondary | ICD-10-CM | POA: Diagnosis not present

## 2022-10-20 DIAGNOSIS — Z1339 Encounter for screening examination for other mental health and behavioral disorders: Secondary | ICD-10-CM | POA: Diagnosis not present

## 2022-10-20 DIAGNOSIS — E039 Hypothyroidism, unspecified: Secondary | ICD-10-CM | POA: Diagnosis not present

## 2022-10-20 DIAGNOSIS — Z1331 Encounter for screening for depression: Secondary | ICD-10-CM | POA: Diagnosis not present

## 2022-10-27 DIAGNOSIS — G4733 Obstructive sleep apnea (adult) (pediatric): Secondary | ICD-10-CM | POA: Diagnosis not present

## 2022-10-28 DIAGNOSIS — E7800 Pure hypercholesterolemia, unspecified: Secondary | ICD-10-CM | POA: Diagnosis not present

## 2022-10-28 DIAGNOSIS — Z6838 Body mass index (BMI) 38.0-38.9, adult: Secondary | ICD-10-CM | POA: Diagnosis not present

## 2022-10-30 DIAGNOSIS — F900 Attention-deficit hyperactivity disorder, predominantly inattentive type: Secondary | ICD-10-CM | POA: Diagnosis not present

## 2022-10-30 DIAGNOSIS — F410 Panic disorder [episodic paroxysmal anxiety] without agoraphobia: Secondary | ICD-10-CM | POA: Diagnosis not present

## 2022-10-30 DIAGNOSIS — F5101 Primary insomnia: Secondary | ICD-10-CM | POA: Diagnosis not present

## 2022-10-30 DIAGNOSIS — F3341 Major depressive disorder, recurrent, in partial remission: Secondary | ICD-10-CM | POA: Diagnosis not present

## 2022-11-09 DIAGNOSIS — Z6837 Body mass index (BMI) 37.0-37.9, adult: Secondary | ICD-10-CM | POA: Diagnosis not present

## 2022-11-09 DIAGNOSIS — I10 Essential (primary) hypertension: Secondary | ICD-10-CM | POA: Diagnosis not present

## 2022-11-19 ENCOUNTER — Other Ambulatory Visit: Payer: Self-pay | Admitting: Gastroenterology

## 2022-11-19 DIAGNOSIS — R1310 Dysphagia, unspecified: Secondary | ICD-10-CM | POA: Diagnosis not present

## 2022-11-24 DIAGNOSIS — E039 Hypothyroidism, unspecified: Secondary | ICD-10-CM | POA: Diagnosis not present

## 2022-11-24 DIAGNOSIS — E559 Vitamin D deficiency, unspecified: Secondary | ICD-10-CM | POA: Diagnosis not present

## 2022-11-24 DIAGNOSIS — E7800 Pure hypercholesterolemia, unspecified: Secondary | ICD-10-CM | POA: Diagnosis not present

## 2022-11-24 DIAGNOSIS — N951 Menopausal and female climacteric states: Secondary | ICD-10-CM | POA: Diagnosis not present

## 2022-11-24 DIAGNOSIS — Z6838 Body mass index (BMI) 38.0-38.9, adult: Secondary | ICD-10-CM | POA: Diagnosis not present

## 2022-11-26 DIAGNOSIS — G4733 Obstructive sleep apnea (adult) (pediatric): Secondary | ICD-10-CM | POA: Diagnosis not present

## 2022-12-02 DIAGNOSIS — Z6838 Body mass index (BMI) 38.0-38.9, adult: Secondary | ICD-10-CM | POA: Diagnosis not present

## 2022-12-02 DIAGNOSIS — N951 Menopausal and female climacteric states: Secondary | ICD-10-CM | POA: Diagnosis not present

## 2022-12-02 DIAGNOSIS — R232 Flushing: Secondary | ICD-10-CM | POA: Diagnosis not present

## 2022-12-02 DIAGNOSIS — G479 Sleep disorder, unspecified: Secondary | ICD-10-CM | POA: Diagnosis not present

## 2022-12-03 DIAGNOSIS — M79605 Pain in left leg: Secondary | ICD-10-CM | POA: Diagnosis not present

## 2022-12-03 DIAGNOSIS — Z79899 Other long term (current) drug therapy: Secondary | ICD-10-CM | POA: Diagnosis not present

## 2022-12-03 DIAGNOSIS — M545 Low back pain, unspecified: Secondary | ICD-10-CM | POA: Diagnosis not present

## 2022-12-03 DIAGNOSIS — G894 Chronic pain syndrome: Secondary | ICD-10-CM | POA: Diagnosis not present

## 2022-12-03 DIAGNOSIS — Z79891 Long term (current) use of opiate analgesic: Secondary | ICD-10-CM | POA: Diagnosis not present

## 2022-12-03 DIAGNOSIS — M79662 Pain in left lower leg: Secondary | ICD-10-CM | POA: Diagnosis not present

## 2022-12-03 DIAGNOSIS — M79602 Pain in left arm: Secondary | ICD-10-CM | POA: Diagnosis not present

## 2022-12-10 DIAGNOSIS — E7800 Pure hypercholesterolemia, unspecified: Secondary | ICD-10-CM | POA: Diagnosis not present

## 2022-12-10 DIAGNOSIS — Z6838 Body mass index (BMI) 38.0-38.9, adult: Secondary | ICD-10-CM | POA: Diagnosis not present

## 2022-12-14 ENCOUNTER — Inpatient Hospital Stay: Admission: RE | Admit: 2022-12-14 | Payer: BC Managed Care – PPO | Source: Ambulatory Visit

## 2022-12-22 DIAGNOSIS — E7800 Pure hypercholesterolemia, unspecified: Secondary | ICD-10-CM | POA: Diagnosis not present

## 2022-12-25 ENCOUNTER — Other Ambulatory Visit: Payer: BC Managed Care – PPO

## 2023-01-04 ENCOUNTER — Other Ambulatory Visit: Payer: BC Managed Care – PPO

## 2023-01-04 DIAGNOSIS — M25512 Pain in left shoulder: Secondary | ICD-10-CM | POA: Diagnosis not present

## 2023-01-04 DIAGNOSIS — M79602 Pain in left arm: Secondary | ICD-10-CM | POA: Diagnosis not present

## 2023-01-04 DIAGNOSIS — Z79891 Long term (current) use of opiate analgesic: Secondary | ICD-10-CM | POA: Diagnosis not present

## 2023-01-04 DIAGNOSIS — G894 Chronic pain syndrome: Secondary | ICD-10-CM | POA: Diagnosis not present

## 2023-01-04 DIAGNOSIS — Z79899 Other long term (current) drug therapy: Secondary | ICD-10-CM | POA: Diagnosis not present

## 2023-01-04 DIAGNOSIS — M542 Cervicalgia: Secondary | ICD-10-CM | POA: Diagnosis not present

## 2023-01-04 DIAGNOSIS — M79622 Pain in left upper arm: Secondary | ICD-10-CM | POA: Diagnosis not present

## 2023-01-07 DIAGNOSIS — Z6837 Body mass index (BMI) 37.0-37.9, adult: Secondary | ICD-10-CM | POA: Diagnosis not present

## 2023-01-07 DIAGNOSIS — I10 Essential (primary) hypertension: Secondary | ICD-10-CM | POA: Diagnosis not present

## 2023-01-08 DIAGNOSIS — G4733 Obstructive sleep apnea (adult) (pediatric): Secondary | ICD-10-CM | POA: Diagnosis not present

## 2023-01-19 DIAGNOSIS — G4733 Obstructive sleep apnea (adult) (pediatric): Secondary | ICD-10-CM | POA: Diagnosis not present

## 2023-01-21 DIAGNOSIS — E559 Vitamin D deficiency, unspecified: Secondary | ICD-10-CM | POA: Diagnosis not present

## 2023-01-21 DIAGNOSIS — Z6836 Body mass index (BMI) 36.0-36.9, adult: Secondary | ICD-10-CM | POA: Diagnosis not present

## 2023-01-21 DIAGNOSIS — M67911 Unspecified disorder of synovium and tendon, right shoulder: Secondary | ICD-10-CM | POA: Diagnosis not present

## 2023-01-21 DIAGNOSIS — L578 Other skin changes due to chronic exposure to nonionizing radiation: Secondary | ICD-10-CM | POA: Diagnosis not present

## 2023-01-21 DIAGNOSIS — E7800 Pure hypercholesterolemia, unspecified: Secondary | ICD-10-CM | POA: Diagnosis not present

## 2023-01-21 DIAGNOSIS — L821 Other seborrheic keratosis: Secondary | ICD-10-CM | POA: Diagnosis not present

## 2023-01-21 DIAGNOSIS — L814 Other melanin hyperpigmentation: Secondary | ICD-10-CM | POA: Diagnosis not present

## 2023-01-21 DIAGNOSIS — L570 Actinic keratosis: Secondary | ICD-10-CM | POA: Diagnosis not present

## 2023-01-21 DIAGNOSIS — D225 Melanocytic nevi of trunk: Secondary | ICD-10-CM | POA: Diagnosis not present

## 2023-01-21 DIAGNOSIS — M1712 Unilateral primary osteoarthritis, left knee: Secondary | ICD-10-CM | POA: Diagnosis not present

## 2023-01-26 ENCOUNTER — Other Ambulatory Visit: Payer: Self-pay | Admitting: Orthopedic Surgery

## 2023-01-26 DIAGNOSIS — M25511 Pain in right shoulder: Secondary | ICD-10-CM

## 2023-01-29 ENCOUNTER — Inpatient Hospital Stay: Admission: RE | Admit: 2023-01-29 | Payer: BC Managed Care – PPO | Source: Ambulatory Visit

## 2023-02-02 DIAGNOSIS — G894 Chronic pain syndrome: Secondary | ICD-10-CM | POA: Diagnosis not present

## 2023-02-02 DIAGNOSIS — M79605 Pain in left leg: Secondary | ICD-10-CM | POA: Diagnosis not present

## 2023-02-02 DIAGNOSIS — M79602 Pain in left arm: Secondary | ICD-10-CM | POA: Diagnosis not present

## 2023-02-02 DIAGNOSIS — Z79899 Other long term (current) drug therapy: Secondary | ICD-10-CM | POA: Diagnosis not present

## 2023-02-02 DIAGNOSIS — M545 Low back pain, unspecified: Secondary | ICD-10-CM | POA: Diagnosis not present

## 2023-02-02 DIAGNOSIS — Z79891 Long term (current) use of opiate analgesic: Secondary | ICD-10-CM | POA: Diagnosis not present

## 2023-02-02 DIAGNOSIS — M79662 Pain in left lower leg: Secondary | ICD-10-CM | POA: Diagnosis not present

## 2023-02-03 DIAGNOSIS — E7800 Pure hypercholesterolemia, unspecified: Secondary | ICD-10-CM | POA: Diagnosis not present

## 2023-02-03 DIAGNOSIS — Z6836 Body mass index (BMI) 36.0-36.9, adult: Secondary | ICD-10-CM | POA: Diagnosis not present

## 2023-02-03 DIAGNOSIS — I10 Essential (primary) hypertension: Secondary | ICD-10-CM | POA: Diagnosis not present

## 2023-02-07 DIAGNOSIS — E860 Dehydration: Secondary | ICD-10-CM | POA: Diagnosis not present

## 2023-02-07 DIAGNOSIS — M542 Cervicalgia: Secondary | ICD-10-CM | POA: Diagnosis not present

## 2023-02-07 DIAGNOSIS — R202 Paresthesia of skin: Secondary | ICD-10-CM | POA: Diagnosis not present

## 2023-02-07 DIAGNOSIS — R531 Weakness: Secondary | ICD-10-CM | POA: Diagnosis not present

## 2023-02-07 DIAGNOSIS — I959 Hypotension, unspecified: Secondary | ICD-10-CM | POA: Diagnosis not present

## 2023-02-07 DIAGNOSIS — R519 Headache, unspecified: Secondary | ICD-10-CM | POA: Diagnosis not present

## 2023-02-07 DIAGNOSIS — R82998 Other abnormal findings in urine: Secondary | ICD-10-CM | POA: Diagnosis not present

## 2023-02-07 DIAGNOSIS — G4489 Other headache syndrome: Secondary | ICD-10-CM | POA: Diagnosis not present

## 2023-02-07 DIAGNOSIS — G8929 Other chronic pain: Secondary | ICD-10-CM | POA: Diagnosis not present

## 2023-02-08 DIAGNOSIS — G4733 Obstructive sleep apnea (adult) (pediatric): Secondary | ICD-10-CM | POA: Diagnosis not present

## 2023-02-08 DIAGNOSIS — R7301 Impaired fasting glucose: Secondary | ICD-10-CM | POA: Diagnosis not present

## 2023-02-08 DIAGNOSIS — I10 Essential (primary) hypertension: Secondary | ICD-10-CM | POA: Diagnosis not present

## 2023-02-08 DIAGNOSIS — I959 Hypotension, unspecified: Secondary | ICD-10-CM | POA: Diagnosis not present

## 2023-02-08 DIAGNOSIS — E039 Hypothyroidism, unspecified: Secondary | ICD-10-CM | POA: Diagnosis not present

## 2023-02-08 DIAGNOSIS — E7800 Pure hypercholesterolemia, unspecified: Secondary | ICD-10-CM | POA: Diagnosis not present

## 2023-02-09 ENCOUNTER — Ambulatory Visit
Admission: RE | Admit: 2023-02-09 | Discharge: 2023-02-09 | Disposition: A | Discharge status: Home / Self Care | Payer: BC Managed Care – PPO | Source: Ambulatory Visit | Attending: Family Medicine | Admitting: Family Medicine

## 2023-02-09 DIAGNOSIS — E2839 Other primary ovarian failure: Secondary | ICD-10-CM

## 2023-02-09 DIAGNOSIS — N958 Other specified menopausal and perimenopausal disorders: Secondary | ICD-10-CM | POA: Diagnosis not present

## 2023-02-09 DIAGNOSIS — Z90722 Acquired absence of ovaries, bilateral: Secondary | ICD-10-CM | POA: Diagnosis not present

## 2023-02-09 DIAGNOSIS — E349 Endocrine disorder, unspecified: Secondary | ICD-10-CM | POA: Diagnosis not present

## 2023-02-11 ENCOUNTER — Other Ambulatory Visit: Payer: BC Managed Care – PPO

## 2023-02-17 ENCOUNTER — Ambulatory Visit
Admission: RE | Admit: 2023-02-17 | Discharge: 2023-02-17 | Disposition: A | Discharge status: Home / Self Care | Payer: BC Managed Care – PPO | Source: Ambulatory Visit | Attending: Orthopedic Surgery | Admitting: Orthopedic Surgery

## 2023-02-17 DIAGNOSIS — M25511 Pain in right shoulder: Secondary | ICD-10-CM | POA: Diagnosis not present

## 2023-02-17 DIAGNOSIS — S46011A Strain of muscle(s) and tendon(s) of the rotator cuff of right shoulder, initial encounter: Secondary | ICD-10-CM | POA: Diagnosis not present

## 2023-02-18 DIAGNOSIS — R399 Unspecified symptoms and signs involving the genitourinary system: Secondary | ICD-10-CM | POA: Diagnosis not present

## 2023-02-23 DIAGNOSIS — Z6835 Body mass index (BMI) 35.0-35.9, adult: Secondary | ICD-10-CM | POA: Diagnosis not present

## 2023-02-23 DIAGNOSIS — E559 Vitamin D deficiency, unspecified: Secondary | ICD-10-CM | POA: Diagnosis not present

## 2023-03-02 DIAGNOSIS — E039 Hypothyroidism, unspecified: Secondary | ICD-10-CM | POA: Diagnosis not present

## 2023-03-02 DIAGNOSIS — N951 Menopausal and female climacteric states: Secondary | ICD-10-CM | POA: Diagnosis not present

## 2023-03-02 DIAGNOSIS — M67911 Unspecified disorder of synovium and tendon, right shoulder: Secondary | ICD-10-CM | POA: Diagnosis not present

## 2023-03-04 DIAGNOSIS — Z79891 Long term (current) use of opiate analgesic: Secondary | ICD-10-CM | POA: Diagnosis not present

## 2023-03-04 DIAGNOSIS — R6882 Decreased libido: Secondary | ICD-10-CM | POA: Diagnosis not present

## 2023-03-04 DIAGNOSIS — G894 Chronic pain syndrome: Secondary | ICD-10-CM | POA: Diagnosis not present

## 2023-03-04 DIAGNOSIS — N951 Menopausal and female climacteric states: Secondary | ICD-10-CM | POA: Diagnosis not present

## 2023-03-04 DIAGNOSIS — E039 Hypothyroidism, unspecified: Secondary | ICD-10-CM | POA: Diagnosis not present

## 2023-03-04 DIAGNOSIS — R232 Flushing: Secondary | ICD-10-CM | POA: Diagnosis not present

## 2023-03-05 DIAGNOSIS — F410 Panic disorder [episodic paroxysmal anxiety] without agoraphobia: Secondary | ICD-10-CM | POA: Diagnosis not present

## 2023-03-05 DIAGNOSIS — F5101 Primary insomnia: Secondary | ICD-10-CM | POA: Diagnosis not present

## 2023-03-05 DIAGNOSIS — F3342 Major depressive disorder, recurrent, in full remission: Secondary | ICD-10-CM | POA: Diagnosis not present

## 2023-03-05 DIAGNOSIS — F900 Attention-deficit hyperactivity disorder, predominantly inattentive type: Secondary | ICD-10-CM | POA: Diagnosis not present

## 2023-03-08 DIAGNOSIS — Z1231 Encounter for screening mammogram for malignant neoplasm of breast: Secondary | ICD-10-CM | POA: Diagnosis not present

## 2023-03-08 DIAGNOSIS — Z01419 Encounter for gynecological examination (general) (routine) without abnormal findings: Secondary | ICD-10-CM | POA: Diagnosis not present

## 2023-03-08 DIAGNOSIS — Z130 Encounter for screening for diseases of the blood and blood-forming organs and certain disorders involving the immune mechanism: Secondary | ICD-10-CM | POA: Diagnosis not present

## 2023-03-09 ENCOUNTER — Inpatient Hospital Stay: Admission: RE | Admit: 2023-03-09 | Payer: BC Managed Care – PPO | Source: Ambulatory Visit

## 2023-03-11 DIAGNOSIS — G4733 Obstructive sleep apnea (adult) (pediatric): Secondary | ICD-10-CM | POA: Diagnosis not present

## 2023-03-15 DIAGNOSIS — L0101 Non-bullous impetigo: Secondary | ICD-10-CM | POA: Diagnosis not present

## 2023-03-15 DIAGNOSIS — L570 Actinic keratosis: Secondary | ICD-10-CM | POA: Diagnosis not present

## 2023-03-15 DIAGNOSIS — L814 Other melanin hyperpigmentation: Secondary | ICD-10-CM | POA: Diagnosis not present

## 2023-03-15 DIAGNOSIS — L718 Other rosacea: Secondary | ICD-10-CM | POA: Diagnosis not present

## 2023-03-22 DIAGNOSIS — L0100 Impetigo, unspecified: Secondary | ICD-10-CM | POA: Diagnosis not present

## 2023-03-22 DIAGNOSIS — R110 Nausea: Secondary | ICD-10-CM | POA: Diagnosis not present

## 2023-03-23 DIAGNOSIS — Z6835 Body mass index (BMI) 35.0-35.9, adult: Secondary | ICD-10-CM | POA: Diagnosis not present

## 2023-03-23 DIAGNOSIS — M5416 Radiculopathy, lumbar region: Secondary | ICD-10-CM | POA: Diagnosis not present

## 2023-03-23 DIAGNOSIS — M4316 Spondylolisthesis, lumbar region: Secondary | ICD-10-CM | POA: Diagnosis not present

## 2023-03-24 DIAGNOSIS — S46011A Strain of muscle(s) and tendon(s) of the rotator cuff of right shoulder, initial encounter: Secondary | ICD-10-CM | POA: Diagnosis not present

## 2023-03-24 DIAGNOSIS — M7521 Bicipital tendinitis, right shoulder: Secondary | ICD-10-CM | POA: Diagnosis not present

## 2023-03-24 DIAGNOSIS — M19011 Primary osteoarthritis, right shoulder: Secondary | ICD-10-CM | POA: Diagnosis not present

## 2023-03-24 DIAGNOSIS — G8918 Other acute postprocedural pain: Secondary | ICD-10-CM | POA: Diagnosis not present

## 2023-03-24 DIAGNOSIS — S43431A Superior glenoid labrum lesion of right shoulder, initial encounter: Secondary | ICD-10-CM | POA: Diagnosis not present

## 2023-03-24 DIAGNOSIS — M7541 Impingement syndrome of right shoulder: Secondary | ICD-10-CM | POA: Diagnosis not present

## 2023-03-24 DIAGNOSIS — M2342 Loose body in knee, left knee: Secondary | ICD-10-CM | POA: Diagnosis not present

## 2023-03-24 DIAGNOSIS — M6752 Plica syndrome, left knee: Secondary | ICD-10-CM | POA: Diagnosis not present

## 2023-03-24 DIAGNOSIS — M94262 Chondromalacia, left knee: Secondary | ICD-10-CM | POA: Diagnosis not present

## 2023-03-24 DIAGNOSIS — Y999 Unspecified external cause status: Secondary | ICD-10-CM | POA: Diagnosis not present

## 2023-03-24 DIAGNOSIS — X58XXXA Exposure to other specified factors, initial encounter: Secondary | ICD-10-CM | POA: Diagnosis not present

## 2023-03-24 HISTORY — PX: ROTATOR CUFF REPAIR: SHX139

## 2023-03-30 DIAGNOSIS — M6281 Muscle weakness (generalized): Secondary | ICD-10-CM | POA: Diagnosis not present

## 2023-03-30 DIAGNOSIS — M545 Low back pain, unspecified: Secondary | ICD-10-CM | POA: Diagnosis not present

## 2023-03-30 DIAGNOSIS — M25511 Pain in right shoulder: Secondary | ICD-10-CM | POA: Diagnosis not present

## 2023-03-30 DIAGNOSIS — M67911 Unspecified disorder of synovium and tendon, right shoulder: Secondary | ICD-10-CM | POA: Diagnosis not present

## 2023-04-02 DIAGNOSIS — M545 Low back pain, unspecified: Secondary | ICD-10-CM | POA: Diagnosis not present

## 2023-04-02 DIAGNOSIS — M6281 Muscle weakness (generalized): Secondary | ICD-10-CM | POA: Diagnosis not present

## 2023-04-02 DIAGNOSIS — E7800 Pure hypercholesterolemia, unspecified: Secondary | ICD-10-CM | POA: Diagnosis not present

## 2023-04-02 DIAGNOSIS — Z6833 Body mass index (BMI) 33.0-33.9, adult: Secondary | ICD-10-CM | POA: Diagnosis not present

## 2023-04-02 DIAGNOSIS — M25511 Pain in right shoulder: Secondary | ICD-10-CM | POA: Diagnosis not present

## 2023-04-05 DIAGNOSIS — I4510 Unspecified right bundle-branch block: Secondary | ICD-10-CM | POA: Diagnosis not present

## 2023-04-05 DIAGNOSIS — I493 Ventricular premature depolarization: Secondary | ICD-10-CM | POA: Diagnosis not present

## 2023-04-05 DIAGNOSIS — I456 Pre-excitation syndrome: Secondary | ICD-10-CM | POA: Diagnosis not present

## 2023-04-05 DIAGNOSIS — Z6834 Body mass index (BMI) 34.0-34.9, adult: Secondary | ICD-10-CM | POA: Diagnosis not present

## 2023-04-05 DIAGNOSIS — E669 Obesity, unspecified: Secondary | ICD-10-CM | POA: Diagnosis not present

## 2023-04-06 DIAGNOSIS — M6281 Muscle weakness (generalized): Secondary | ICD-10-CM | POA: Diagnosis not present

## 2023-04-06 DIAGNOSIS — M25511 Pain in right shoulder: Secondary | ICD-10-CM | POA: Diagnosis not present

## 2023-04-06 DIAGNOSIS — M545 Low back pain, unspecified: Secondary | ICD-10-CM | POA: Diagnosis not present

## 2023-04-08 DIAGNOSIS — G4733 Obstructive sleep apnea (adult) (pediatric): Secondary | ICD-10-CM | POA: Diagnosis not present

## 2023-04-09 DIAGNOSIS — M25511 Pain in right shoulder: Secondary | ICD-10-CM | POA: Diagnosis not present

## 2023-04-09 DIAGNOSIS — M6281 Muscle weakness (generalized): Secondary | ICD-10-CM | POA: Diagnosis not present

## 2023-04-09 DIAGNOSIS — M545 Low back pain, unspecified: Secondary | ICD-10-CM | POA: Diagnosis not present

## 2023-04-10 DIAGNOSIS — G4733 Obstructive sleep apnea (adult) (pediatric): Secondary | ICD-10-CM | POA: Diagnosis not present

## 2023-04-13 DIAGNOSIS — M6281 Muscle weakness (generalized): Secondary | ICD-10-CM | POA: Diagnosis not present

## 2023-04-13 DIAGNOSIS — M545 Low back pain, unspecified: Secondary | ICD-10-CM | POA: Diagnosis not present

## 2023-04-13 DIAGNOSIS — M25511 Pain in right shoulder: Secondary | ICD-10-CM | POA: Diagnosis not present

## 2023-04-16 DIAGNOSIS — M6281 Muscle weakness (generalized): Secondary | ICD-10-CM | POA: Diagnosis not present

## 2023-04-16 DIAGNOSIS — Z23 Encounter for immunization: Secondary | ICD-10-CM | POA: Diagnosis not present

## 2023-04-16 DIAGNOSIS — M25511 Pain in right shoulder: Secondary | ICD-10-CM | POA: Diagnosis not present

## 2023-04-16 DIAGNOSIS — M545 Low back pain, unspecified: Secondary | ICD-10-CM | POA: Diagnosis not present

## 2023-04-16 DIAGNOSIS — H9392 Unspecified disorder of left ear: Secondary | ICD-10-CM | POA: Diagnosis not present

## 2023-04-19 DIAGNOSIS — M545 Low back pain, unspecified: Secondary | ICD-10-CM | POA: Diagnosis not present

## 2023-04-19 DIAGNOSIS — M25511 Pain in right shoulder: Secondary | ICD-10-CM | POA: Diagnosis not present

## 2023-04-19 DIAGNOSIS — M6281 Muscle weakness (generalized): Secondary | ICD-10-CM | POA: Diagnosis not present

## 2023-04-21 DIAGNOSIS — M25511 Pain in right shoulder: Secondary | ICD-10-CM | POA: Diagnosis not present

## 2023-04-21 DIAGNOSIS — M545 Low back pain, unspecified: Secondary | ICD-10-CM | POA: Diagnosis not present

## 2023-04-21 DIAGNOSIS — M6281 Muscle weakness (generalized): Secondary | ICD-10-CM | POA: Diagnosis not present

## 2023-04-22 DIAGNOSIS — E559 Vitamin D deficiency, unspecified: Secondary | ICD-10-CM | POA: Diagnosis not present

## 2023-04-22 DIAGNOSIS — Z6833 Body mass index (BMI) 33.0-33.9, adult: Secondary | ICD-10-CM | POA: Diagnosis not present

## 2023-04-22 DIAGNOSIS — E611 Iron deficiency: Secondary | ICD-10-CM | POA: Diagnosis not present

## 2023-04-22 DIAGNOSIS — M4316 Spondylolisthesis, lumbar region: Secondary | ICD-10-CM | POA: Diagnosis not present

## 2023-04-22 DIAGNOSIS — M67911 Unspecified disorder of synovium and tendon, right shoulder: Secondary | ICD-10-CM | POA: Diagnosis not present

## 2023-04-26 DIAGNOSIS — M545 Low back pain, unspecified: Secondary | ICD-10-CM | POA: Diagnosis not present

## 2023-04-26 DIAGNOSIS — M25511 Pain in right shoulder: Secondary | ICD-10-CM | POA: Diagnosis not present

## 2023-04-26 DIAGNOSIS — M6281 Muscle weakness (generalized): Secondary | ICD-10-CM | POA: Diagnosis not present

## 2023-04-29 DIAGNOSIS — M6281 Muscle weakness (generalized): Secondary | ICD-10-CM | POA: Diagnosis not present

## 2023-04-29 DIAGNOSIS — M545 Low back pain, unspecified: Secondary | ICD-10-CM | POA: Diagnosis not present

## 2023-04-29 DIAGNOSIS — M25511 Pain in right shoulder: Secondary | ICD-10-CM | POA: Diagnosis not present

## 2023-05-03 DIAGNOSIS — M25511 Pain in right shoulder: Secondary | ICD-10-CM | POA: Diagnosis not present

## 2023-05-03 DIAGNOSIS — M6281 Muscle weakness (generalized): Secondary | ICD-10-CM | POA: Diagnosis not present

## 2023-05-03 DIAGNOSIS — M545 Low back pain, unspecified: Secondary | ICD-10-CM | POA: Diagnosis not present

## 2023-05-06 ENCOUNTER — Encounter: Payer: Self-pay | Admitting: Internal Medicine

## 2023-05-06 DIAGNOSIS — M25511 Pain in right shoulder: Secondary | ICD-10-CM | POA: Diagnosis not present

## 2023-05-06 DIAGNOSIS — M1712 Unilateral primary osteoarthritis, left knee: Secondary | ICD-10-CM | POA: Diagnosis not present

## 2023-05-06 DIAGNOSIS — M6281 Muscle weakness (generalized): Secondary | ICD-10-CM | POA: Diagnosis not present

## 2023-05-06 DIAGNOSIS — M545 Low back pain, unspecified: Secondary | ICD-10-CM | POA: Diagnosis not present

## 2023-05-07 DIAGNOSIS — E7800 Pure hypercholesterolemia, unspecified: Secondary | ICD-10-CM | POA: Diagnosis not present

## 2023-05-07 DIAGNOSIS — Z6832 Body mass index (BMI) 32.0-32.9, adult: Secondary | ICD-10-CM | POA: Diagnosis not present

## 2023-05-10 ENCOUNTER — Other Ambulatory Visit: Payer: Self-pay | Admitting: Neurological Surgery

## 2023-05-10 DIAGNOSIS — M545 Low back pain, unspecified: Secondary | ICD-10-CM | POA: Diagnosis not present

## 2023-05-10 DIAGNOSIS — M6281 Muscle weakness (generalized): Secondary | ICD-10-CM | POA: Diagnosis not present

## 2023-05-10 DIAGNOSIS — M25511 Pain in right shoulder: Secondary | ICD-10-CM | POA: Diagnosis not present

## 2023-05-11 DIAGNOSIS — G4733 Obstructive sleep apnea (adult) (pediatric): Secondary | ICD-10-CM | POA: Diagnosis not present

## 2023-05-13 DIAGNOSIS — M545 Low back pain, unspecified: Secondary | ICD-10-CM | POA: Diagnosis not present

## 2023-05-13 DIAGNOSIS — M6281 Muscle weakness (generalized): Secondary | ICD-10-CM | POA: Diagnosis not present

## 2023-05-13 DIAGNOSIS — M25511 Pain in right shoulder: Secondary | ICD-10-CM | POA: Diagnosis not present

## 2023-05-17 DIAGNOSIS — M6281 Muscle weakness (generalized): Secondary | ICD-10-CM | POA: Diagnosis not present

## 2023-05-17 DIAGNOSIS — M25511 Pain in right shoulder: Secondary | ICD-10-CM | POA: Diagnosis not present

## 2023-05-17 DIAGNOSIS — M545 Low back pain, unspecified: Secondary | ICD-10-CM | POA: Diagnosis not present

## 2023-05-20 DIAGNOSIS — M4316 Spondylolisthesis, lumbar region: Secondary | ICD-10-CM | POA: Diagnosis not present

## 2023-05-21 DIAGNOSIS — M25511 Pain in right shoulder: Secondary | ICD-10-CM | POA: Diagnosis not present

## 2023-05-21 DIAGNOSIS — M545 Low back pain, unspecified: Secondary | ICD-10-CM | POA: Diagnosis not present

## 2023-05-21 DIAGNOSIS — M6281 Muscle weakness (generalized): Secondary | ICD-10-CM | POA: Diagnosis not present

## 2023-05-25 DIAGNOSIS — M6281 Muscle weakness (generalized): Secondary | ICD-10-CM | POA: Diagnosis not present

## 2023-05-25 DIAGNOSIS — Z6832 Body mass index (BMI) 32.0-32.9, adult: Secondary | ICD-10-CM | POA: Diagnosis not present

## 2023-05-25 DIAGNOSIS — E7800 Pure hypercholesterolemia, unspecified: Secondary | ICD-10-CM | POA: Diagnosis not present

## 2023-05-25 DIAGNOSIS — M545 Low back pain, unspecified: Secondary | ICD-10-CM | POA: Diagnosis not present

## 2023-05-25 DIAGNOSIS — M25511 Pain in right shoulder: Secondary | ICD-10-CM | POA: Diagnosis not present

## 2023-05-26 ENCOUNTER — Ambulatory Visit: Payer: BC Managed Care – PPO | Admitting: Podiatry

## 2023-05-26 ENCOUNTER — Encounter: Payer: Self-pay | Admitting: Podiatry

## 2023-05-26 VITALS — Ht 68.0 in | Wt 255.1 lb

## 2023-05-26 DIAGNOSIS — L600 Ingrowing nail: Secondary | ICD-10-CM

## 2023-05-26 NOTE — Progress Notes (Signed)
Subjective:   Patient ID: [name], female   DOB: 51 y.o.   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

## 2023-05-28 DIAGNOSIS — M6281 Muscle weakness (generalized): Secondary | ICD-10-CM | POA: Diagnosis not present

## 2023-05-28 DIAGNOSIS — M25511 Pain in right shoulder: Secondary | ICD-10-CM | POA: Diagnosis not present

## 2023-05-28 DIAGNOSIS — M545 Low back pain, unspecified: Secondary | ICD-10-CM | POA: Diagnosis not present

## 2023-05-31 ENCOUNTER — Other Ambulatory Visit: Payer: Self-pay | Admitting: Podiatry

## 2023-05-31 ENCOUNTER — Telehealth: Payer: Self-pay

## 2023-05-31 ENCOUNTER — Telehealth: Payer: Self-pay | Admitting: Podiatry

## 2023-05-31 DIAGNOSIS — M6281 Muscle weakness (generalized): Secondary | ICD-10-CM | POA: Diagnosis not present

## 2023-05-31 DIAGNOSIS — L03032 Cellulitis of left toe: Secondary | ICD-10-CM | POA: Diagnosis not present

## 2023-05-31 DIAGNOSIS — M25511 Pain in right shoulder: Secondary | ICD-10-CM | POA: Diagnosis not present

## 2023-05-31 DIAGNOSIS — D225 Melanocytic nevi of trunk: Secondary | ICD-10-CM | POA: Diagnosis not present

## 2023-05-31 DIAGNOSIS — M545 Low back pain, unspecified: Secondary | ICD-10-CM | POA: Diagnosis not present

## 2023-05-31 DIAGNOSIS — L821 Other seborrheic keratosis: Secondary | ICD-10-CM | POA: Diagnosis not present

## 2023-05-31 DIAGNOSIS — L814 Other melanin hyperpigmentation: Secondary | ICD-10-CM | POA: Diagnosis not present

## 2023-05-31 MED ORDER — DOXYCYCLINE HYCLATE 100 MG PO TABS: 100.0000 mg | ORAL_TABLET | Freq: Two times a day (BID) | ORAL | 0 refills | Status: AC

## 2023-05-31 NOTE — Telephone Encounter (Signed)
Pt called had partial nail removal on 11/27 and it is red, swollen and hot to touch. Would like antibiotic sent in. Pharmacy is correct in chart. She stated she needed it over the weekend but did not call I told her we do have a doctor on call if she ever needed anything over the weekends

## 2023-05-31 NOTE — Telephone Encounter (Signed)
Left message for pt medication was sent in.

## 2023-05-31 NOTE — Telephone Encounter (Signed)
Called her in doxy

## 2023-05-31 NOTE — Telephone Encounter (Signed)
Patient called and left a message - She has ingrown procedure last Wednesday. Her toe is red and very swollen. She thinks this is more than the normal chemical reaction to Phenol. Concerned for infection. Asking for antibiotics. Please advise Thanks

## 2023-06-03 DIAGNOSIS — E039 Hypothyroidism, unspecified: Secondary | ICD-10-CM | POA: Diagnosis not present

## 2023-06-03 DIAGNOSIS — I10 Essential (primary) hypertension: Secondary | ICD-10-CM | POA: Diagnosis not present

## 2023-06-03 DIAGNOSIS — N951 Menopausal and female climacteric states: Secondary | ICD-10-CM | POA: Diagnosis not present

## 2023-06-03 DIAGNOSIS — N3941 Urge incontinence: Secondary | ICD-10-CM | POA: Diagnosis not present

## 2023-06-04 DIAGNOSIS — M545 Low back pain, unspecified: Secondary | ICD-10-CM | POA: Diagnosis not present

## 2023-06-04 DIAGNOSIS — M6281 Muscle weakness (generalized): Secondary | ICD-10-CM | POA: Diagnosis not present

## 2023-06-04 DIAGNOSIS — M25511 Pain in right shoulder: Secondary | ICD-10-CM | POA: Diagnosis not present

## 2023-06-07 ENCOUNTER — Telehealth: Payer: Self-pay | Admitting: Podiatry

## 2023-06-07 NOTE — Telephone Encounter (Signed)
Called her

## 2023-06-07 NOTE — Telephone Encounter (Signed)
Called pt to get her scheduled and she stated she does not want to schedule an appt. She wants you to call her directly, she has a question. She lives over 30 minutes away.

## 2023-06-07 NOTE — Telephone Encounter (Signed)
Patient called requesting to speak with you (Dr.Regal) directly. She does not want to chat with the nurse or leave a message. Patient stated that you removed an ingrown toenail and that it is infected. She stated she has ran out of medications as well.  Thank!

## 2023-06-07 NOTE — Telephone Encounter (Signed)
Have her come in this afternoon for me to check. I'm in surgery lunch

## 2023-06-08 DIAGNOSIS — M25511 Pain in right shoulder: Secondary | ICD-10-CM | POA: Diagnosis not present

## 2023-06-08 DIAGNOSIS — M545 Low back pain, unspecified: Secondary | ICD-10-CM | POA: Diagnosis not present

## 2023-06-08 DIAGNOSIS — M6281 Muscle weakness (generalized): Secondary | ICD-10-CM | POA: Diagnosis not present

## 2023-06-09 DIAGNOSIS — R42 Dizziness and giddiness: Secondary | ICD-10-CM | POA: Diagnosis not present

## 2023-06-09 DIAGNOSIS — M545 Low back pain, unspecified: Secondary | ICD-10-CM | POA: Diagnosis not present

## 2023-06-09 DIAGNOSIS — M25511 Pain in right shoulder: Secondary | ICD-10-CM | POA: Diagnosis not present

## 2023-06-09 DIAGNOSIS — J069 Acute upper respiratory infection, unspecified: Secondary | ICD-10-CM | POA: Diagnosis not present

## 2023-06-09 DIAGNOSIS — M6281 Muscle weakness (generalized): Secondary | ICD-10-CM | POA: Diagnosis not present

## 2023-06-11 NOTE — Pre-Procedure Instructions (Signed)
Surgical Instructions   Your procedure is scheduled on June 18, 2023. Report to [facility] Main Entrance "A" at 8:15 A.M., then check in with the Admitting office. Any questions or running late day of surgery: call [phone]  Questions prior to your surgery date: call [phone], Monday-Friday, 8am-4pm. If you experience any cold or flu symptoms such as cough, fever, chills, shortness of breath, etc. between now and your scheduled surgery, please notify us at the above number.     Remember:  Do not eat or drink after midnight the night before your surgery    Take these medicines the morning of surgery with A SIP OF WATER: amoxicillin-clavulanate (AUGMENTIN)  doxycycline (VIBRA-TABS)  DULoxetine (CYMBALTA)  levothyroxine (SYNTHROID)  liothyronine (CYTOMEL)  Oxycodone  pregabalin (LYRICA)  REXULTI  VIIBRYD    May take these medicines IF NEEDED: clonazePAM (KLONOPIN)  famotidine (PEPCID)  fluticasone (FLONASE) nasal spray  HYDROmorphone (DILAUDID)  ondansetron (ZOFRAN-ODT)  tiZANidine (ZANAFLEX)  valACYclovir (VALTREX)    Follow your surgeon's instructions on when to stop Aspirin.  If no instructions were given by your surgeon then you will need to call the office to get those instructions.     One week prior to surgery, STOP taking any Aleve, Naproxen, Ibuprofen, Motrin, Advil, Goody's, BC's, all herbal medications, fish oil, and non-prescription vitamins.                     Do NOT Smoke (Tobacco/Vaping) for 24 hours prior to your procedure.  If you use a CPAP at night, you may bring your mask/headgear for your overnight stay.   You will be asked to remove any contacts, glasses, piercing's, hearing aid's, dentures/partials prior to surgery. Please bring cases for these items if needed.    Patients discharged the day of surgery will not be allowed to drive home, and someone needs to stay with them for 24 hours.  SURGICAL WAITING ROOM VISITATION Patients may  have no more than 2 support people in the waiting area - these visitors may rotate.   Pre-op nurse will coordinate an appropriate time for 1 ADULT support person, who may not rotate, to accompany patient in pre-op.  Children under the age of 50 must have an adult with them who is not the patient and must remain in the main waiting area with an adult.  If the patient needs to stay at the hospital during part of their recovery, the visitor guidelines for inpatient rooms apply.  Please refer to the [facility] website for the visitor guidelines for any additional information.   If you received a COVID test during your pre-op visit  it is requested that you wear a mask when out in public, stay away from anyone that may not be feeling well and notify your surgeon if you develop symptoms. If you have been in contact with anyone that has tested positive in the last 10 days please notify you surgeon.      Pre-operative 5 CHG Bathing Instructions   You can play a key role in reducing the risk of infection after surgery. Your skin needs to be as free of germs as possible. You can reduce the number of germs on your skin by washing with CHG (chlorhexidine gluconate) soap before surgery. CHG is an antiseptic soap that kills germs and continues to kill germs even after washing.   DO NOT use if you have an allergy to chlorhexidine/CHG or antibacterial soaps. If your skin becomes reddened or irritated, stop  using the CHG and notify one of our RNs at [phone].   Please shower with the CHG soap starting 4 days before surgery using the following schedule:     Please keep in mind the following:  DO NOT shave, including legs and underarms, starting the day of your first shower.   You may shave your face at any point before/day of surgery.  Place clean sheets on your bed the day you start using CHG soap. Use a clean washcloth (not used since being washed) for each shower. DO NOT sleep with pets once you  start using the CHG.   CHG Shower Instructions:  Wash your face and private area with normal soap. If you choose to wash your hair, wash first with your normal shampoo.  After you use shampoo/soap, rinse your hair and body thoroughly to remove shampoo/soap residue.  Turn the water OFF and apply about 3 tablespoons (45 ml) of CHG soap to a CLEAN washcloth.  Apply CHG soap ONLY FROM YOUR NECK DOWN TO YOUR TOES (washing for 3-5 minutes)  DO NOT use CHG soap on face, private areas, open wounds, or sores.  Pay special attention to the area where your surgery is being performed.  If you are having back surgery, having someone wash your back for you may be helpful. Wait 2 minutes after CHG soap is applied, then you may rinse off the CHG soap.  Pat dry with a clean towel  Put on clean clothes/pajamas   If you choose to wear lotion, please use ONLY the CHG-compatible lotions on the back of this paper.   Additional instructions for the day of surgery: DO NOT APPLY any lotions, deodorants, cologne, or perfumes.   Do not bring valuables to the hospital. [facility] is not responsible for any belongings/valuables. Do not wear nail polish, gel polish, artificial nails, or any other type of covering on natural nails (fingers and toes) Do not wear jewelry or makeup Put on clean/comfortable clothes.  Please brush your teeth.  Ask your nurse before applying any prescription medications to the skin.     CHG Compatible Lotions   Aveeno Moisturizing lotion  Cetaphil Moisturizing Cream  Cetaphil Moisturizing Lotion  Clairol Herbal Essence Moisturizing Lotion, Dry Skin  Clairol Herbal Essence Moisturizing Lotion, Extra Dry Skin  Clairol Herbal Essence Moisturizing Lotion, Normal Skin  Curel Age Defying Therapeutic Moisturizing Lotion with Alpha Hydroxy  Curel Extreme Care Body Lotion  Curel Soothing Hands Moisturizing Hand Lotion  Curel Therapeutic Moisturizing Cream, Fragrance-Free  Curel  Therapeutic Moisturizing Lotion, Fragrance-Free  Curel Therapeutic Moisturizing Lotion, Original Formula  Eucerin Daily Replenishing Lotion  Eucerin Dry Skin Therapy Plus Alpha Hydroxy Crme  Eucerin Dry Skin Therapy Plus Alpha Hydroxy Lotion  Eucerin Original Crme  Eucerin Original Lotion  Eucerin Plus Crme Eucerin Plus Lotion  Eucerin TriLipid Replenishing Lotion  Keri Anti-Bacterial Hand Lotion  Keri Deep Conditioning Original Lotion Dry Skin Formula Softly Scented  Keri Deep Conditioning Original Lotion, Fragrance Free Sensitive Skin Formula  Keri Lotion Fast Absorbing Fragrance Free Sensitive Skin Formula  Keri Lotion Fast Absorbing Softly Scented Dry Skin Formula  Keri Original Lotion  Keri Skin Renewal Lotion Keri Silky Smooth Lotion  Keri Silky Smooth Sensitive Skin Lotion  Nivea Body Creamy Conditioning Oil  Nivea Body Extra Enriched Teacher, adult education Moisturizing Lotion Nivea Crme  Nivea Skin Firming Lotion  NutraDerm 30 Skin Lotion  NutraDerm Skin Lotion  NutraDerm Therapeutic Skin Cream  NutraDerm Therapeutic Skin Lotion  ProShield Protective Hand Cream  Provon moisturizing lotion  Please read over the following fact sheets that you were given.

## 2023-06-14 ENCOUNTER — Inpatient Hospital Stay (HOSPITAL_COMMUNITY)
Admission: RE | Admit: 2023-06-14 | Discharge: 2023-06-14 | Disposition: A | Discharge status: Home / Self Care | Payer: BC Managed Care – PPO | Source: Ambulatory Visit

## 2023-06-14 ENCOUNTER — Other Ambulatory Visit: Payer: BC Managed Care – PPO

## 2023-06-14 DIAGNOSIS — R42 Dizziness and giddiness: Secondary | ICD-10-CM | POA: Diagnosis not present

## 2023-06-14 DIAGNOSIS — M25511 Pain in right shoulder: Secondary | ICD-10-CM | POA: Diagnosis not present

## 2023-06-14 DIAGNOSIS — M6281 Muscle weakness (generalized): Secondary | ICD-10-CM | POA: Diagnosis not present

## 2023-06-14 DIAGNOSIS — M545 Low back pain, unspecified: Secondary | ICD-10-CM | POA: Diagnosis not present

## 2023-06-15 ENCOUNTER — Inpatient Hospital Stay (HOSPITAL_COMMUNITY)
Admission: RE | Admit: 2023-06-15 | Discharge: 2023-06-15 | Discharge status: Home / Self Care | Payer: BC Managed Care – PPO | Source: Ambulatory Visit | Attending: Neurological Surgery

## 2023-06-15 ENCOUNTER — Encounter (HOSPITAL_COMMUNITY): Payer: Self-pay

## 2023-06-15 ENCOUNTER — Other Ambulatory Visit: Payer: BC Managed Care – PPO

## 2023-06-15 ENCOUNTER — Other Ambulatory Visit: Payer: Self-pay

## 2023-06-15 VITALS — BP 129/79 | HR 95 | Temp 98.2°F | Resp 17 | Ht 68.0 in | Wt 209.0 lb

## 2023-06-15 DIAGNOSIS — M4316 Spondylolisthesis, lumbar region: Secondary | ICD-10-CM | POA: Insufficient documentation

## 2023-06-15 DIAGNOSIS — I10 Essential (primary) hypertension: Secondary | ICD-10-CM | POA: Insufficient documentation

## 2023-06-15 DIAGNOSIS — K219 Gastro-esophageal reflux disease without esophagitis: Secondary | ICD-10-CM | POA: Insufficient documentation

## 2023-06-15 DIAGNOSIS — G4733 Obstructive sleep apnea (adult) (pediatric): Secondary | ICD-10-CM | POA: Diagnosis not present

## 2023-06-15 DIAGNOSIS — M069 Rheumatoid arthritis, unspecified: Secondary | ICD-10-CM | POA: Insufficient documentation

## 2023-06-15 DIAGNOSIS — Z01818 Encounter for other preprocedural examination: Secondary | ICD-10-CM

## 2023-06-15 DIAGNOSIS — Z01812 Encounter for preprocedural laboratory examination: Secondary | ICD-10-CM | POA: Insufficient documentation

## 2023-06-15 DIAGNOSIS — M797 Fibromyalgia: Secondary | ICD-10-CM | POA: Diagnosis not present

## 2023-06-15 DIAGNOSIS — D72829 Elevated white blood cell count, unspecified: Secondary | ICD-10-CM

## 2023-06-15 LAB — BASIC METABOLIC PANEL
Anion gap: 6 (ref 5–15)
BUN: 15 mg/dL (ref 6–20)
CO2: 24 mmol/L (ref 22–32)
Calcium: 9.4 mg/dL (ref 8.9–10.3)
Chloride: 107 mmol/L (ref 98–111)
Creatinine, Ser: 0.92 mg/dL (ref 0.44–1.00)
GFR, Estimated: >60 mL/min (ref >60)
Glucose, Bld: 96 mg/dL (ref 70–99)
Potassium: 4.3 mmol/L (ref 3.5–5.1)
Sodium: 137 mmol/L (ref 135–145)

## 2023-06-15 LAB — CBC
HCT: 46.1 % — ABNORMAL HIGH (ref 36.0–46.0)
Hemoglobin: 15.1 g/dL — ABNORMAL HIGH (ref 12.0–15.0)
MCH: 29.3 pg (ref 26.0–34.0)
MCHC: 32.8 g/dL (ref 30.0–36.0)
MCV: 89.5 fL (ref 80.0–100.0)
Platelets: 264 10*3/uL (ref 150–400)
RBC: 5.15 MIL/uL — ABNORMAL HIGH (ref 3.87–5.11)
RDW: 12.9 % (ref 11.5–15.5)
WBC: 14.9 10*3/uL — ABNORMAL HIGH (ref 4.0–10.5)
nRBC: 0 % (ref 0.0–0.2)

## 2023-06-15 LAB — TYPE AND SCREEN
ABO/RH(D): O NEG
Antibody Screen: NEGATIVE

## 2023-06-15 LAB — SURGICAL PCR SCREEN
MRSA, PCR: NEGATIVE
Staphylococcus aureus: NEGATIVE

## 2023-06-15 LAB — PROTIME-INR
INR: 1.1 (ref 0.8–1.2)
Prothrombin Time: 14.1 s (ref 11.4–15.2)

## 2023-06-15 NOTE — Progress Notes (Signed)
PCP - Dr. Donovan Kail Cardiologist - Dr. Cathlean Cower     -requested EKG tracing from 04/05/23  PPM/ICD - denies Device Orders - n/a Rep Notified - n/a  Chest x-ray - denies EKG - 04/05/23 - requested Stress Test - 11/28/18 - [facility] ECHO - 10/13/22 - [facility] Cardiac Cath - denies  Sleep Study -OSA+ CPAP - wears nightly  Fasting Blood Sugar - n/a   Last dose of GLP1 agonist-  last dose of Zepbound was 06/08/23   Blood Thinner Instructions: n/a Aspirin Instructions: patient states that her last dose of Aspirin was 06/15/23  ERAS Protcol - NPO PRE-SURGERY Ensure or G2- n/a  COVID TEST- n/a   Anesthesia review: yes  Patient denies shortness of breath, fever, cough and chest pain at PAT appointment   All instructions explained to the patient, with a verbal understanding of the material. Patient agrees to go over the instructions while at home for a better understanding. Patient also instructed to self quarantine after being tested for COVID-19. The opportunity to ask questions was provided.

## 2023-06-15 NOTE — Progress Notes (Signed)
Surgical Instructions   Your procedure is scheduled on Friday, December 20th, 2024. Report to [facility] Main Entrance "A" at 8:15 A.M., then check in with the Admitting office. Any questions or running late day of surgery: call [phone]  Questions prior to your surgery date: call [phone], Monday-Friday, 8am-4pm. If you experience any cold or flu symptoms such as cough, fever, chills, shortness of breath, etc. between now and your scheduled surgery, please notify us at the above number.     Remember:  Do not eat or drink after midnight the night before your surgery   Take these medicines the morning of surgery with A SIP OF WATER: Clonazepam (Klonopin) Dextromethorphan-bupropion (Auvelity) Doxycycline (Vibra-Tabs) Levothyroxine (Synthroid) Liothyronine (Cytomel) Oxycodone HCL Pregabalin (Lyrica) Tizanidine (Zanaflex) Viibryd Vraylar   May take these medicines IF NEEDED: Benzonatate (Tessalon) Famotidine (Pepcid) Methocarbamol (Robaxin)     One week prior to surgery, STOP taking any Aspirin (unless otherwise instructed by your surgeon) Aleve, Naproxen, Ibuprofen, Motrin, Advil, Goody's, BC's, all herbal medications, fish oil, and non-prescription vitamins.                     Do NOT Smoke (Tobacco/Vaping) for 24 hours prior to your procedure.  If you use a CPAP at night, you may bring your mask/headgear for your overnight stay.   You will be asked to remove any contacts, glasses, piercing's, hearing aid's, dentures/partials prior to surgery. Please bring cases for these items if needed.    Patients discharged the day of surgery will not be allowed to drive home, and someone needs to stay with them for 24 hours.  SURGICAL WAITING ROOM VISITATION Patients may have no more than 2 support people in the waiting area - these visitors may rotate.   Pre-op nurse will coordinate an appropriate time for 1 ADULT support person, who may not rotate, to accompany patient in  pre-op.  Children under the age of 36 must have an adult with them who is not the patient and must remain in the main waiting area with an adult.  If the patient needs to stay at the hospital during part of their recovery, the visitor guidelines for inpatient rooms apply.  Please refer to the [facility] website for the visitor guidelines for any additional information.   If you received a COVID test during your pre-op visit  it is requested that you wear a mask when out in public, stay away from anyone that may not be feeling well and notify your surgeon if you develop symptoms. If you have been in contact with anyone that has tested positive in the last 10 days please notify you surgeon.      Pre-operative 5 CHG Bathing Instructions   You can play a key role in reducing the risk of infection after surgery. Your skin needs to be as free of germs as possible. You can reduce the number of germs on your skin by washing with CHG (chlorhexidine gluconate) soap before surgery. CHG is an antiseptic soap that kills germs and continues to kill germs even after washing.   DO NOT use if you have an allergy to chlorhexidine/CHG or antibacterial soaps. If your skin becomes reddened or irritated, stop using the CHG and notify one of our RNs at [phone].   Please shower with the CHG soap starting 4 days before surgery using the following schedule:     Please keep in mind the following:  DO NOT shave, including legs and underarms, starting the  day of your first shower.   You may shave your face at any point before/day of surgery.  Place clean sheets on your bed the day you start using CHG soap. Use a clean washcloth (not used since being washed) for each shower. DO NOT sleep with pets once you start using the CHG.   CHG Shower Instructions:  Wash your face and private area with normal soap. If you choose to wash your hair, wash first with your normal shampoo.  After you use shampoo/soap, rinse  your hair and body thoroughly to remove shampoo/soap residue.  Turn the water OFF and apply about 3 tablespoons (45 ml) of CHG soap to a CLEAN washcloth.  Apply CHG soap ONLY FROM YOUR NECK DOWN TO YOUR TOES (washing for 3-5 minutes)  DO NOT use CHG soap on face, private areas, open wounds, or sores.  Pay special attention to the area where your surgery is being performed.  If you are having back surgery, having someone wash your back for you may be helpful. Wait 2 minutes after CHG soap is applied, then you may rinse off the CHG soap.  Pat dry with a clean towel  Put on clean clothes/pajamas   If you choose to wear lotion, please use ONLY the CHG-compatible lotions on the back of this paper.   Additional instructions for the day of surgery: DO NOT APPLY any lotions, deodorants, cologne, or perfumes.   Do not bring valuables to the hospital. [facility] is not responsible for any belongings/valuables. Do not wear nail polish, gel polish, artificial nails, or any other type of covering on natural nails (fingers and toes) Do not wear jewelry or makeup Put on clean/comfortable clothes.  Please brush your teeth.  Ask your nurse before applying any prescription medications to the skin.     CHG Compatible Lotions   Aveeno Moisturizing lotion  Cetaphil Moisturizing Cream  Cetaphil Moisturizing Lotion  Clairol Herbal Essence Moisturizing Lotion, Dry Skin  Clairol Herbal Essence Moisturizing Lotion, Extra Dry Skin  Clairol Herbal Essence Moisturizing Lotion, Normal Skin  Curel Age Defying Therapeutic Moisturizing Lotion with Alpha Hydroxy  Curel Extreme Care Body Lotion  Curel Soothing Hands Moisturizing Hand Lotion  Curel Therapeutic Moisturizing Cream, Fragrance-Free  Curel Therapeutic Moisturizing Lotion, Fragrance-Free  Curel Therapeutic Moisturizing Lotion, Original Formula  Eucerin Daily Replenishing Lotion  Eucerin Dry Skin Therapy Plus Alpha Hydroxy Crme  Eucerin Dry Skin  Therapy Plus Alpha Hydroxy Lotion  Eucerin Original Crme  Eucerin Original Lotion  Eucerin Plus Crme Eucerin Plus Lotion  Eucerin TriLipid Replenishing Lotion  Keri Anti-Bacterial Hand Lotion  Keri Deep Conditioning Original Lotion Dry Skin Formula Softly Scented  Keri Deep Conditioning Original Lotion, Fragrance Free Sensitive Skin Formula  Keri Lotion Fast Absorbing Fragrance Free Sensitive Skin Formula  Keri Lotion Fast Absorbing Softly Scented Dry Skin Formula  Keri Original Lotion  Keri Skin Renewal Lotion Keri Silky Smooth Lotion  Keri Silky Smooth Sensitive Skin Lotion  Nivea Body Creamy Conditioning Oil  Nivea Body Extra Enriched Lotion  Nivea Body Original Lotion  Nivea Body Sheer Moisturizing Lotion Nivea Crme  Nivea Skin Firming Lotion  NutraDerm 30 Skin Lotion  NutraDerm Skin Lotion  NutraDerm Therapeutic Skin Cream  NutraDerm Therapeutic Skin Lotion  ProShield Protective Hand Cream  Provon moisturizing lotion  Please read over the following fact sheets that you were given.

## 2023-06-16 NOTE — Anesthesia Preprocedure Evaluation (Addendum)
Anesthesia Evaluation  Patient identified by MRN, date of birth, ID band Patient awake    Reviewed: Allergy & Precautions, NPO status , Patient's Chart, lab work & pertinent test results  History of Anesthesia Complications (+) PONV and history of anesthetic complications  Airway Mallampati: II  TM Distance: >3 FB Neck ROM: Full    Dental  (+) Dental Advisory Given   Pulmonary sleep apnea , Current Smoker   breath sounds clear to auscultation       Cardiovascular hypertension, Pt. on medications + dysrhythmias  Rhythm:Regular Rate:Normal     Neuro/Psych  Neuromuscular disease    GI/Hepatic Neg liver ROS,GERD  ,,  Endo/Other  Hypothyroidism    Renal/GU negative Renal ROS     Musculoskeletal  (+) Arthritis ,  Fibromyalgia -  Abdominal   Peds  Hematology negative hematology ROS (+)   Anesthesia Other Findings   Reproductive/Obstetrics                             Anesthesia Physical Anesthesia Plan  ASA: 3  Anesthesia Plan: General   Post-op Pain Management: Tylenol PO (pre-op)*, Ketamine IV* and Dilaudid IV   Induction: Intravenous  PONV Risk Score and Plan: 3 and Dexamethasone, Ondansetron, Midazolam and Treatment may vary due to age or medical condition  Airway Management Planned: Oral ETT  Additional Equipment:   Intra-op Plan:   Post-operative Plan: Extubation in OR  Informed Consent: I have reviewed the patients History and Physical, chart, labs and discussed the procedure including the risks, benefits and alternatives for the proposed anesthesia with the patient or authorized representative who has indicated his/her understanding and acceptance.     Dental advisory given  Plan Discussed with: CRNA  Anesthesia Plan Comments: (PAT note written 06/16/2023 by Shonna Chock, PA-C.  )       Anesthesia Quick Evaluation

## 2023-06-16 NOTE — Progress Notes (Signed)
Anesthesia Chart Review:  Case: [id number] Date/Time: 06/18/23 1000   Procedure: PLIF - L3-L4 - Posterior Lateral and Interbody fusion, with extension of hardware (Back)   Anesthesia type: General   Pre-op diagnosis: Spondylolisthesis   [facility] ROOM 20 / [facility]   Surgeons: Arman Bogus, MD       DISCUSSION: Patient is a 53 female scheduled for the above procedure.  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    Last cardiology visit was on 04/05/23 with Jodell Cipro, FNP for follow-up WPW, frequent PVCs, palpitations. EKG showed SR. No CV symptoms. One year follow-up planned. Last echo 10/13/22 showed mild concentric LVH, LVEF 55-60%, no significant valvular disease.   On 05/31/23, she was prescribed doxycycline for erythema following left ingrown toenail removal on 05/16/23. She declined follow-up DPM visit, but did have a phone conversation with Dr. Charlsie Merles on 06/07/23.   WBC 14.9. As above, > 2 weeks ago she was started on doxycycline for left toe erythema. She denied shortness of breath, fever, cough and chest pain at PAT appointment. I notified Erie Noe at Dr. Yetta Barre' office to review with Dr. Yetta Barre for additional recommendations, if any.   Last dose of Zepbound was on 06/08/23.  Last aspirin reported as 06/15/2023.  Anesthesia team to evaluate on the day of surgery. (UPDATE 06/17/23 1:03 PM: Dr. Yetta Barre requested that CBC be repeated on arrival for surgery to re-evaluate leukocytosis.)   VS: BP 129/79   Pulse 95   Temp 36.8 C    Resp 17   Ht 5\' 8"  (1.727 m)   Wt 94.8 kg   LMP  (LMP Unknown)   SpO2 96%   BMI 31.78 kg/m   PROVIDERS: Daisy Floro, MD is PCP  Cathlean Cower, MD is cardiologist  Zenovia Jordan, MD is rheumatologist   LABS: Preoperative labs noted. WBC 14.9.  (all labs ordered are listed, but only abnormal results are displayed)  Labs Reviewed  CBC - Abnormal; Notable for the following components:      Result Value   WBC 14.9 (*)    RBC 5.15 (*)    Hemoglobin 15.1 (*)    HCT 46.1 (*)    All other components within normal limits  SURGICAL PCR SCREEN  BASIC METABOLIC PANEL  PROTIME-INR  TYPE AND SCREEN    IMAGES: MRI L-spine 10/01/22: IMPRESSION: 1. At L3-4 there is a broad-based disc bulge. Moderate bilateral facet arthropathy. Moderate spinal stenosis. No foraminal stenosis. 2. At L5-S1 there is interbody fusion with posterior osseous ridging. Moderate left and mild right foraminal stenosis. No spinal stenosis. Prominence of the epidural fat deforming the thecal sac as can be seen with epidural lipomatosis. 3. Posterior lumbar interbody fusion at L4-5 without foraminal or central canal stenosis. 4. No acute osseous injury of the lumbar spine.    EKG: 04/05/23 ([facility]): Tracing requested. Per Narrative in Care Everywhere: Sinus rhythm  Rightward axis  Incomplete right bundle branch block  Confirmed by Heide Scales  [id number]  on 04-05-2023 4 09 24 PM    CV: Echo 10/13/22 ([facility]): SUMMARY  There is mild concentric left ventricular hypertrophy.  Left ventricular systolic function is normal.  LV ejection fraction = 55-60%.  There is no significant valvular stenosis or regurgitation.  There is no pericardial effusion.  There is no comparison study available.    Up to 30 days Cardiac event monitor 12/26/11 ([facility]): Baseline Rhythm  Sinus tachycardia  Rhythm Findings  1.  Ventricular  None  2.  Supraventricular  None  3.  Bradyarrhythmias and Pauses  None, the  lowest heart rate recorded was 80 bpm at 2 25 PM on 29 June.  The baseline heart rate was 105 bpm sinus tachycardia there was only 1 stable events that occurred which was automatically detected and this was sinus tachycardia.  Symptoms reported  Not reported  CONCLUSIONS  1.  slightly abnormal event monitor due to sustained heart rates for most of the monitoring.  2.  arrhythmia  Not present  3.  Symptoms were not reported  4.  Symptoms were not correlated with arrhythmias    Stress echo 11/28/18 ([facility]): Conclusions Summary Normal Stress Echocardiogram with no ischemia suggested. LVEF >50%. Functional capacity is below average for age/sex -5 mets on the 2-min Bruce Protocol  Past Medical History:  Diagnosis Date   ADD (attention deficit disorder)    Anxiety    Breast mass 02/2014   right   Cervical pseudoarthrosis (HCC)    Chronic back pain    Chronic constipation    Complication of anesthesia    after 07/01/2017 cervical c5-t2, pt states she had hallucination x 2 wks after surgery   Depression    Fibromyalgia    GERD (gastroesophageal reflux disease)    H/O cardiac radiofrequency ablation 1996   History of kidney stones    one lodged in right kidney - 3mm    HSV infection    Hypertension    no longer on medications   Hyperthyroidism    at on time - now hypothyroidism   Hypothyroidism    Neuromuscular disorder (HCC)    sciatica    PONV (postoperative nausea and vomiting)    Rheumatoid arthritis (HCC)    OA- lumbar & cervical     Sleep apnea    CPAP used nightly   Wolff-Parkinson-White (WPW) syndrome    Wolff-Parkinson-White pattern 1994   had ablation surgery    Past Surgical History:  Procedure Laterality Date   ABDOMINAL HYSTERECTOMY     BACK SURGERY     x 5 lumbar, 3 cervical   BUNIONECTOMY Bilateral    x4 (2 on each foot)   CARDIAC ELECTROPHYSIOLOGY MAPPING AND ABLATION     WPW   CERVICAL SPINE SURGERY     fusions x 3   COLONOSCOPY  04/06/2013    CYSTOSCOPY N/A 04/06/2018   Procedure: CYSTOSCOPY;  Surgeon: Huel Cote, MD;  Location: [facility];  Service: Gynecology;  Laterality: N/A;   ESOPHAGOGASTRODUODENOSCOPY  04/06/2013   LAPAROSCOPY  04/06/2018   Procedure: LAPAROSCOPY DIAGNOSTIC;  Surgeon: Huel Cote, MD;  Location: [facility];  Service: Gynecology;;   LUMBAR LAMINECTOMY/DECOMPRESSION MICRODISCECTOMY Right 09/02/2016   Procedure: RIGHT LUMBAR FIVE-SACRAL ONE REDO MICRODISCECTOMY;  Surgeon: Tia Alert, MD;  [facility];  Service: Neurosurgery;  Laterality: Right;   LUMBAR SPINE SURGERY     x 4   POSTERIOR CERVICAL FUSION/FORAMINOTOMY N/A 07/01/2017   Procedure: Posterior Cervical Fusion with lateral mass fixation Cervical Six-Seven;  Surgeon: Tia Alert, MD;  Location: [facility] OR;  Service: Neurosurgery;  Laterality: N/A;  Posterior Cervical Fusion with lateral mass fixation Cervical Six-Seven   ROTATOR CUFF REPAIR  Right 03/24/2023   SIGMOIDOSCOPY     SUPRACERVICAL ABDOMINAL HYSTERECTOMY N/A 04/06/2018   Procedure: SUPRACERVICAL HYSTERECTOMY ABDOMINAL WITH LEFT OOPHORECTOMY AND BILATERAL SALPINGECTOMY;  Surgeon: Huel Cote, MD;  Location: [facility];  Service: Gynecology;  Laterality: N/A;   THYROIDECTOMY  1996   TONSILLECTOMY     TOTAL KNEE ARTHROPLASTY Right 02/28/2021   Procedure: TOTAL KNEE ARTHROPLASTY, cortisone injection left knee;  Surgeon: Jodi Geralds, MD;  Location: [facility];  Service: Orthopedics;  Laterality: Right;   WISDOM TOOTH EXTRACTION      MEDICATIONS:  amphetamine-dextroamphetamine (ADDERALL) 30 MG tablet   aspirin EC 81 MG tablet   B Complex-C (SUPER B COMPLEX PO)   benzonatate (TESSALON) 100 MG capsule   Berberine Chloride (BERBERINE HCI) 500 MG CAPS   Biotin 14782 MCG TABS   clonazePAM (KLONOPIN) 1 MG tablet   DAYVIGO 10 MG TABS   Dextromethorphan-buPROPion ER (AUVELITY) 45-105 MG TBCR   DHEA 10 MG TABS   diclofenac (VOLTAREN) 75 MG EC tablet   doxepin (SINEQUAN) 50 MG capsule    doxycycline (VIBRA-TABS) 100 MG tablet   famotidine (PEPCID) 40 MG tablet   folic acid (FOLVITE) 800 MCG tablet   Iron Combinations (IRON COMPLEX) CAPS   levothyroxine (SYNTHROID) 112 MCG tablet   lidocaine (LIDODERM) 5 %   liothyronine (CYTOMEL) 25 MCG tablet   liothyronine (CYTOMEL) 5 MCG tablet   lithium carbonate (ESKALITH) 450 MG CR tablet   MAGNESIUM PO   Melatonin 10 MG TABS   methocarbamol (ROBAXIN) 750 MG tablet   Multiple Vitamin (MULTIVITAMIN WITH MINERALS) TABS tablet   naloxone (NARCAN) nasal spray 4 mg/0.1 mL   ondansetron (ZOFRAN-ODT) 8 MG disintegrating tablet   OVER THE COUNTER MEDICATION   Oxycodone HCl 20 MG TABS   polyethylene glycol (MIRALAX / GLYCOLAX) 17 g packet   pregabalin (LYRICA) 75 MG capsule   progesterone (PROMETRIUM) 100 MG capsule   tiZANidine (ZANAFLEX) 4 MG tablet   valACYclovir (VALTREX) 1000 MG tablet   VIIBRYD 40 MG TABS   Vitamin D-Vitamin K (VITAMIN K2-VITAMIN D3 PO)   VRAYLAR 3 MG capsule   ZEPBOUND 10 MG/0.5ML Pen   zinc gluconate 50 MG tablet   No current facility-administered medications for this encounter.    Shonna Chock, PA-C Surgical Short Stay/Anesthesiology [facility] Phone [phone] [facility] Phone [phone] 06/16/2023 3:01 PM

## 2023-06-17 DIAGNOSIS — R42 Dizziness and giddiness: Secondary | ICD-10-CM | POA: Diagnosis not present

## 2023-06-17 DIAGNOSIS — M25511 Pain in right shoulder: Secondary | ICD-10-CM | POA: Diagnosis not present

## 2023-06-17 DIAGNOSIS — M545 Low back pain, unspecified: Secondary | ICD-10-CM | POA: Diagnosis not present

## 2023-06-17 DIAGNOSIS — M6281 Muscle weakness (generalized): Secondary | ICD-10-CM | POA: Diagnosis not present

## 2023-06-18 ENCOUNTER — Encounter (HOSPITAL_COMMUNITY)
Admission: RE | Disposition: A | Discharge status: Home / Self Care | Payer: Self-pay | Source: Home / Self Care | Attending: Neurological Surgery

## 2023-06-18 ENCOUNTER — Other Ambulatory Visit: Payer: Self-pay

## 2023-06-18 ENCOUNTER — Ambulatory Visit (HOSPITAL_COMMUNITY): Payer: BC Managed Care – PPO | Admitting: Vascular Surgery

## 2023-06-18 ENCOUNTER — Observation Stay (HOSPITAL_COMMUNITY)
Admission: RE | Admit: 2023-06-18 | Discharge: 2023-06-18 | Disposition: A | Discharge status: Home / Self Care | Payer: BC Managed Care – PPO | Attending: Neurological Surgery | Admitting: Neurological Surgery

## 2023-06-18 ENCOUNTER — Encounter (HOSPITAL_COMMUNITY): Payer: Self-pay | Admitting: Neurological Surgery

## 2023-06-18 ENCOUNTER — Ambulatory Visit (HOSPITAL_COMMUNITY): Payer: BC Managed Care – PPO

## 2023-06-18 ENCOUNTER — Ambulatory Visit (HOSPITAL_COMMUNITY): Payer: BC Managed Care – PPO | Admitting: Registered Nurse

## 2023-06-18 DIAGNOSIS — Z01818 Encounter for other preprocedural examination: Secondary | ICD-10-CM

## 2023-06-18 DIAGNOSIS — Z96651 Presence of right artificial knee joint: Secondary | ICD-10-CM | POA: Diagnosis not present

## 2023-06-18 DIAGNOSIS — E039 Hypothyroidism, unspecified: Secondary | ICD-10-CM | POA: Insufficient documentation

## 2023-06-18 DIAGNOSIS — Z7982 Long term (current) use of aspirin: Secondary | ICD-10-CM | POA: Insufficient documentation

## 2023-06-18 DIAGNOSIS — Z79899 Other long term (current) drug therapy: Secondary | ICD-10-CM | POA: Insufficient documentation

## 2023-06-18 DIAGNOSIS — Z981 Arthrodesis status: Secondary | ICD-10-CM | POA: Diagnosis not present

## 2023-06-18 DIAGNOSIS — M48061 Spinal stenosis, lumbar region without neurogenic claudication: Secondary | ICD-10-CM | POA: Insufficient documentation

## 2023-06-18 DIAGNOSIS — D72829 Elevated white blood cell count, unspecified: Secondary | ICD-10-CM

## 2023-06-18 DIAGNOSIS — M4316 Spondylolisthesis, lumbar region: Secondary | ICD-10-CM | POA: Diagnosis not present

## 2023-06-18 DIAGNOSIS — I10 Essential (primary) hypertension: Secondary | ICD-10-CM | POA: Insufficient documentation

## 2023-06-18 DIAGNOSIS — F17210 Nicotine dependence, cigarettes, uncomplicated: Secondary | ICD-10-CM | POA: Insufficient documentation

## 2023-06-18 DIAGNOSIS — M5416 Radiculopathy, lumbar region: Secondary | ICD-10-CM | POA: Insufficient documentation

## 2023-06-18 LAB — CBC WITH DIFFERENTIAL/PLATELET
Abs Immature Granulocytes: 0.06 10*3/uL (ref 0.00–0.07)
Basophils Absolute: 0.1 10*3/uL (ref 0.0–0.1)
Basophils Relative: 0 %
Eosinophils Absolute: 0.3 10*3/uL (ref 0.0–0.5)
Eosinophils Relative: 3 %
HCT: 46 % (ref 36.0–46.0)
Hemoglobin: 15.2 g/dL — ABNORMAL HIGH (ref 12.0–15.0)
Immature Granulocytes: 1 %
Lymphocytes Relative: 15 %
Lymphs Abs: 1.7 10*3/uL (ref 0.7–4.0)
MCH: 29.5 pg (ref 26.0–34.0)
MCHC: 33 g/dL (ref 30.0–36.0)
MCV: 89.1 fL (ref 80.0–100.0)
Monocytes Absolute: 0.7 10*3/uL (ref 0.1–1.0)
Monocytes Relative: 6 %
Neutro Abs: 8.6 10*3/uL — ABNORMAL HIGH (ref 1.7–7.7)
Neutrophils Relative %: 75 %
Platelets: 244 10*3/uL (ref 150–400)
RBC: 5.16 MIL/uL — ABNORMAL HIGH (ref 3.87–5.11)
RDW: 12.9 % (ref 11.5–15.5)
WBC: 11.3 10*3/uL — ABNORMAL HIGH (ref 4.0–10.5)
nRBC: 0 % (ref 0.0–0.2)

## 2023-06-18 SURGERY — POSTERIOR LUMBAR FUSION 1 LEVEL
Anesthesia: General | Site: Back

## 2023-06-18 MED ORDER — ONDANSETRON HCL 4 MG/2ML IJ SOLN: 4.0000 mg | Freq: Four times a day (QID) | INTRAMUSCULAR | Status: DC | PRN

## 2023-06-18 MED ORDER — DEXAMETHASONE SODIUM PHOSPHATE 4 MG/ML IJ SOLN
4.0000 mg | Freq: Four times a day (QID) | INTRAMUSCULAR | Status: DC
  Administered 2023-06-18: 4 mg via INTRAVENOUS
  Filled 2023-06-18: qty 1

## 2023-06-18 MED ORDER — LIOTHYRONINE SODIUM 5 MCG PO TABS
5.0000 ug | ORAL_TABLET | Freq: Every day | ORAL | Status: DC
  Filled 2023-06-18: qty 1

## 2023-06-18 MED ORDER — KETAMINE HCL 50 MG/5ML IJ SOSY
PREFILLED_SYRINGE | INTRAMUSCULAR | Status: DC | PRN
  Administered 2023-06-18: 30 mg via INTRAVENOUS

## 2023-06-18 MED ORDER — THROMBIN 20000 UNITS EX SOLR: CUTANEOUS | Status: DC | PRN

## 2023-06-18 MED ORDER — PHENOL 1.4 % MT LIQD: 1.0000 | OROMUCOSAL | Status: DC | PRN

## 2023-06-18 MED ORDER — ORAL CARE MOUTH RINSE: 15.0000 mL | Freq: Once | OROMUCOSAL | Status: AC

## 2023-06-18 MED ORDER — VILAZODONE HCL 20 MG PO TABS
40.0000 mg | ORAL_TABLET | Freq: Every day | ORAL | Status: DC
  Filled 2023-06-18: qty 2

## 2023-06-18 MED ORDER — AMPHETAMINE-DEXTROAMPHETAMINE 10 MG PO TABS: 30.0000 mg | ORAL_TABLET | Freq: Two times a day (BID) | ORAL | Status: DC

## 2023-06-18 MED ORDER — BERBERINE CHLORIDE 500 MG PO CAPS: 500.0000 mg | ORAL_CAPSULE | Freq: Every day | ORAL | Status: DC

## 2023-06-18 MED ORDER — THROMBIN 20000 UNITS EX SOLR
CUTANEOUS | Status: AC
  Filled 2023-06-18: qty 20000

## 2023-06-18 MED ORDER — TIZANIDINE HCL 4 MG PO TABS: 4.0000 mg | ORAL_TABLET | Freq: Three times a day (TID) | ORAL | 0 refills | Status: AC

## 2023-06-18 MED ORDER — PREGABALIN 75 MG PO CAPS
75.0000 mg | ORAL_CAPSULE | Freq: Four times a day (QID) | ORAL | Status: DC
  Administered 2023-06-18: 75 mg via ORAL
  Filled 2023-06-18: qty 1

## 2023-06-18 MED ORDER — SENNA 8.6 MG PO TABS
1.0000 | ORAL_TABLET | Freq: Two times a day (BID) | ORAL | Status: DC
  Administered 2023-06-18: 8.6 mg via ORAL
  Filled 2023-06-18: qty 1

## 2023-06-18 MED ORDER — DEXMEDETOMIDINE HCL IN NACL 80 MCG/20ML IV SOLN
INTRAVENOUS | Status: DC | PRN
  Administered 2023-06-18: 12 ug via INTRAVENOUS

## 2023-06-18 MED ORDER — GABAPENTIN 300 MG PO CAPS
300.0000 mg | ORAL_CAPSULE | ORAL | Status: AC
Start: 2023-06-18 — End: 2023-06-18
  Administered 2023-06-18: 300 mg via ORAL
  Filled 2023-06-18: qty 1

## 2023-06-18 MED ORDER — ONDANSETRON HCL 4 MG PO TABS: 4.0000 mg | ORAL_TABLET | Freq: Four times a day (QID) | ORAL | Status: DC | PRN

## 2023-06-18 MED ORDER — CEFAZOLIN SODIUM-DEXTROSE 2-4 GM/100ML-% IV SOLN
2.0000 g | Freq: Three times a day (TID) | INTRAVENOUS | Status: DC
  Administered 2023-06-18: 2 g via INTRAVENOUS
  Filled 2023-06-18: qty 100

## 2023-06-18 MED ORDER — SUGAMMADEX SODIUM 200 MG/2ML IV SOLN
INTRAVENOUS | Status: DC | PRN
  Administered 2023-06-18: 200 mg via INTRAVENOUS

## 2023-06-18 MED ORDER — HYDROMORPHONE HCL 1 MG/ML IJ SOLN
INTRAMUSCULAR | Status: AC
  Filled 2023-06-18: qty 2

## 2023-06-18 MED ORDER — LIDOCAINE HCL (CARDIAC) PF 100 MG/5ML IV SOSY
PREFILLED_SYRINGE | INTRAVENOUS | Status: DC | PRN
  Administered 2023-06-18: 60 mg via INTRATRACHEAL

## 2023-06-18 MED ORDER — SODIUM CHLORIDE 0.9% FLUSH: 3.0000 mL | Freq: Two times a day (BID) | INTRAVENOUS | Status: DC

## 2023-06-18 MED ORDER — ROCURONIUM BROMIDE 10 MG/ML (PF) SYRINGE
PREFILLED_SYRINGE | INTRAVENOUS | Status: DC | PRN
  Administered 2023-06-18: 30 mg via INTRAVENOUS
  Administered 2023-06-18: 50 mg via INTRAVENOUS

## 2023-06-18 MED ORDER — CHLORHEXIDINE GLUCONATE CLOTH 2 % EX PADS
6.0000 | MEDICATED_PAD | Freq: Once | CUTANEOUS | Status: DC
Start: 2023-06-18 — End: 2023-06-18

## 2023-06-18 MED ORDER — MIDAZOLAM HCL 2 MG/2ML IJ SOLN
INTRAMUSCULAR | Status: AC
  Filled 2023-06-18: qty 2

## 2023-06-18 MED ORDER — HYDROMORPHONE HCL 1 MG/ML IJ SOLN
INTRAMUSCULAR | Status: AC
  Filled 2023-06-18: qty 0.5

## 2023-06-18 MED ORDER — CHLORHEXIDINE GLUCONATE 0.12 % MT SOLN
15.0000 mL | Freq: Once | OROMUCOSAL | Status: AC
  Administered 2023-06-18: 15 mL via OROMUCOSAL
  Filled 2023-06-18: qty 15

## 2023-06-18 MED ORDER — DEXAMETHASONE 4 MG PO TABS
4.0000 mg | ORAL_TABLET | Freq: Four times a day (QID) | ORAL | Status: DC
  Administered 2023-06-18: 4 mg via ORAL
  Filled 2023-06-18: qty 1

## 2023-06-18 MED ORDER — DOXEPIN HCL 50 MG PO CAPS
50.0000 mg | ORAL_CAPSULE | Freq: Every day | ORAL | Status: DC
  Filled 2023-06-18: qty 1

## 2023-06-18 MED ORDER — FOLIC ACID 1 MG PO TABS: 1.0000 mg | ORAL_TABLET | Freq: Every day | ORAL | Status: DC

## 2023-06-18 MED ORDER — FENTANYL CITRATE (PF) 250 MCG/5ML IJ SOLN
INTRAMUSCULAR | Status: DC | PRN
  Administered 2023-06-18: 100 ug via INTRAVENOUS
  Administered 2023-06-18: 50 ug via INTRAVENOUS

## 2023-06-18 MED ORDER — HYDROMORPHONE HCL 1 MG/ML IJ SOLN
0.2500 mg | INTRAMUSCULAR | Status: DC | PRN
  Administered 2023-06-18: 0.5 mg via INTRAVENOUS
  Administered 2023-06-18: 1 mg via INTRAVENOUS
  Administered 2023-06-18 (×3): 0.5 mg via INTRAVENOUS

## 2023-06-18 MED ORDER — FE FUM-VIT C-VIT B12-FA 460-60-0.01-1 MG PO CAPS: 1.0000 | ORAL_CAPSULE | Freq: Every day | ORAL | Status: DC

## 2023-06-18 MED ORDER — ONDANSETRON HCL 4 MG/2ML IJ SOLN
INTRAMUSCULAR | Status: DC | PRN
  Administered 2023-06-18: 4 mg via INTRAVENOUS

## 2023-06-18 MED ORDER — PHENYLEPHRINE HCL-NACL 20-0.9 MG/250ML-% IV SOLN
INTRAVENOUS | Status: DC | PRN
  Administered 2023-06-18: 20 ug/min via INTRAVENOUS

## 2023-06-18 MED ORDER — HYDROMORPHONE HCL 1 MG/ML IJ SOLN
0.5000 mg | INTRAMUSCULAR | Status: DC | PRN
  Administered 2023-06-18: 0.5 mg via INTRAVENOUS
  Filled 2023-06-18 (×2): qty 0.5

## 2023-06-18 MED ORDER — BUPIVACAINE HCL (PF) 0.25 % IJ SOLN
INTRAMUSCULAR | Status: AC
  Filled 2023-06-18: qty 30

## 2023-06-18 MED ORDER — FENTANYL CITRATE (PF) 250 MCG/5ML IJ SOLN
INTRAMUSCULAR | Status: AC
  Filled 2023-06-18: qty 5

## 2023-06-18 MED ORDER — SURGIRINSE WOUND IRRIGATION SYSTEM - OPTIME: TOPICAL | Status: DC | PRN

## 2023-06-18 MED ORDER — MENTHOL 3 MG MT LOZG: 1.0000 | LOZENGE | OROMUCOSAL | Status: DC | PRN

## 2023-06-18 MED ORDER — DICLOFENAC SODIUM 75 MG PO TBEC
75.0000 mg | DELAYED_RELEASE_TABLET | Freq: Two times a day (BID) | ORAL | Status: DC
  Administered 2023-06-18: 75 mg via ORAL
  Filled 2023-06-18 (×2): qty 1

## 2023-06-18 MED ORDER — ADULT MULTIVITAMIN W/MINERALS CH: 2.0000 | ORAL_TABLET | Freq: Every day | ORAL | Status: DC

## 2023-06-18 MED ORDER — KETAMINE HCL 50 MG/5ML IJ SOSY
PREFILLED_SYRINGE | INTRAMUSCULAR | Status: AC
  Filled 2023-06-18: qty 5

## 2023-06-18 MED ORDER — DHEA 10 MG PO TABS: 10.0000 mg | ORAL_TABLET | Freq: Every day | ORAL | Status: DC

## 2023-06-18 MED ORDER — ACETAMINOPHEN 500 MG PO TABS
1000.0000 mg | ORAL_TABLET | ORAL | Status: AC
  Administered 2023-06-18: 1000 mg via ORAL
  Filled 2023-06-18: qty 2

## 2023-06-18 MED ORDER — HYDROMORPHONE HCL 1 MG/ML IJ SOLN
INTRAMUSCULAR | Status: AC
  Filled 2023-06-18: qty 1

## 2023-06-18 MED ORDER — DEXTROMETHORPHAN-BUPROPION ER 45-105 MG PO TBCR: 1.0000 | EXTENDED_RELEASE_TABLET | Freq: Two times a day (BID) | ORAL | Status: DC

## 2023-06-18 MED ORDER — CLONAZEPAM 1 MG PO TABS
1.0000 mg | ORAL_TABLET | Freq: Three times a day (TID) | ORAL | Status: DC
  Administered 2023-06-18: 1 mg via ORAL
  Filled 2023-06-18: qty 1

## 2023-06-18 MED ORDER — FAMOTIDINE 20 MG PO TABS: 40.0000 mg | ORAL_TABLET | Freq: Every day | ORAL | Status: DC | PRN

## 2023-06-18 MED ORDER — KCL IN DEXTROSE-NACL 10-5-0.45 MEQ/L-%-% IV SOLN
INTRAVENOUS | Status: DC
  Filled 2023-06-18: qty 1000

## 2023-06-18 MED ORDER — CEFAZOLIN SODIUM-DEXTROSE 2-4 GM/100ML-% IV SOLN
2.0000 g | INTRAVENOUS | Status: AC
  Administered 2023-06-18: 2 g via INTRAVENOUS
  Filled 2023-06-18: qty 100

## 2023-06-18 MED ORDER — LIOTHYRONINE SODIUM 25 MCG PO TABS
25.0000 ug | ORAL_TABLET | Freq: Every day | ORAL | Status: DC
Start: 2023-06-19 — End: 2023-06-19
  Filled 2023-06-18: qty 1

## 2023-06-18 MED ORDER — POLYETHYLENE GLYCOL 3350 17 G PO PACK: 17.0000 g | PACK | Freq: Every day | ORAL | Status: DC

## 2023-06-18 MED ORDER — DEXAMETHASONE SODIUM PHOSPHATE 10 MG/ML IJ SOLN
INTRAMUSCULAR | Status: DC | PRN
  Administered 2023-06-18: 10 mg via INTRAVENOUS

## 2023-06-18 MED ORDER — PROGESTERONE MICRONIZED 100 MG PO CAPS
100.0000 mg | ORAL_CAPSULE | Freq: Every day | ORAL | Status: DC
  Filled 2023-06-18: qty 1

## 2023-06-18 MED ORDER — MIDAZOLAM HCL 5 MG/5ML IJ SOLN
INTRAMUSCULAR | Status: DC | PRN
  Administered 2023-06-18: 2 mg via INTRAVENOUS

## 2023-06-18 MED ORDER — CHLORHEXIDINE GLUCONATE CLOTH 2 % EX PADS: 6.0000 | MEDICATED_PAD | Freq: Once | CUTANEOUS | Status: DC

## 2023-06-18 MED ORDER — ASPIRIN 81 MG PO TBEC: 81.0000 mg | DELAYED_RELEASE_TABLET | Freq: Every day | ORAL | Status: DC

## 2023-06-18 MED ORDER — CARIPRAZINE HCL 3 MG PO CAPS: 3.0000 mg | ORAL_CAPSULE | Freq: Every day | ORAL | Status: DC

## 2023-06-18 MED ORDER — THROMBIN 5000 UNITS EX SOLR: OROMUCOSAL | Status: DC | PRN

## 2023-06-18 MED ORDER — SODIUM CHLORIDE 0.9% FLUSH: 3.0000 mL | INTRAVENOUS | Status: DC | PRN

## 2023-06-18 MED ORDER — THROMBIN 5000 UNITS EX SOLR
CUTANEOUS | Status: AC
  Filled 2023-06-18: qty 5000

## 2023-06-18 MED ORDER — SODIUM CHLORIDE 0.9 % IV SOLN
250.0000 mL | INTRAVENOUS | Status: DC
  Administered 2023-06-18: 250 mL via INTRAVENOUS

## 2023-06-18 MED ORDER — 0.9 % SODIUM CHLORIDE (POUR BTL) OPTIME
TOPICAL | Status: DC | PRN
  Administered 2023-06-18: 1000 mL

## 2023-06-18 MED ORDER — ACETAMINOPHEN 500 MG PO TABS
1000.0000 mg | ORAL_TABLET | Freq: Four times a day (QID) | ORAL | Status: DC
  Administered 2023-06-18: 1000 mg via ORAL
  Filled 2023-06-18: qty 2

## 2023-06-18 MED ORDER — OXYCODONE HCL 20 MG PO TABS: 20.0000 mg | ORAL_TABLET | Freq: Every day | ORAL | 0 refills | Status: AC

## 2023-06-18 MED ORDER — METHOCARBAMOL 750 MG PO TABS: 750.0000 mg | ORAL_TABLET | Freq: Three times a day (TID) | ORAL | Status: DC | PRN

## 2023-06-18 MED ORDER — OXYCODONE HCL 5 MG PO TABS
20.0000 mg | ORAL_TABLET | Freq: Every day | ORAL | Status: DC
  Administered 2023-06-18 (×2): 20 mg via ORAL
  Filled 2023-06-18 (×2): qty 4

## 2023-06-18 MED ORDER — LEMBOREXANT 10 MG PO TABS: 10.0000 mg | ORAL_TABLET | Freq: Every day | ORAL | Status: DC

## 2023-06-18 MED ORDER — LACTATED RINGERS IV SOLN: INTRAVENOUS | Status: DC

## 2023-06-18 MED ORDER — HYDROMORPHONE HCL 1 MG/ML IJ SOLN
INTRAMUSCULAR | Status: DC | PRN
  Administered 2023-06-18 (×2): .5 mg via INTRAVENOUS

## 2023-06-18 MED ORDER — LITHIUM CARBONATE ER 450 MG PO TBCR
450.0000 mg | EXTENDED_RELEASE_TABLET | Freq: Every day | ORAL | Status: DC
  Filled 2023-06-18: qty 1

## 2023-06-18 MED ORDER — PROPOFOL 10 MG/ML IV BOLUS
INTRAVENOUS | Status: DC | PRN
  Administered 2023-06-18: 200 mg via INTRAVENOUS

## 2023-06-18 MED ORDER — LEVOTHYROXINE SODIUM 112 MCG PO TABS
112.0000 ug | ORAL_TABLET | Freq: Every day | ORAL | Status: DC
Start: 2023-06-19 — End: 2023-06-19
  Filled 2023-06-18: qty 1

## 2023-06-18 SURGICAL SUPPLY — 52 items
ALLOGRAFT BONE FIBER KORE 5 (Bone Implant) IMPLANT
BAG COUNTER SPONGE SURGICOUNT (BAG) ×1 IMPLANT
BASKET BONE COLLECTION (BASKET) ×1 IMPLANT
BENZOIN TINCTURE PRP APPL 2/3 (GAUZE/BANDAGES/DRESSINGS) ×1 IMPLANT
BLADE BONE MILL MEDIUM (MISCELLANEOUS) ×1 IMPLANT
BLADE CLIPPER SURG (BLADE) IMPLANT
BUR CARBIDE MATCH 3.0 (BURR) ×1 IMPLANT
CANISTER SUCT 3000ML PPV (MISCELLANEOUS) ×1 IMPLANT
CNTNR URN SCR LID CUP LEK RST (MISCELLANEOUS) ×1 IMPLANT
COVER BACK TABLE 60X90IN (DRAPES) ×1 IMPLANT
DERMABOND ADVANCED .7 DNX12 (GAUZE/BANDAGES/DRESSINGS) ×1 IMPLANT
DRAPE C-ARM 42X72 X-RAY (DRAPES) ×2 IMPLANT
DRAPE C-ARMOR (DRAPES) ×1 IMPLANT
DRAPE LAPAROTOMY 100X72X124 (DRAPES) ×1 IMPLANT
DRAPE SURG 17X23 STRL (DRAPES) ×1 IMPLANT
DRSG OPSITE POSTOP 4X6 (GAUZE/BANDAGES/DRESSINGS) IMPLANT
DURAPREP 26ML APPLICATOR (WOUND CARE) ×1 IMPLANT
ELECT REM PT RETURN 9FT ADLT (ELECTROSURGICAL) ×1
ELECTRODE REM PT RTRN 9FT ADLT (ELECTROSURGICAL) ×1 IMPLANT
EVACUATOR 1/8 PVC DRAIN (DRAIN) ×1 IMPLANT
GAUZE 4X4 16PLY ~~LOC~~+RFID DBL (SPONGE) IMPLANT
GLOVE BIO SURGEON STRL SZ7 (GLOVE) IMPLANT
GLOVE BIO SURGEON STRL SZ8 (GLOVE) ×2 IMPLANT
GLOVE BIOGEL PI IND STRL 7.0 (GLOVE) IMPLANT
GOWN STRL REUS W/ TWL LRG LVL3 (GOWN DISPOSABLE) IMPLANT
GOWN STRL REUS W/ TWL XL LVL3 (GOWN DISPOSABLE) ×2 IMPLANT
GOWN STRL REUS W/TWL 2XL LVL3 (GOWN DISPOSABLE) IMPLANT
GRAFT BONE PROTEIOS MED 2.5CC (Orthopedic Implant) IMPLANT
HEMOSTAT POWDER KIT SURGIFOAM (HEMOSTASIS) ×1 IMPLANT
KIT BASIN OR (CUSTOM PROCEDURE TRAY) ×1 IMPLANT
KIT TURNOVER KIT B (KITS) ×1 IMPLANT
MILL BONE PREP (MISCELLANEOUS) ×1 IMPLANT
NDL HYPO 25X1 1.5 SAFETY (NEEDLE) ×1 IMPLANT
NEEDLE HYPO 25X1 1.5 SAFETY (NEEDLE) ×1 IMPLANT
NS IRRIG 1000ML POUR BTL (IV SOLUTION) ×1 IMPLANT
PACK LAMINECTOMY NEURO (CUSTOM PROCEDURE TRAY) ×1 IMPLANT
PAD ARMBOARD 7.5X6 YLW CONV (MISCELLANEOUS) ×3 IMPLANT
ROD LORD LIPPED TI 5.5X45 (Rod) IMPLANT
SCREW POLYAXIAL TULIP (Screw) IMPLANT
SCREW SHANK MOD 5.5X40 (Screw) IMPLANT
SET SCREW SPNE (Screw) IMPLANT
SOLUTION IRRIG SURGIPHOR (IV SOLUTION) ×1 IMPLANT
SPACER IDENTITI 9X9X25 5D (Spacer) IMPLANT
SPONGE SURGIFOAM ABS GEL 100 (HEMOSTASIS) ×1 IMPLANT
SPONGE T-LAP 4X18 ~~LOC~~+RFID (SPONGE) IMPLANT
STRIP CLOSURE SKIN 1/2X4 (GAUZE/BANDAGES/DRESSINGS) ×2 IMPLANT
SUT VIC AB 0 CT1 18XCR BRD8 (SUTURE) ×1 IMPLANT
SUT VIC AB 2-0 CP2 18 (SUTURE) ×1 IMPLANT
SUT VIC AB 3-0 SH 8-18 (SUTURE) ×2 IMPLANT
TOWEL GREEN STERILE (TOWEL DISPOSABLE) ×1 IMPLANT
TOWEL GREEN STERILE FF (TOWEL DISPOSABLE) ×1 IMPLANT
WATER STERILE IRR 1000ML POUR (IV SOLUTION) ×1 IMPLANT

## 2023-06-18 NOTE — Discharge Summary (Signed)
Physician Discharge Summary  Patient ID: [name] MRN: [id number] DOB/AGE: 09-13-1970 52 y.o.  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 The patient tolerated the procedure well and was taken to the recovery room and then to the floor in stable condition. The hospital course was routine. There were no complications. The wound remained clean dry and intact. Pt had appropriate back soreness. No complaints of leg pain or new N/T/W. The patient remained afebrile with stable vital signs, and tolerated a regular diet. The patient continued to increase activities, and pain was well controlled with oral pain medications.   Consults: None  Significant Diagnostic Studies:  Results for orders placed or performed during the hospital encounter of 06/18/23  CBC with Differential/Platelet   Collection Time: 06/18/23  9:30 AM  Result Value Ref Range   WBC 11.3 (H) 4.0 - 10.5 K/uL   RBC 5.16 (H) 3.87 - 5.11 MIL/uL   Hemoglobin 15.2 (H) 12.0 - 15.0 g/dL   HCT 40.9 81.1 - 91.4 %   MCV 89.1 80.0 - 100.0 fL   MCH 29.5 26.0 - 34.0 pg   MCHC 33.0 30.0 - 36.0 g/dL   RDW 78.2 95.6 - 21.3 %   Platelets 244 150 - 400 K/uL   nRBC 0.0 0.0 - 0.2 %   Neutrophils Relative % 75 %   Neutro Abs 8.6 (H) 1.7 - 7.7 K/uL   Lymphocytes Relative 15 %   Lymphs Abs 1.7 0.7 - 4.0 K/uL   Monocytes Relative 6 %   Monocytes Absolute 0.7 0.1 - 1.0 K/uL   Eosinophils Relative 3 %   Eosinophils Absolute 0.3 0.0 - 0.5 K/uL   Basophils Relative 0 %   Basophils Absolute 0.1 0.0 - 0.1 K/uL   Immature Granulocytes 1 %   Abs Immature Granulocytes 0.06 0.00 - 0.07 K/uL    DG Lumbar Spine 2-3 Views Result Date:  06/18/2023 CLINICAL DATA:  [id number] Surgery, elective [id number] EXAM: LUMBAR SPINE - 2-3 VIEW COMPARISON:  Radiograph 03/23/2023 FINDINGS: Five fluoroscopic spot views of the lumbar spine obtained in the operating room. Previous L4 through S1 fusion hardware. Extension of posterior fusion to the L3 level with new L3-L4 interbody spacer. Fluoroscopy time 31.2 seconds. Dose 22.8 mGy. IMPRESSION: Intraoperative fluoroscopy during lumbar surgery. Electronically Signed   By: Narda Rutherford M.D.   On: 06/18/2023 16:50   DG C-Arm 1-60 Min-No Report Result Date: 06/18/2023 Fluoroscopy was utilized by the requesting physician.  No radiographic interpretation.   DG C-Arm 1-60 Min-No Report Result Date: 06/18/2023 Fluoroscopy was utilized by the requesting physician.  No radiographic interpretation.    Antibiotics:  Anti-infectives (From admission, onward)    Start     Dose/Rate Route Frequency Ordered Stop   06/18/23 1800  ceFAZolin (ANCEF) IVPB 2g/100 mL premix        2 g 200 mL/hr over 30 Minutes Intravenous Every 8 hours 06/18/23 1434 06/19/23 0959   06/18/23 0900  ceFAZolin (ANCEF) IVPB 2g/100 mL premix        2 g 200 mL/hr over 30 Minutes Intravenous On call to O.R. 06/18/23 0846 06/18/23 1046       Discharge Exam: Blood pressure (!) 114/59, pulse 86, temperature 97.9 F (36.6 C), temperature  source Oral, resp. rate 20, height 5' 7.5" (1.715 m), weight 94.8 kg, SpO2 90%. Neurologic: Grossly normal Ambulating and voiding well incision cdi   Discharge Medications:   Allergies as of 06/18/2023       Reactions   Conjugated Estrogens Other (See Comments)   Estradiol patch interacted with antidepressants   Lactose Intolerance (gi) Diarrhea   GI Distress        Medication List     TAKE these medications    amphetamine-dextroamphetamine 30 MG tablet Commonly known as: ADDERALL Take 30 mg by mouth 2 (two) times daily.   aspirin EC 81 MG tablet Take 81 mg by mouth daily. Swallow  whole.   Auvelity 45-105 MG Tbcr Generic drug: Dextromethorphan-buPROPion ER Take 1 tablet by mouth 2 (two) times daily.   benzonatate 100 MG capsule Commonly known as: TESSALON Take 100 mg by mouth 3 (three) times daily as needed for cough.   Berberine HCI 500 MG Caps Generic drug: Berberine Chloride Take 500 mg by mouth daily.   Biotin 98119 MCG Tabs Take 1 tablet by mouth daily.   clonazePAM 1 MG tablet Commonly known as: KLONOPIN Take 1 mg by mouth every 8 (eight) hours.   DayVigo 10 MG Tabs Generic drug: Lemborexant Take 10 mg by mouth at bedtime.   DHEA 10 MG Tabs Take 10 mg by mouth daily.   diclofenac 75 MG EC tablet Commonly known as: VOLTAREN Take 75 mg by mouth 2 (two) times daily.   doxepin 50 MG capsule Commonly known as: SINEQUAN Take 50 mg by mouth at bedtime.   doxycycline 100 MG tablet Commonly known as: VIBRA-TABS Take 1 tablet (100 mg total) by mouth 2 (two) times daily.   famotidine 40 MG tablet Commonly known as: PEPCID Take 40 mg by mouth daily as needed for heartburn.   folic acid 800 MCG tablet Commonly known as: FOLVITE Take 800 mcg by mouth at bedtime.   Iron Complex Caps Take 100 mg by mouth daily. 50 mg each   levothyroxine 112 MCG tablet Commonly known as: SYNTHROID Take 112 mcg by mouth daily before breakfast.   lidocaine 5 % Commonly known as: LIDODERM Place 3 patches onto the skin daily as needed (pain).   liothyronine 5 MCG tablet Commonly known as: CYTOMEL Take 5 mcg by mouth See admin instructions. Take with 25 mg for a total of 30 mg in the morning   liothyronine 25 MCG tablet Commonly known as: CYTOMEL Take 25 mcg by mouth See admin instructions. Take with 5 mg for a total of 30 mg in the morning   lithium carbonate 450 MG ER tablet Commonly known as: ESKALITH Take 450 mg by mouth at bedtime.   MAGNESIUM PO Take 560 mg by mouth daily. 280 mg each   Melatonin 10 MG Tabs Take 10 mg by mouth at bedtime.    methocarbamol 750 MG tablet Commonly known as: ROBAXIN Take 750 mg by mouth every 8 (eight) hours as needed for muscle spasms.   multivitamin with minerals Tabs tablet Take 2 tablets by mouth daily. [place]   Narcan 4 MG/0.1ML Liqd nasal spray kit Generic drug: naloxone Place 1 spray into the nose as needed (OR).   ondansetron 8 MG disintegrating tablet Commonly known as: ZOFRAN-ODT Take 8 mg by mouth every 8 (eight) hours as needed for nausea or vomiting.   OVER THE COUNTER MEDICATION Take 2 tablets by mouth daily. Solaray   Oxycodone HCl 20 MG Tabs Take 1 tablet (  20 mg total) by mouth 6 (six) times daily.   polyethylene glycol 17 g packet Commonly known as: MIRALAX / GLYCOLAX Take 17 g by mouth daily.   pregabalin 75 MG capsule Commonly known as: LYRICA Take 75 mg by mouth 4 (four) times daily.   progesterone 100 MG capsule Commonly known as: PROMETRIUM Take 100 mg by mouth at bedtime.   SUPER B COMPLEX PO Take 1 tablet by mouth daily.   tiZANidine 4 MG tablet Commonly known as: ZANAFLEX Take 1 tablet (4 mg total) by mouth every 8 (eight) hours.   valACYclovir 1000 MG tablet Commonly known as: VALTREX Take 2,000 mg by mouth 2 (two) times daily as needed (outbreaks).   Viibryd 40 MG Tabs Generic drug: Vilazodone HCl Take 40 mg by mouth daily.   VITAMIN K2-VITAMIN D3 PO Take 1 tablet by mouth at bedtime. 5000 mg /100 mcg   Vraylar 3 MG capsule Generic drug: cariprazine Take 3 mg by mouth daily.   Zepbound 10 MG/0.5ML Pen Generic drug: tirzepatide Inject 10 mg into the skin once a week.   zinc gluconate 50 MG tablet Take 50 mg by mouth daily.               Durable Medical Equipment  (From admission, onward)           Start     Ordered   06/18/23 1435  DME Walker rolling  Once       Question:  Patient needs a walker to treat with the following condition  Answer:  S/P lumbar fusion   06/18/23 1434   06/18/23 1435  DME 3 n 1  Once         06/18/23 1434            Disposition: home   Final Dx: PLIF L3-4  Discharge Instructions      Remove dressing in 72 hours   Complete by: As directed    Call MD for:   Complete by: As directed    Call MD for:  difficulty breathing, headache or visual disturbances   Complete by: As directed    Call MD for:  hives   Complete by: As directed    Call MD for:  persistant nausea and vomiting   Complete by: As directed    Call MD for:  severe uncontrolled pain   Complete by: As directed    Call MD for:  temperature >100.4   Complete by: As directed    Diet - low sodium heart healthy   Complete by: As directed    Driving Restrictions   Complete by: As directed    No driving for 2 weeks, no riding in the car for 1 week   Increase activity slowly   Complete by: As directed    Lifting restrictions   Complete by: As directed    No lifting more than 8 lbs          Signed: Tiana Loft Ladiamond Gallina 06/18/2023, 7:57 PM

## 2023-06-18 NOTE — Anesthesia Procedure Notes (Signed)
Procedure Name: Intubation Date/Time: 06/18/2023 10:18 AM  Performed by: Allyn Kenner, CRNAPre-anesthesia Checklist: Patient identified, Emergency Drugs available, Suction available and Patient being monitored Patient Re-evaluated:Patient Re-evaluated prior to induction Oxygen Delivery Method: Circle System Utilized Preoxygenation: Pre-oxygenation with 100% oxygen Induction Type: IV induction Ventilation: Mask ventilation without difficulty Laryngoscope Size: Mac and 3 Grade View: Grade II Tube type: Oral Number of attempts: 1 Airway Equipment and Method: Stylet and Oral airway Placement Confirmation: ETT inserted through vocal cords under direct vision, positive ETCO2 and breath sounds checked- equal and bilateral Secured at: 22 cm Tube secured with: Tape Dental Injury: Teeth and Oropharynx as per pre-operative assessment

## 2023-06-18 NOTE — Anesthesia Postprocedure Evaluation (Signed)
Anesthesia Post Note  Patient: [name]  Procedure(s) Performed: Posterior Lumbar Interbody Fusion - Lumbar Three-Lumbar Four - Posterior Lateral and Interbody Fusion, with Extension of Hardware (Back)     Patient location during evaluation: PACU Anesthesia Type: General Level of consciousness: awake and alert Pain management: pain level controlled Vital Signs Assessment: post-procedure vital signs reviewed and stable Respiratory status: spontaneous breathing, nonlabored ventilation, respiratory function stable and patient connected to nasal cannula oxygen Cardiovascular status: blood pressure returned to baseline and stable Postop Assessment: no apparent nausea or vomiting Anesthetic complications: no  There were no known notable events for this encounter.  Last Vitals:  Vitals:   06/18/23 1345 06/18/23 1430  BP: 132/87   Pulse: 87   Resp: 20 15  Temp:    SpO2: 94%     Last Pain:  Vitals:   06/18/23 1330  TempSrc:   PainSc: 10-Worst pain ever                 Kennieth Rad

## 2023-06-18 NOTE — Op Note (Signed)
06/18/2023  1:02 PM  PATIENT:  [name]  52 y.o. 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 Decompressive lumbar laminectomy, hemi facetectomy and foraminotomies L3-4 requiring more work than would be required for a simple exposure of the disk for PLIF in order to adequately decompress the neural elements and address the spinal stenosis 2. Posterior lumbar interbody fusion L3-4 using PTI interbody cages packed with morcellized allograft and autograft  3. Posterior fixation L3-4 using ATEC cortical pedicle screws.  4. Intertransverse arthrodesis L3-4 using morcellized autograft and allograft. 5.  Lumbar reexploration with extraction of segmental instrumentation L4-S1  SURGEON:  Marikay Alar, MD  ASSISTANTS: Verlin Dike, FNP  ANESTHESIA:  General  EBL: 150 ml  Total I/O In: 100 [IV Piggyback:100] Out: 150 [Blood:150]  BLOOD ADMINISTERED:none  DRAINS: none   INDICATION FOR PROCEDURE: This patient presented with back pain and leg pain. Imaging revealed a low listhesis L3-4 with adjacent level stenosis at L3-4 above previous L4-S1 fusion. The patient tried a reasonable attempt at conservative medical measures without relief. I recommended decompression and instrumented fusion to address the stenosis as well as the segmental  instability.  Patient understood the risks, benefits, and alternatives and potential outcomes and wished to proceed.  PROCEDURE DETAILS:  The patient was brought to the operating room. After induction of generalized endotracheal anesthesia the patient was rolled into the prone position on chest rolls and all pressure points were padded. The patient's lumbar region was cleaned and then prepped with DuraPrep and draped in the usual sterile fashion. Anesthesia was injected and then a dorsal midline incision was made and carried  down to the lumbosacral fascia. The fascia was opened and the paraspinous musculature was taken down in a subperiosteal fashion to expose L3-4 as well as the previously placed instrumentation. A self-retaining retractor was placed.  I remove the locking caps from the previously placed pedicle screws and remove the rods.  All 6 screws had excellent purchase and pulling on any single screw would move the entire construct suggesting solid arthrodesis L4-5 L5-S1.  Intraoperative fluoroscopy confirmed my level, and I started with placement of the L3 cortical pedicle screws. The pedicle screw entry zones were identified utilizing surface landmarks and  AP and lateral fluoroscopy. I scored the cortex with the high-speed drill and then used the hand drill to drill an upward and outward direction into the pedicle. I then tapped line to line. I then placed a 5.5 x 40 mm cortical pedicle screw into the pedicles of L3 bilaterally.    I then turned my attention to the decompression and complete lumbar laminectomies, hemi- facetectomies, and foraminotomies were performed at L3-4.  My nurse practitioner was directly involved in the decompression and exposure of the neural elements. the patient had significant spinal stenosis and this required more work than would be required for a simple exposure of the disc for posterior lumbar interbody fusion which would only require a limited laminotomy. Much more generous decompression and generous foraminotomy was undertaken in order to adequately decompress the neural elements and address the patient's leg pain. The yellow ligament was removed to expose the underlying dura and nerve roots, and generous foraminotomies were performed to adequately decompress the neural elements. Both the exiting and traversing nerve roots were decompressed on both sides until a coronary dilator passed easily along the nerve roots. Once the decompression was complete, I turned my  attention to the posterior  lower lumbar interbody fusion. The epidural venous vasculature was coagulated and cut sharply. Disc space was incised and the initial discectomy was performed with pituitary rongeurs. The disc space was distracted with sequential distractors to a height of 9 mm. We then used a series of scrapers and shavers to prepare the endplates for fusion. The midline was prepared with Epstein curettes. Once the complete discectomy was finished, we packed an appropriate sized interbody cage with local autograft and morcellized allograft, gently retracted the nerve root, and tapped the cage into position at L3-4.  The midline between the cages was packed with morselized autograft and allograft.     We then decorticated the transverse processes and laid a mixture of morcellized autograft and allograft out over these to perform intertransverse arthrodesis at L3-4. We then placed lordotic rods into the multiaxial screw heads of the pedicle screws of L3 and L4 and locked these in position with the locking caps and anti-torque device. We then checked our construct with AP and lateral fluoroscopy. Irrigated with copious amounts of 0.5% povidone iodine solution followed by saline solution. Inspected the nerve roots once again to assure adequate decompression, lined to the dura with Gelfoam,  and then we closed the muscle and the fascia with 0 Vicryl. Closed the subcutaneous tissues with 2-0 Vicryl and subcuticular tissues with 3-0 Vicryl. The skin was closed with benzoin and Steri-Strips. Dressing was then applied, the patient was awakened from general anesthesia and transported to the recovery room in stable condition. At the end of the procedure all sponge, needle and instrument counts were correct.   PLAN OF CARE: admit to inpatient  PATIENT DISPOSITION:  PACU - hemodynamically stable.   Delay start of Pharmacological VTE agent (>24hrs) due to surgical blood loss or risk of bleeding:  yes

## 2023-06-18 NOTE — Transfer of Care (Signed)
Immediate Anesthesia Transfer of Care Note  Patient: [name]  Procedure(s) Performed: Posterior Lumbar Interbody Fusion - Lumbar Three-Lumbar Four - Posterior Lateral and Interbody Fusion, with Extension of Hardware (Back)  Patient Location: PACU  Anesthesia Type:General  Level of Consciousness: sedated and responds to stimulation  Airway & Oxygen Therapy: Patient Spontanous Breathing and Patient connected to face mask oxygen  Post-op Assessment: Report given to RN and Post -op Vital signs reviewed and stable  Post vital signs: Reviewed and stable  Last Vitals:  Vitals Value Taken Time  BP 110/59 06/18/23 1303  Temp    Pulse 78 06/18/23 1310  Resp 9 06/18/23 1310  SpO2 93 % 06/18/23 1310  Vitals shown include unfiled device data.  Last Pain:  Vitals:   06/18/23 0941  TempSrc:   PainSc: 7       Patients Stated Pain Goal: 2 (06/18/23 0941)  Complications: No notable events documented.

## 2023-06-18 NOTE — H&P (Signed)
Subjective: Patient is a 52 y.o. female admitted for back and leg pain. Onset of symptoms was several months ago, gradually worsening since that time.  The pain is rated severe, and is located at the across the lower back and radiates to legs. The pain is described as aching and occurs all day. The symptoms have been progressive. Symptoms are exacerbated by exercise, standing, and walking for more than a few minutes. 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   Past Medical History:  Diagnosis Date   ADD (attention deficit disorder)    Anxiety    Breast mass 02/2014   right   Cervical pseudoarthrosis (HCC)    Chronic back pain    Chronic constipation    Complication of anesthesia    after 07/01/2017 cervical c5-t2, pt states she had hallucination x 2 wks after surgery   Depression    Fibromyalgia    GERD (gastroesophageal reflux disease)    H/O cardiac radiofrequency ablation 1996   History of kidney stones    one lodged in right kidney - 3mm    HSV infection    Hypertension    no longer on medications   Hyperthyroidism    at on time - now hypothyroidism   Hypothyroidism    Neuromuscular disorder (HCC)    sciatica    PONV (postoperative nausea and vomiting)    Rheumatoid arthritis (HCC)    OA- lumbar & cervical     Sleep apnea    CPAP used nightly   Wolff-Parkinson-White (WPW) syndrome    Wolff-Parkinson-White pattern 1994   had ablation surgery    Past Surgical History:  Procedure Laterality Date   ABDOMINAL HYSTERECTOMY     BACK SURGERY     x 5 lumbar, 3 cervical   BUNIONECTOMY Bilateral    x4 (2 on each foot)   CARDIAC ELECTROPHYSIOLOGY MAPPING AND ABLATION     WPW   CERVICAL SPINE SURGERY     fusions x 3   COLONOSCOPY  04/06/2013   CYSTOSCOPY N/A 04/06/2018   Procedure: CYSTOSCOPY;  Surgeon: Huel Cote, MD;  Location: [facility];  Service: Gynecology;  Laterality: N/A;   ESOPHAGOGASTRODUODENOSCOPY  04/06/2013   LAPAROSCOPY  04/06/2018    Procedure: LAPAROSCOPY DIAGNOSTIC;  Surgeon: Huel Cote, MD;  Location: [facility];  Service: Gynecology;;   LUMBAR LAMINECTOMY/DECOMPRESSION MICRODISCECTOMY Right 09/02/2016   Procedure: RIGHT LUMBAR FIVE-SACRAL ONE REDO MICRODISCECTOMY;  Surgeon: Tia Alert, MD;  Location: [facility] OR;  Service: Neurosurgery;  Laterality: Right;   LUMBAR SPINE SURGERY     x 4   POSTERIOR CERVICAL FUSION/FORAMINOTOMY N/A 07/01/2017   Procedure: Posterior Cervical Fusion with lateral mass fixation Cervical Six-Seven;  Surgeon: Tia Alert, MD;  Location: [facility] OR;  Service: Neurosurgery;  Laterality: N/A;  Posterior Cervical Fusion with lateral mass fixation Cervical Six-Seven   ROTATOR CUFF REPAIR Right 03/24/2023   SIGMOIDOSCOPY     SUPRACERVICAL ABDOMINAL HYSTERECTOMY N/A 04/06/2018   Procedure: SUPRACERVICAL HYSTERECTOMY ABDOMINAL WITH LEFT OOPHORECTOMY AND BILATERAL SALPINGECTOMY;  Surgeon: Huel Cote, MD;  Location: [facility];  Service: Gynecology;  Laterality: N/A;   THYROIDECTOMY  1996   TONSILLECTOMY     TOTAL KNEE ARTHROPLASTY Right 02/28/2021   Procedure: TOTAL KNEE ARTHROPLASTY, cortisone injection left knee;  Surgeon: Jodi Geralds, MD;  Location: [facility];  Service: Orthopedics;  Laterality: Right;   WISDOM TOOTH EXTRACTION      Prior to Admission medications   Medication Sig Start Date End Date Taking? Authorizing Provider  amphetamine-dextroamphetamine (ADDERALL) 30 MG tablet Take 30 mg by mouth 2 (two) times daily. 02/16/21  Yes [provider]  aspirin EC 81 MG tablet Take 81 mg by mouth daily. Swallow whole.   Yes [provider]  B Complex-C (SUPER B COMPLEX PO) Take 1 tablet by mouth daily.   Yes [provider]  benzonatate (TESSALON) 100 MG capsule Take 100 mg by mouth 3 (three) times daily as needed for cough. 06/09/23  Yes [provider]  Berberine Chloride (BERBERINE HCI) 500 MG CAPS Take 500 mg by mouth daily.   Yes [provider]   Biotin 32440 MCG TABS Take 1 tablet by mouth daily.   Yes [provider]  clonazePAM (KLONOPIN) 1 MG tablet Take 1 mg by mouth every 8 (eight) hours. 03/03/13  Yes [provider]  DAYVIGO 10 MG TABS Take 10 mg by mouth at bedtime. 02/10/21  Yes [provider]  Dextromethorphan-buPROPion ER (AUVELITY) 45-105 MG TBCR Take 1 tablet by mouth 2 (two) times daily.   Yes [provider]  DHEA 10 MG TABS Take 10 mg by mouth daily.   Yes [provider]  diclofenac (VOLTAREN) 75 MG EC tablet Take 75 mg by mouth 2 (two) times daily.   Yes [provider]  doxepin (SINEQUAN) 50 MG capsule Take 50 mg by mouth at bedtime.   Yes [provider]  doxycycline (VIBRA-TABS) 100 MG tablet Take 1 tablet (100 mg total) by mouth 2 (two) times daily. 05/31/23  Yes RegalKirstie Peri, DPM  famotidine (PEPCID) 40 MG tablet Take 40 mg by mouth daily as needed for heartburn. 03/22/18  Yes [provider]  folic acid (FOLVITE) 800 MCG tablet Take 800 mcg by mouth at bedtime.   Yes [provider]  Iron Combinations (IRON COMPLEX) CAPS Take 100 mg by mouth daily. 50 mg each   Yes [provider]  levothyroxine (SYNTHROID) 112 MCG tablet Take 112 mcg by mouth daily before breakfast.   Yes [provider]  lidocaine (LIDODERM) 5 % Place 3 patches onto the skin daily as needed (pain).  08/19/16  Yes [provider]  liothyronine (CYTOMEL) 25 MCG tablet Take 25 mcg by mouth See admin instructions. Take with 5 mg for a total of 30 mg in the morning   Yes [provider]  liothyronine (CYTOMEL) 5 MCG tablet Take 5 mcg by mouth See admin instructions. Take with 25 mg for a total of 30 mg in the morning   Yes [provider]  lithium carbonate (ESKALITH) 450 MG CR tablet Take 450 mg by mouth at bedtime.   Yes [provider]  MAGNESIUM PO Take 560 mg by mouth daily. 280 mg each   Yes [provider]   Melatonin 10 MG TABS Take 10 mg by mouth at bedtime.   Yes [provider]  methocarbamol (ROBAXIN) 750 MG tablet Take 750 mg by mouth every 8 (eight) hours as needed for muscle spasms.   Yes [provider]  Multiple Vitamin (MULTIVITAMIN WITH MINERALS) TABS tablet Take 2 tablets by mouth daily. [place]   Yes [provider]  naloxone [facility]) nasal spray 4 mg/0.1 mL Place 1 spray into the nose as needed (OR).   Yes [provider]  ondansetron (ZOFRAN-ODT) 8 MG disintegrating tablet Take 8 mg by mouth every 8 (eight) hours as needed for nausea or vomiting.   Yes [provider]  OVER THE COUNTER MEDICATION Take  2 tablets by mouth daily. [place]   Yes [provider]  Oxycodone HCl 20 MG TABS Take 20 mg by mouth 6 (six) times daily.   Yes [provider]  polyethylene glycol (MIRALAX / GLYCOLAX) 17 g packet Take 17 g by mouth daily.   Yes [provider]  pregabalin (LYRICA) 75 MG capsule Take 75 mg by mouth 4 (four) times daily.   Yes [provider]  progesterone (PROMETRIUM) 100 MG capsule Take 100 mg by mouth at bedtime. 05/14/23  Yes [provider]  tiZANidine (ZANAFLEX) 4 MG tablet Take 4 mg by mouth every 8 (eight) hours. 02/27/21  Yes [provider]  valACYclovir (VALTREX) 1000 MG tablet Take 2,000 mg by mouth 2 (two) times daily as needed (outbreaks).  06/13/17  Yes [provider]  VIIBRYD 40 MG TABS Take 40 mg by mouth daily.  01/11/13  Yes [provider]  Vitamin D-Vitamin K (VITAMIN K2-VITAMIN D3 PO) Take 1 tablet by mouth at bedtime. 5000 mg /100 mcg   Yes [provider]  VRAYLAR 3 MG capsule Take 3 mg by mouth daily.   Yes [provider]  ZEPBOUND 10 MG/0.5ML Pen Inject 10 mg into the skin once a week. 04/27/23  Yes [provider]  zinc gluconate 50 MG tablet Take 50 mg by mouth daily.   Yes [provider]   Allergies   Allergen Reactions   Conjugated Estrogens Other (See Comments)    Estradiol patch interacted with antidepressants   Lactose Intolerance (Gi) Diarrhea    GI Distress    Social History   Tobacco Use   Smoking status: Some Days    Current packs/day: 0.00    Average packs/day: 0.5 packs/day for 21.0 years (10.5 ttl pk-yrs)    Types: Cigarettes    Start date: 09/02/1995    Last attempt to quit: 09/01/2016    Years since quitting: 6.7   Smokeless tobacco: Former  Substance Use Topics   Alcohol use: No    Family History  Problem Relation Age of Onset   Colon polyps Mother    Mitral valve prolapse Mother    High Cholesterol Mother    Cardiomyopathy Brother      Review of Systems  Positive ROS: neg  All other systems have been reviewed and were otherwise negative with the exception of those mentioned in the HPI and as above.  Objective: Vital signs in last 24 hours:    General Appearance: Alert, cooperative, no distress, appears stated age Head: Normocephalic, without obvious abnormality, atraumatic Eyes: PERRL, conjunctiva/corneas clear, EOM's intact    Neck: Supple, symmetrical, trachea midline Back: Symmetric, no curvature, ROM normal, no CVA tenderness Lungs:  respirations unlabored Heart: Regular rate and rhythm Abdomen: Soft, non-tender Extremities: Extremities normal, atraumatic, no cyanosis or edema Pulses: 2+ and symmetric all extremities Skin: Skin color, texture, turgor normal, no rashes or lesions  NEUROLOGIC:   Mental status: Alert and oriented x4,  no aphasia, good attention span, fund of knowledge, and memory Motor Exam - grossly normal Sensory Exam - grossly normal Reflexes: 1+ Coordination - grossly normal Gait - grossly normal Balance - grossly normal Cranial Nerves: I: smell Not tested  II: visual acuity  OS: nl    OD: nl  II: visual fields Full to confrontation  II: pupils Equal, round, reactive to light  III,VII: ptosis None  III,IV,VI:  extraocular muscles  Full ROM  V: mastication Normal  V: facial light touch sensation  Normal  V,VII: corneal reflex  Present  VII: facial muscle function - upper  Normal  VII: facial muscle function - lower Normal  VIII: hearing Not tested  IX: soft palate elevation  Normal  IX,X: gag reflex Present  XI: trapezius strength  5/5  XI: sternocleidomastoid strength 5/5  XI: neck flexion strength  5/5  XII: tongue strength  Normal    Data Review Lab Results  Component Value Date   WBC 14.9 (H) 06/15/2023   HGB 15.1 (H) 06/15/2023   HCT 46.1 (H) 06/15/2023   MCV 89.5 06/15/2023   PLT 264 06/15/2023   Lab Results  Component Value Date   NA 137 06/15/2023   K 4.3 06/15/2023   CL 107 06/15/2023   CO2 24 06/15/2023   BUN 15 06/15/2023   CREATININE 0.92 06/15/2023   GLUCOSE 96 06/15/2023   Lab Results  Component Value Date   INR 1.1 06/15/2023    Assessment/Plan:  Estimated body mass index is 31.78 kg/m as calculated from the following:   Height as of 06/15/23: 5\' 8"  (1.727 m).   Weight as of 06/15/23: 94.8 kg. Patient admitted for PLIF L3-4. Patient has failed a reasonable attempt at conservative therapy.  I explained the condition and procedure to the patient and answered any questions.  Patient wishes to proceed with procedure as planned. Understands risks/ benefits and typical outcomes of procedure.   Tia Alert 06/18/2023 8:02 AM

## 2023-06-18 NOTE — Progress Notes (Signed)
Orthopedic Tech Progress Note Patient Details:  [name] 1971-03-07 [id number]  Ortho Devices Type of Ortho Device: Lumbar corsett Ortho Device/Splint Interventions: Ordered, Application, Adjustment   Post Interventions Patient Tolerated: Well Instructions Provided: Adjustment of device  Tonye Pearson 06/18/2023, 3:35 PM

## 2023-06-19 ENCOUNTER — Emergency Department (HOSPITAL_BASED_OUTPATIENT_CLINIC_OR_DEPARTMENT_OTHER)
Admission: EM | Admit: 2023-06-19 | Discharge: 2023-06-19 | Disposition: A | Discharge status: Home / Self Care | Payer: BC Managed Care – PPO | Attending: Emergency Medicine | Admitting: Emergency Medicine

## 2023-06-19 ENCOUNTER — Encounter (HOSPITAL_BASED_OUTPATIENT_CLINIC_OR_DEPARTMENT_OTHER): Payer: Self-pay

## 2023-06-19 ENCOUNTER — Other Ambulatory Visit: Payer: Self-pay

## 2023-06-19 DIAGNOSIS — X58XXXA Exposure to other specified factors, initial encounter: Secondary | ICD-10-CM | POA: Insufficient documentation

## 2023-06-19 DIAGNOSIS — T148XXA Other injury of unspecified body region, initial encounter: Secondary | ICD-10-CM | POA: Diagnosis not present

## 2023-06-19 DIAGNOSIS — Z4889 Encounter for other specified surgical aftercare: Secondary | ICD-10-CM | POA: Diagnosis present

## 2023-06-19 NOTE — Discharge Instructions (Signed)
Follow-up with Dr. Yetta Barre as planned.

## 2023-06-19 NOTE — ED Provider Notes (Signed)
[place] EMERGENCY DEPARTMENT AT [facility] Provider Note   CSN: [id number] Arrival date & time: 06/19/23  1210     History  Chief Complaint  Patient presents with   Post-op Problem    [name] is a 52 y.o. female.  Patient presents to the emergency department for evaluation of wound check.  Patient had a lumbar fusion surgery performed by Dr. Yetta Barre yesterday.  She was discharged last evening.  She reports some bleeding from the wound.  She states that she called on-call neurosurgery, Dr. Conchita Paris, was recommended that she come to the emergency department for wound recheck and dressing change.  Patient without increased pain.  No fevers.  Drainage has not been purulent.       Home Medications Prior to Admission medications   Medication Sig Start Date End Date Taking? Authorizing Provider  amphetamine-dextroamphetamine (ADDERALL) 30 MG tablet Take 30 mg by mouth 2 (two) times daily. 02/16/21   [provider]  aspirin EC 81 MG tablet Take 81 mg by mouth daily. Swallow whole.    [provider]  B Complex-C (SUPER B COMPLEX PO) Take 1 tablet by mouth daily.    [provider]  benzonatate (TESSALON) 100 MG capsule Take 100 mg by mouth 3 (three) times daily as needed for cough. 06/09/23   [provider]  Berberine Chloride (BERBERINE HCI) 500 MG CAPS Take 500 mg by mouth daily.    [provider]  Biotin 46962 MCG TABS Take 1 tablet by mouth daily.    [provider]  clonazePAM (KLONOPIN) 1 MG tablet Take 1 mg by mouth every 8 (eight) hours. 03/03/13   [provider]  DAYVIGO 10 MG TABS Take 10 mg by mouth at bedtime. 02/10/21   [provider]  Dextromethorphan-buPROPion ER (AUVELITY) 45-105 MG TBCR Take 1 tablet by mouth 2 (two) times daily.    [provider]  DHEA 10 MG TABS Take 10 mg by mouth daily.    [provider]  diclofenac (VOLTAREN) 75 MG EC tablet Take  75 mg by mouth 2 (two) times daily.    [provider]  doxepin (SINEQUAN) 50 MG capsule Take 50 mg by mouth at bedtime.    [provider]  doxycycline (VIBRA-TABS) 100 MG tablet Take 1 tablet (100 mg total) by mouth 2 (two) times daily. 05/31/23   Lenn Sink, DPM  famotidine (PEPCID) 40 MG tablet Take 40 mg by mouth daily as needed for heartburn. 03/22/18   [provider]  folic acid (FOLVITE) 800 MCG tablet Take 800 mcg by mouth at bedtime.    [provider]  Iron Combinations (IRON COMPLEX) CAPS Take 100 mg by mouth daily. 50 mg each    [provider]  levothyroxine (SYNTHROID) 112 MCG tablet Take 112 mcg by mouth daily before breakfast.    [provider]  lidocaine (LIDODERM) 5 % Place 3 patches onto the skin daily as needed (pain).  08/19/16   [provider]  liothyronine (CYTOMEL) 25 MCG tablet Take 25 mcg by mouth See admin instructions. Take with 5 mg for a total of 30 mg in the morning    [provider]  liothyronine (CYTOMEL) 5 MCG tablet Take 5 mcg by mouth See admin instructions. Take with 25 mg for a total of 30 mg in the morning    [provider]  lithium carbonate (ESKALITH) 450 MG CR tablet Take 450 mg by mouth at  bedtime.    [provider]  MAGNESIUM PO Take 560 mg by mouth daily. 280 mg each    [provider]  Melatonin 10 MG TABS Take 10 mg by mouth at bedtime.    [provider]  methocarbamol (ROBAXIN) 750 MG tablet Take 750 mg by mouth every 8 (eight) hours as needed for muscle spasms.    [provider]  Multiple Vitamin (MULTIVITAMIN WITH MINERALS) TABS tablet Take 2 tablets by mouth daily. [place]    [provider]  naloxone [facility]) nasal spray 4 mg/0.1 mL Place 1 spray into the nose as needed (OR).    [provider]  ondansetron (ZOFRAN-ODT) 8 MG disintegrating tablet Take 8 mg by mouth every 8 (eight) hours as needed for  nausea or vomiting.    [provider]  OVER THE COUNTER MEDICATION Take 2 tablets by mouth daily. [place]    [provider]  Oxycodone HCl 20 MG TABS Take 1 tablet (20 mg total) by mouth 6 (six) times daily. 06/18/23   Meyran, Tiana Loft, NP  polyethylene glycol (MIRALAX / GLYCOLAX) 17 g packet Take 17 g by mouth daily.    [provider]  pregabalin (LYRICA) 75 MG capsule Take 75 mg by mouth 4 (four) times daily.    [provider]  progesterone (PROMETRIUM) 100 MG capsule Take 100 mg by mouth at bedtime. 05/14/23   [provider]  tiZANidine (ZANAFLEX) 4 MG tablet Take 1 tablet (4 mg total) by mouth every 8 (eight) hours. 06/18/23   Meyran, Tiana Loft, NP  valACYclovir (VALTREX) 1000 MG tablet Take 2,000 mg by mouth 2 (two) times daily as needed (outbreaks).  06/13/17   [provider]  VIIBRYD 40 MG TABS Take 40 mg by mouth daily.  01/11/13   [provider]  Vitamin D-Vitamin K (VITAMIN K2-VITAMIN D3 PO) Take 1 tablet by mouth at bedtime. 5000 mg /100 mcg    [provider]  VRAYLAR 3 MG capsule Take 3 mg by mouth daily.    [provider]  ZEPBOUND 10 MG/0.5ML Pen Inject 10 mg into the skin once a week. 04/27/23   [provider]  zinc gluconate 50 MG tablet Take 50 mg by mouth daily.    [provider]      Allergies    Conjugated estrogens and Lactose intolerance (gi)    Review of Systems   Review of Systems  Physical Exam Updated Vital Signs BP 117/74   Pulse 80   Temp 98 F (36.7 C) (Oral)   Resp 18   Ht 5\' 7"  (1.702 m)   Wt 94 kg   LMP  (LMP Unknown)   SpO2 93%   BMI 32.46 kg/m  Physical Exam Vitals and nursing note reviewed.  Constitutional:      Appearance: She is well-developed.  HENT:     Head: Normocephalic and atraumatic.  Eyes:     Conjunctiva/sclera: Conjunctivae normal.  Pulmonary:     Effort: No respiratory distress.  Musculoskeletal:      Cervical back: Normal range of motion and neck supple.  Skin:    General: Skin is warm and dry.     Comments: Wound over midline lumbar spine.  Honeycomb dressing was saturated with blood.  After removal, no active bleeding noted.  Patient with Steri-Strips in place which are saturated with dried blood.  No surrounding erythema.  There is some ecchymosis as expected.  Neurological:  Mental Status: She is alert.     ED Results / Procedures / Treatments   Labs (all labs ordered are listed, but only abnormal results are displayed) Labs Reviewed - No data to display  EKG None  Radiology DG Lumbar Spine 2-3 Views Result Date: 06/18/2023 CLINICAL DATA:  [id number] Surgery, elective [id number] EXAM: LUMBAR SPINE - 2-3 VIEW COMPARISON:  Radiograph 03/23/2023 FINDINGS: Five fluoroscopic spot views of the lumbar spine obtained in the operating room. Previous L4 through S1 fusion hardware. Extension of posterior fusion to the L3 level with new L3-L4 interbody spacer. Fluoroscopy time 31.2 seconds. Dose 22.8 mGy. IMPRESSION: Intraoperative fluoroscopy during lumbar surgery. Electronically Signed   By: Narda Rutherford M.D.   On: 06/18/2023 16:50   DG C-Arm 1-60 Min-No Report Result Date: 06/18/2023 Fluoroscopy was utilized by the requesting physician.  No radiographic interpretation.   DG C-Arm 1-60 Min-No Report Result Date: 06/18/2023 Fluoroscopy was utilized by the requesting physician.  No radiographic interpretation.    Procedures Procedures    Medications Ordered in ED Medications - No data to display  ED Course/ Medical Decision Making/ A&P    Patient seen and examined. History obtained directly from patient. Work-up including labs, imaging, EKG ordered in triage, if performed, were reviewed.    Labs/EKG: None ordered  Imaging: None ordered  Medications/Fluids: None ordered  Most recent vital signs reviewed and are as follows: BP 117/74   Pulse 80   Temp 98 F (36.7 C)  (Oral)   Resp 18   Ht 5\' 7"  (1.702 m)   Wt 94 kg   LMP  (LMP Unknown)   SpO2 93%   BMI 32.46 kg/m   Initial impression: Postoperative surgical wound with some bleeding, no active bleeding  Home treatment plan: Continued home care as recommended by surgeon.  Return instructions discussed with patient: Worsening pain, swelling, purulent drainage, fever  Follow-up instructions discussed with patient: Neurosurgery as planned                                Medical Decision Making  Patient with postsurgical wound, saturated but not actively bleeding.  Does not appear to be infected.  Dressing was changed.  Patient to follow-up as previously planned.        Final Clinical Impression(s) / ED Diagnoses Final diagnoses:  Encounter for post surgical wound check  Wound drainage    Rx / DC Orders ED Discharge Orders     None         Renne Crigler, PA-C 06/19/23 1430    Virgina Norfolk, DO 06/19/23 1446

## 2023-06-19 NOTE — ED Triage Notes (Signed)
The patient had spinal fusion yesterday. She stated there is blood coming out of the dressing.

## 2023-06-19 NOTE — ED Notes (Signed)
Patients dressing does have some blood but is not draining at this time.

## 2023-06-20 ENCOUNTER — Emergency Department (HOSPITAL_COMMUNITY)
Admission: EM | Admit: 2023-06-20 | Discharge: 2023-06-20 | Disposition: A | Discharge status: Home / Self Care | Payer: BC Managed Care – PPO | Attending: Emergency Medicine | Admitting: Emergency Medicine

## 2023-06-20 DIAGNOSIS — E039 Hypothyroidism, unspecified: Secondary | ICD-10-CM | POA: Diagnosis not present

## 2023-06-20 DIAGNOSIS — Z4800 Encounter for change or removal of nonsurgical wound dressing: Secondary | ICD-10-CM | POA: Diagnosis not present

## 2023-06-20 DIAGNOSIS — Z5189 Encounter for other specified aftercare: Secondary | ICD-10-CM

## 2023-06-20 NOTE — ED Provider Notes (Signed)
[place] [facility]ERGENCY DEPARTMENT AT [facility] Provider Note   CSN: [id number] Arrival date & time: 06/20/23  7829     History Chief Complaint  Patient presents with   Wound Check    [name] is a 53 y.o. female with history of multiple back surgeries, hypothyroidism presents to the emergency department today for evaluation of postsurgical bleeding.The patient had a lumbar fusion with extension of hardware by Dr. Yetta Barre on 06/18/23 and was discharged alter that evening. Yesterday, she noticed that her post-op bandage was saturated with blood and called the oncall surgeon, Dr. Conchita Paris, who recommended coming into the ER for evaluation. When she was seen yesterday in the ER, she had no active bleeding. She had her bandage changed and recommended follow up with NS. The patient reports that she had some blood on her bandage last night, but when she woke up this morning, her pajamas and bedsheets where saturated with blood. She reports that she was trailing blood walking to the toilet at well. The husband changed her bandage and was was taken to the ER. She denies any blood thinner use or coagulopathy disorders. Denies any fevers or purulent drainage.    Wound Check       Home Medications Prior to Admission medications   Medication Sig Start Date End Date Taking? Authorizing Provider  amphetamine-dextroamphetamine (ADDERALL) 30 MG tablet Take 30 mg by mouth 2 (two) times daily. 02/16/21   [provider]  aspirin EC 81 MG tablet Take 81 mg by mouth daily. Swallow whole.    [provider]  B Complex-C (SUPER B COMPLEX PO) Take 1 tablet by mouth daily.    [provider]  benzonatate (TESSALON) 100 MG capsule Take 100 mg by mouth 3 (three) times daily as needed for cough. 06/09/23   [provider]  Berberine Chloride (BERBERINE HCI) 500 MG CAPS Take 500 mg by mouth daily.    [provider]  Biotin 56213 MCG TABS Take 1  tablet by mouth daily.    [provider]  clonazePAM (KLONOPIN) 1 MG tablet Take 1 mg by mouth every 8 (eight) hours. 03/03/13   [provider]  DAYVIGO 10 MG TABS Take 10 mg by mouth at bedtime. 02/10/21   [provider]  Dextromethorphan-buPROPion ER (AUVELITY) 45-105 MG TBCR Take 1 tablet by mouth 2 (two) times daily.    [provider]  DHEA 10 MG TABS Take 10 mg by mouth daily.    [provider]  diclofenac (VOLTAREN) 75 MG EC tablet Take 75 mg by mouth 2 (two) times daily.    [provider]  doxepin (SINEQUAN) 50 MG capsule Take 50 mg by mouth at bedtime.    [provider]  doxycycline (VIBRA-TABS) 100 MG tablet Take 1 tablet (100 mg total) by mouth 2 (two) times daily. 05/31/23   Lenn Sink, DPM  famotidine (PEPCID) 40 MG tablet Take 40 mg by mouth daily as needed for heartburn. 03/22/18   [provider]  folic acid (FOLVITE) 800 MCG tablet Take 800 mcg by mouth at bedtime.    [provider]  Iron Combinations (IRON COMPLEX) CAPS Take 100 mg by mouth daily. 50 mg each    [provider]  levothyroxine (SYNTHROID) 112 MCG tablet Take 112 mcg by mouth daily before breakfast.    [provider]  lidocaine (LIDODERM) 5 % Place 3 patches onto the skin daily as needed (pain).  08/19/16  [provider]  liothyronine (CYTOMEL) 25 MCG tablet Take 25 mcg by mouth See admin instructions. Take with 5 mg for a total of 30 mg in the morning    [provider]  liothyronine (CYTOMEL) 5 MCG tablet Take 5 mcg by mouth See admin instructions. Take with 25 mg for a total of 30 mg in the morning    [provider]  lithium carbonate (ESKALITH) 450 MG CR tablet Take 450 mg by mouth at bedtime.    [provider]  MAGNESIUM PO Take 560 mg by mouth daily. 280 mg each    [provider]  Melatonin 10 MG TABS Take 10 mg by mouth at bedtime.    [provider]  methocarbamol (ROBAXIN) 750 MG tablet Take 750 mg by mouth every 8 (eight) hours as needed for muscle spasms.    [provider]  Multiple Vitamin (MULTIVITAMIN WITH MINERALS) TABS tablet Take 2 tablets by mouth daily. [place]    [provider]  naloxone [facility]) nasal spray 4 mg/0.1 mL Place 1 spray into the nose as needed (OR).    [provider]  ondansetron (ZOFRAN-ODT) 8 MG disintegrating tablet Take 8 mg by mouth every 8 (eight) hours as needed for nausea or vomiting.    [provider]  OVER THE COUNTER MEDICATION Take 2 tablets by mouth daily. [place]    [provider]  Oxycodone HCl 20 MG TABS Take 1 tablet (20 mg total) by mouth 6 (six) times daily. 06/18/23   Meyran, Tiana Loft, NP  polyethylene glycol (MIRALAX / GLYCOLAX) 17 g packet Take 17 g by mouth daily.    [provider]  pregabalin (LYRICA) 75 MG capsule Take 75 mg by mouth 4 (four) times daily.    [provider]  progesterone (PROMETRIUM) 100 MG capsule Take 100 mg by mouth at bedtime. 05/14/23   [provider]  tiZANidine (ZANAFLEX) 4 MG tablet Take 1 tablet (4 mg total) by mouth every 8 (eight) hours. 06/18/23   Meyran, Tiana Loft, NP  valACYclovir (VALTREX) 1000 MG tablet Take 2,000 mg by mouth 2 (two) times daily as needed (outbreaks).  06/13/17   [provider]  VIIBRYD 40 MG TABS Take 40 mg by mouth daily.  01/11/13   [provider]  Vitamin D-Vitamin K (VITAMIN K2-VITAMIN D3 PO) Take 1 tablet by mouth at bedtime. 5000 mg /100 mcg    [provider]  VRAYLAR 3 MG capsule Take 3 mg by mouth daily.    [provider]  ZEPBOUND 10 MG/0.5ML Pen Inject 10 mg into the skin once a week. 04/27/23   [provider]  zinc gluconate 50 MG tablet Take 50 mg by mouth daily.    [provider]      Allergies    Conjugated estrogens and Lactose intolerance (gi)    Review of Systems    Review of Systems  Skin:  Positive for wound.    Physical Exam Updated Vital Signs BP 124/84 (BP Location: Right Arm)   Pulse 97   Temp 98.5 F (36.9 C) (Oral)   Resp 16   LMP  (LMP Unknown)   SpO2 94%  Physical Exam Vitals and nursing note reviewed.  Constitutional:      General: She is not in acute distress.    Appearance: She is not ill-appearing or toxic-appearing.  Eyes:     General: No scleral icterus. Pulmonary:     Effort: Pulmonary  effort is normal. No respiratory distress.  Musculoskeletal:     Comments: Midline incision to the lower lumbar with steri strips in place. Surround ecchymosis. She does have some surrounding induration to the surgical site. Steri strips saturated with blood and blood noted to the ABD pad that was placed. There is no active bleeding while the patient is sitting up of lying lateral decubitus.   Skin:    General: Skin is warm and dry.  Neurological:     Mental Status: She is alert.          ED Results / Procedures / Treatments   Labs (all labs ordered are listed, but only abnormal results are displayed) Labs Reviewed - No data to display  EKG None  Radiology DG Lumbar Spine 2-3 Views Result Date: 06/18/2023 CLINICAL DATA:  [id number] Surgery, elective [id number] EXAM: LUMBAR SPINE - 2-3 VIEW COMPARISON:  Radiograph 03/23/2023 FINDINGS: Five fluoroscopic spot views of the lumbar spine obtained in the operating room. Previous L4 through S1 fusion hardware. Extension of posterior fusion to the L3 level with new L3-L4 interbody spacer. Fluoroscopy time 31.2 seconds. Dose 22.8 mGy. IMPRESSION: Intraoperative fluoroscopy during lumbar surgery. Electronically Signed   By: Narda Rutherford M.D.   On: 06/18/2023 16:50   DG C-Arm 1-60 Min-No Report Result Date: 06/18/2023 Fluoroscopy was utilized by the requesting physician.  No radiographic interpretation.   DG C-Arm 1-60 Min-No Report Result Date: 06/18/2023 Fluoroscopy was utilized by the  requesting physician.  No radiographic interpretation.    Procedures Procedures   Medications Ordered in ED Medications - No data to display  ED Course/ Medical Decision Making/ A&P Clinical Course as of 06/20/23 0900  Sun Jun 20, 2023  2841 Spoke with Dr. Jake Samples with Neurosurgery who will come and evaluate the patient at bedside.  [RR]    Clinical Course User Index [RR] Achille Rich, PA-C    Medical Decision Making  52 y.o. female presents to the ER for evaluation of bleeding wound. Differential diagnosis includes but is not limited to post op bleeding, hematoma, post op complication. Vital signs unremarkable. Physical exam as noted above.   Consulted with NS and spoke with Dr. Jake Samples who will come and evaluate the patient at bedside. No recommendations for now.   Dr. Jake Samples is at bedside now. He will be stapling the patient's back and redressing the wound. She was given more supplies. The recommendation is to follow up with the clinic tomorrow. The patient is aware of the plan. Stable for discharge home with outpatient follow up tomorrow.   Portions of this report may have been transcribed using voice recognition software. Every effort was made to ensure accuracy; however, inadvertent computerized transcription errors may be present.   Final Clinical Impression(s) / ED Diagnoses Final diagnoses:  Visit for wound check    Rx / DC Orders ED Discharge Orders     None         Achille Rich, PA-C 06/20/23 3244    Gloris Manchester, MD 06/20/23 1530

## 2023-06-20 NOTE — ED Notes (Addendum)
Pt out of door, states NSU stapled her and she's ready to leave. Pt A&Ox4, ambulatory, NAD noted. PA to bedside.

## 2023-06-20 NOTE — Discharge Instructions (Addendum)
You were seen today for evaluation your wound check.  I am glad you are able to get this repaired to you.  Per the neurosurgeons recommendations, please make sure you follow-up with the office tomorrow.  Please follow wound care instructions given to you by the postop team as well.  If you have any concerns, new or worsening symptoms, please return to your nearest emerged apartment for reevaluation.  Contact a health care provider if: You received a tetanus shot and you have swelling, severe pain, redness, or bleeding at the injection site. Your pain is not controlled with medicine. You have any of these signs of infection: More redness, swelling, or pain around the wound. Fluid or blood coming from the wound. Warmth coming from the wound. A fever or chills. You are nauseous or you vomit. You are dizzy. You have a new rash or hardness around the wound. Get help right away if: You have a red streak of skin near the area around your wound. Pus or a bad smell coming from the wound. Your wound has been closed with staples, sutures, skin glue, or adhesive strips and it begins to open up and separate. Your wound is bleeding, and the bleeding does not stop with gentle pressure. These symptoms may represent a serious problem that is an emergency. Do not wait to see if the symptoms will go away. Get medical help right away. Call your local emergency services (911 in the [place] Do not drive yourself to the hospital.

## 2023-06-20 NOTE — Consult Note (Signed)
   Providing Compassionate, Quality Care - Together  Neurosurgery Consult  Referring physician: ED Reason for referral: Wound drainage  Chief Complaint: Wound drainage  History of Present Illness: This is a 52 year old female with a history of L3-4 extension decompression and fusion by Dr. Yetta Barre on 06/18/2023, that is here for her second visit due to continued wound drainage.  She went to the med [facility] yesterday for similar complaints.  She has been having bloody drainage from her incision that is saturated multiple ABD pads.  She denies any fevers, lower extremity pain or weakness or bowel or bladder changes.   Medications: I have reviewed the patient's current medications. Allergies: No Known Allergies  History reviewed. No pertinent family history. Social History:  has no history on file for tobacco use, alcohol use, and drug use.  ROS: All pertinent positives and negatives are listed HPI above  Physical Exam:  Vital signs in last 24 hours: Temp:  [98 F (36.7 C)-98.3 F (36.8 C)] 98 F (36.7 C) (07/25 1814) Pulse Rate:  [58-128] 65 (07/26 0746) Resp:  [11-18] 14 (07/26 0217) BP: (138-182)/(65-125) 153/88 (07/26 0700) SpO2:  [91 %-98 %] 96 % (07/26 0746) PE: Awake alert oriented x 3, no acute distress PERRLA Nonlabored breathing Motor strength full throughout Lumbar incision evaluated, Steri-Strips removed.  The incision is well-approximated, no erythema or purulence or fluctuance.  Upon palpation there is the middle portion of the incision that expressed dark red blood.   Impression/Assessment:  52 year old female with  Wound drainage  Plan:  -The wound was sterilely cleaned with Betadine and 3 staples were used along the middle part of the incision.  This was monitored for series of minutes and palpated and I could no longer express any blood or fluid.  Therefore was redressed.  I gave her instructions to call the office tomorrow for another wound  check. -Okay to discharge home  Thank you for allowing me to participate in this patient's care.  Please do not hesitate to call with questions or concerns.   Monia Pouch, DO Neurosurgeon [facility] [phone]

## 2023-06-20 NOTE — ED Triage Notes (Signed)
Pt reports having a spinal fusion two days ago and having bleeding from surgical site. Pt was seen yesterday for same. Pt reports that at 0600 today her dressing which was placed yesterday was saturated and required changing by husband. ABD pad in place currently with some blood noted on bandage, but not saturated at this time.

## 2023-06-28 DIAGNOSIS — H9392 Unspecified disorder of left ear: Secondary | ICD-10-CM | POA: Diagnosis not present

## 2023-06-28 DIAGNOSIS — Z4802 Encounter for removal of sutures: Secondary | ICD-10-CM | POA: Diagnosis not present

## 2023-06-28 DIAGNOSIS — Z9189 Other specified personal risk factors, not elsewhere classified: Secondary | ICD-10-CM | POA: Diagnosis not present

## 2023-06-29 DIAGNOSIS — Z6832 Body mass index (BMI) 32.0-32.9, adult: Secondary | ICD-10-CM | POA: Diagnosis not present

## 2023-06-29 DIAGNOSIS — E7800 Pure hypercholesterolemia, unspecified: Secondary | ICD-10-CM | POA: Diagnosis not present

## 2023-07-13 ENCOUNTER — Other Ambulatory Visit: Payer: BC Managed Care – PPO

## 2023-08-06 ENCOUNTER — Encounter: Payer: Self-pay | Admitting: Orthopaedic Surgery

## 2023-08-09 ENCOUNTER — Other Ambulatory Visit: Payer: Self-pay | Admitting: Orthopaedic Surgery

## 2023-08-09 DIAGNOSIS — M25511 Pain in right shoulder: Secondary | ICD-10-CM

## 2023-08-11 ENCOUNTER — Other Ambulatory Visit: Payer: Self-pay | Admitting: Orthopedic Surgery

## 2023-08-11 ENCOUNTER — Encounter: Payer: Self-pay | Admitting: Orthopedic Surgery

## 2023-08-11 DIAGNOSIS — M25511 Pain in right shoulder: Secondary | ICD-10-CM

## 2023-08-12 ENCOUNTER — Other Ambulatory Visit: Payer: Self-pay | Admitting: Family Medicine

## 2023-08-12 DIAGNOSIS — Z87891 Personal history of nicotine dependence: Secondary | ICD-10-CM

## 2023-08-18 ENCOUNTER — Other Ambulatory Visit: Payer: BC Managed Care – PPO

## 2023-08-20 ENCOUNTER — Institutional Professional Consult (permissible substitution) (INDEPENDENT_AMBULATORY_CARE_PROVIDER_SITE_OTHER): Payer: BC Managed Care – PPO

## 2023-08-24 ENCOUNTER — Other Ambulatory Visit: Payer: BC Managed Care – PPO

## 2023-08-25 ENCOUNTER — Ambulatory Visit
Admission: RE | Admit: 2023-08-25 | Discharge: 2023-08-25 | Discharge status: Home / Self Care | Payer: BC Managed Care – PPO | Source: Ambulatory Visit | Attending: Orthopedic Surgery | Admitting: Orthopedic Surgery

## 2023-08-25 ENCOUNTER — Ambulatory Visit
Admission: RE | Admit: 2023-08-25 | Discharge: 2023-08-25 | Disposition: A | Discharge status: Home / Self Care | Payer: BC Managed Care – PPO | Source: Ambulatory Visit | Attending: Orthopedic Surgery | Admitting: Orthopedic Surgery

## 2023-08-25 ENCOUNTER — Ambulatory Visit
Admission: RE | Admit: 2023-08-25 | Discharge: 2023-08-25 | Disposition: A | Discharge status: Home / Self Care | Payer: BC Managed Care – PPO | Source: Ambulatory Visit | Attending: Family Medicine | Admitting: Family Medicine

## 2023-08-25 DIAGNOSIS — M25511 Pain in right shoulder: Secondary | ICD-10-CM

## 2023-08-25 DIAGNOSIS — Z87891 Personal history of nicotine dependence: Secondary | ICD-10-CM

## 2023-08-25 MED ORDER — IOPAMIDOL (ISOVUE-M 200) INJECTION 41%
13.0000 mL | Freq: Once | INTRAMUSCULAR | Status: AC
  Administered 2023-08-25: 13 mL via INTRATHECAL

## 2023-09-28 ENCOUNTER — Other Ambulatory Visit: Payer: Self-pay | Admitting: Orthopedic Surgery

## 2023-09-30 ENCOUNTER — Institutional Professional Consult (permissible substitution) (INDEPENDENT_AMBULATORY_CARE_PROVIDER_SITE_OTHER): Payer: BC Managed Care – PPO

## 2023-10-12 NOTE — Patient Instructions (Signed)
 SURGICAL WAITING ROOM VISITATION  Patients having surgery or a procedure may have no more than 2 support people in the waiting area - these visitors may rotate.    Children under the age of 44 must have an adult with them who is not the patient.  Due to an increase in RSV and influenza rates and associated hospitalizations, children ages 46 and under may not visit patients in [facility] hospitals.  Visitors with respiratory illnesses are discouraged from visiting and should remain at home.  If the patient needs to stay at the hospital during part of their recovery, the visitor guidelines for inpatient rooms apply. Pre-op nurse will coordinate an appropriate time for 1 support person to accompany patient in pre-op.  This support person may not rotate.    Please refer to the [facility] website for the visitor guidelines for Inpatients (after your surgery is over and you are in a regular room).       Your procedure is scheduled on:  10/21/2023    Report to [facility] Main Entrance    Report to admitting at  0515 AM   Call this number if you have problems the morning of surgery [phone]   Do not eat food :After Midnight.   After Midnight you may have the following liquids until ___ 0430___ AM DAY OF SURGERY  Water Non-Citrus Juices (without pulp, NO RED-Apple, White grape, White cranberry) Black Coffee (NO MILK/CREAM OR CREAMERS, sugar ok)  Clear Tea (NO MILK/CREAM OR CREAMERS, sugar ok) regular and decaf                             Plain Jell-O (NO RED)                                           Fruit ices (not with fruit pulp, NO RED)                                     Popsicles (NO RED)                                                               Sports drinks like Gatorade (NO RED)                  The day of surgery:  Drink ONE (1) Pre-Surgery Clear Ensure or G2 at 0430 AM  ( have completed by ) the morning of surgery. Drink in one sitting. Do not sip.  This  drink was given to you during your hospital  pre-op appointment visit. Nothing else to drink after completing the  Pre-Surgery Clear Ensure or G2.          If you have questions, please contact your surgeon's office.      Oral Hygiene is also important to reduce your risk of infection.  Remember - BRUSH YOUR TEETH THE MORNING OF SURGERY WITH YOUR REGULAR TOOTHPASTE  DENTURES WILL BE REMOVED PRIOR TO SURGERY PLEASE DO NOT APPLY "Poly grip" OR ADHESIVES!!!   Do NOT smoke after Midnight   Stop all vitamins and herbal supplements 7 days before surgery.   Take these medicines the morning of surgery with A SIP OF WATER:  Clonazepam, synthroid, cytomel, lyrica , auvelicity, vraylar           Zepbound- Last dose on   DO NOT TAKE ANY ORAL DIABETIC MEDICATIONS DAY OF YOUR SURGERY  Bring CPAP mask and tubing day of surgery.                              You may not have any metal on your body including hair pins, jewelry, and body piercing             Do not wear make-up, lotions, powders, perfumes/cologne, or deodorant  Do not wear nail polish including gel and S&S, artificial/acrylic nails, or any other type of covering on natural nails including finger and toenails. If you have artificial nails, gel coating, etc. that needs to be removed by a nail salon please have this removed prior to surgery or surgery may need to be canceled/ delayed if the surgeon/ anesthesia feels like they are unable to be safely monitored.   Do not shave  48 hours prior to surgery.               Men may shave face and neck.   Do not bring valuables to the hospital. [place] IS NOT             RESPONSIBLE   FOR VALUABLES.   Contacts, glasses, dentures or bridgework may not be worn into surgery.   Bring small overnight bag day of surgery.   DO NOT BRING YOUR HOME MEDICATIONS TO THE HOSPITAL. PHARMACY WILL DISPENSE MEDICATIONS LISTED ON YOUR MEDICATION LIST TO YOU DURING  YOUR ADMISSION IN THE HOSPITAL!    Patients discharged on the day of surgery will not be allowed to drive home.  Someone NEEDS to stay with you for the first 24 hours after anesthesia.   Special Instructions: Bring a copy of your healthcare power of attorney and living will documents the day of surgery if you haven't scanned them before.              Please read over the following fact sheets you were given: IF YOU HAVE QUESTIONS ABOUT YOUR PRE-OP INSTRUCTIONS PLEASE CALL [phone]   If you received a COVID test during your pre-op visit  it is requested that you wear a mask when out in public, stay away from anyone that may not be feeling well and notify your surgeon if you develop symptoms. If you test positive for Covid or have been in contact with anyone that has tested positive in the last 10 days please notify you surgeon.      Pre-operative 5 CHG Bath Instructions   You can play a key role in reducing the risk of infection after surgery. Your skin needs to be as free of germs as possible. You can reduce the number of germs on your skin by washing with CHG (chlorhexidine gluconate) soap before surgery. CHG is an antiseptic soap that kills germs and continues to kill germs even after washing.   DO NOT use if you have an allergy to chlorhexidine/CHG or  antibacterial soaps. If your skin becomes reddened or irritated, stop using the CHG and notify one of our RNs at [phone].   Please shower with the CHG soap starting 4 days before surgery using the following schedule:     Please keep in mind the following:  DO NOT shave, including legs and underarms, starting the day of your first shower.   You may shave your face at any point before/day of surgery.  Place clean sheets on your bed the day you start using CHG soap. Use a clean washcloth (not used since being washed) for each shower. DO NOT sleep with pets once you start using the CHG.   CHG Shower Instructions:  If you choose to  wash your hair and private area, wash first with your normal shampoo/soap.  After you use shampoo/soap, rinse your hair and body thoroughly to remove shampoo/soap residue.  Turn the water OFF and apply about 3 tablespoons (45 ml) of CHG soap to a CLEAN washcloth.  Apply CHG soap ONLY FROM YOUR NECK DOWN TO YOUR TOES (washing for 3-5 minutes)  DO NOT use CHG soap on face, private areas, open wounds, or sores.  Pay special attention to the area where your surgery is being performed.  If you are having back surgery, having someone wash your back for you may be helpful. Wait 2 minutes after CHG soap is applied, then you may rinse off the CHG soap.  Pat dry with a clean towel  Put on clean clothes/pajamas   If you choose to wear lotion, please use ONLY the CHG-compatible lotions on the back of this paper.     Additional instructions for the day of surgery: DO NOT APPLY any lotions, deodorants, cologne, or perfumes.   Put on clean/comfortable clothes.  Brush your teeth.  Ask your nurse before applying any prescription medications to the skin.      CHG Compatible Lotions   Aveeno Moisturizing lotion  Cetaphil Moisturizing Cream  Cetaphil Moisturizing Lotion  Clairol Herbal Essence Moisturizing Lotion, Dry Skin  Clairol Herbal Essence Moisturizing Lotion, Extra Dry Skin  Clairol Herbal Essence Moisturizing Lotion, Normal Skin  Curel Age Defying Therapeutic Moisturizing Lotion with Alpha Hydroxy  Curel Extreme Care Body Lotion  Curel Soothing Hands Moisturizing Hand Lotion  Curel Therapeutic Moisturizing Cream, Fragrance-Free  Curel Therapeutic Moisturizing Lotion, Fragrance-Free  Curel Therapeutic Moisturizing Lotion, Original Formula  Eucerin Daily Replenishing Lotion  Eucerin Dry Skin Therapy Plus Alpha Hydroxy Crme  Eucerin Dry Skin Therapy Plus Alpha Hydroxy Lotion  Eucerin Original Crme  Eucerin Original Lotion  Eucerin Plus Crme Eucerin Plus Lotion  Eucerin TriLipid  Replenishing Lotion  Keri Anti-Bacterial Hand Lotion  Keri Deep Conditioning Original Lotion Dry Skin Formula Softly Scented  Keri Deep Conditioning Original Lotion, Fragrance Free Sensitive Skin Formula  Keri Lotion Fast Absorbing Fragrance Free Sensitive Skin Formula  Keri Lotion Fast Absorbing Softly Scented Dry Skin Formula  Keri Original Lotion  Keri Skin Renewal Lotion Keri Silky Smooth Lotion  Keri Silky Smooth Sensitive Skin Lotion  Nivea Body Creamy Conditioning Oil  Nivea Body Extra Enriched Lotion  Nivea Body Original Lotion  Nivea Body Sheer Moisturizing Lotion Nivea Crme  Nivea Skin Firming Lotion  NutraDerm 30 Skin Lotion  NutraDerm Skin Lotion  NutraDerm Therapeutic Skin Cream  NutraDerm Therapeutic Skin Lotion  ProShield Protective Hand Cream  Provon moisturizing lotion   [place]- Preparing for Total Shoulder Arthroplasty    Before surgery, you can play an important role. Because skin is  not sterile, your skin needs to be as free of germs as possible. You can reduce the number of germs on your skin by using the following products. Benzoyl Peroxide Gel Reduces the number of germs present on the skin Applied twice a day to shoulder area starting two days before surgery    ==================================================================  Please follow these instructions carefully:  BENZOYL PEROXIDE 5% GEL  Please do not use if you have an allergy to benzoyl peroxide.   If your skin becomes reddened/irritated stop using the benzoyl peroxide.  Starting two days before surgery, apply as follows: Apply benzoyl peroxide in the morning and at night. Apply after taking a shower. If you are not taking a shower clean entire shoulder front, back, and side along with the armpit with a clean wet washcloth.  Place a quarter-sized dollop on your shoulder and rub in thoroughly, making sure to cover the front, back, and side of your shoulder, along with the armpit.   2  days before ____ AM   ____ PM              1 day before ____ AM   ____ PM                         Do this twice a day for two days.  (Last application is the night before surgery, AFTER using the CHG soap as described below).  Do NOT apply benzoyl peroxide gel on the day of surgery.

## 2023-10-12 NOTE — Progress Notes (Signed)
 Anesthesia Review:  PCP: Cardiologist :  PPM/ ICD: Device Orders: Rep Notified:  Chest x-ray : CT chest- 09/09/23  EKG : Echo : Stress test: Cardiac Cath :   Activity level:  Sleep Study/ CPAP : Fasting Blood Sugar :      / Checks Blood Sugar -- times a day:    Blood Thinner/ Instructions /Last Dose: ASA / Instructions/ Last Dose :    05/2023- back surgery

## 2023-10-14 ENCOUNTER — Encounter (HOSPITAL_COMMUNITY)
Admission: RE | Admit: 2023-10-14 | Discharge: 2023-10-14 | Disposition: A | Discharge status: Home / Self Care | Source: Ambulatory Visit | Attending: Orthopedic Surgery | Admitting: Orthopedic Surgery

## 2023-10-14 ENCOUNTER — Other Ambulatory Visit: Payer: Self-pay

## 2023-10-14 ENCOUNTER — Encounter (HOSPITAL_COMMUNITY): Payer: Self-pay

## 2023-10-14 VITALS — BP 127/78 | HR 99 | Temp 98.1°F | Resp 16 | Ht 67.5 in | Wt 198.0 lb

## 2023-10-14 DIAGNOSIS — Z87891 Personal history of nicotine dependence: Secondary | ICD-10-CM | POA: Insufficient documentation

## 2023-10-14 DIAGNOSIS — G4733 Obstructive sleep apnea (adult) (pediatric): Secondary | ICD-10-CM | POA: Diagnosis not present

## 2023-10-14 DIAGNOSIS — Z9079 Acquired absence of other genital organ(s): Secondary | ICD-10-CM | POA: Insufficient documentation

## 2023-10-14 DIAGNOSIS — Z981 Arthrodesis status: Secondary | ICD-10-CM | POA: Insufficient documentation

## 2023-10-14 DIAGNOSIS — Z90710 Acquired absence of both cervix and uterus: Secondary | ICD-10-CM | POA: Diagnosis not present

## 2023-10-14 DIAGNOSIS — E890 Postprocedural hypothyroidism: Secondary | ICD-10-CM | POA: Diagnosis not present

## 2023-10-14 DIAGNOSIS — Z01818 Encounter for other preprocedural examination: Secondary | ICD-10-CM

## 2023-10-14 DIAGNOSIS — M75121 Complete rotator cuff tear or rupture of right shoulder, not specified as traumatic: Secondary | ICD-10-CM | POA: Diagnosis not present

## 2023-10-14 DIAGNOSIS — M069 Rheumatoid arthritis, unspecified: Secondary | ICD-10-CM | POA: Diagnosis not present

## 2023-10-14 DIAGNOSIS — I456 Pre-excitation syndrome: Secondary | ICD-10-CM | POA: Diagnosis not present

## 2023-10-14 DIAGNOSIS — K219 Gastro-esophageal reflux disease without esophagitis: Secondary | ICD-10-CM | POA: Diagnosis not present

## 2023-10-14 DIAGNOSIS — J439 Emphysema, unspecified: Secondary | ICD-10-CM | POA: Diagnosis not present

## 2023-10-14 DIAGNOSIS — Z96651 Presence of right artificial knee joint: Secondary | ICD-10-CM | POA: Diagnosis not present

## 2023-10-14 DIAGNOSIS — Z01812 Encounter for preprocedural laboratory examination: Secondary | ICD-10-CM | POA: Diagnosis present

## 2023-10-14 DIAGNOSIS — M797 Fibromyalgia: Secondary | ICD-10-CM | POA: Diagnosis not present

## 2023-10-14 DIAGNOSIS — Z79899 Other long term (current) drug therapy: Secondary | ICD-10-CM | POA: Diagnosis not present

## 2023-10-14 DIAGNOSIS — Z90721 Acquired absence of ovaries, unilateral: Secondary | ICD-10-CM | POA: Diagnosis not present

## 2023-10-14 DIAGNOSIS — I493 Ventricular premature depolarization: Secondary | ICD-10-CM | POA: Insufficient documentation

## 2023-10-14 LAB — CBC
HCT: 45.8 % (ref 36.0–46.0)
Hemoglobin: 15.4 g/dL — ABNORMAL HIGH (ref 12.0–15.0)
MCH: 30.6 pg (ref 26.0–34.0)
MCHC: 33.6 g/dL (ref 30.0–36.0)
MCV: 90.9 fL (ref 80.0–100.0)
Platelets: 367 10*3/uL (ref 150–400)
RBC: 5.04 MIL/uL (ref 3.87–5.11)
RDW: 12.8 % (ref 11.5–15.5)
WBC: 10.5 10*3/uL (ref 4.0–10.5)
nRBC: 0 % (ref 0.0–0.2)

## 2023-10-14 LAB — BASIC METABOLIC PANEL WITH GFR
Anion gap: 7 (ref 5–15)
BUN: 17 mg/dL (ref 6–20)
CO2: 24 mmol/L (ref 22–32)
Calcium: 9.3 mg/dL (ref 8.9–10.3)
Chloride: 104 mmol/L (ref 98–111)
Creatinine, Ser: 0.72 mg/dL (ref 0.44–1.00)
GFR, Estimated: >60 mL/min (ref >60)
Glucose, Bld: 87 mg/dL (ref 70–99)
Potassium: 4.2 mmol/L (ref 3.5–5.1)
Sodium: 135 mmol/L (ref 135–145)

## 2023-10-14 LAB — SURGICAL PCR SCREEN
MRSA, PCR: POSITIVE — AB
Staphylococcus aureus: POSITIVE — AB

## 2023-10-19 NOTE — Anesthesia Preprocedure Evaluation (Addendum)
 Anesthesia Evaluation  Patient identified by MRN, date of birth, ID band Patient awake    Reviewed: Allergy & Precautions, NPO status , Patient's Chart, lab work & pertinent test results  History of Anesthesia Complications (+) PONV and history of anesthetic complications  Airway Mallampati: III  TM Distance: >3 FB Neck ROM: Limited    Dental no notable dental hx.    Pulmonary sleep apnea and Continuous Positive Airway Pressure Ventilation , former smoker   Pulmonary exam normal        Cardiovascular negative cardio ROS  Rhythm:Regular Rate:Normal  WPW s/p ablation  Echo 10/13/22 ([facility]): SUMMARY  There is mild concentric left ventricular hypertrophy.  Left ventricular systolic function is normal.  LV ejection fraction = 55-60%.  There is no significant valvular stenosis or regurgitation.  There is no pericardial effusion.  There is no comparison study available.     Neuro/Psych   Anxiety Depression       GI/Hepatic Neg liver ROS,GERD  ,,  Endo/Other  Hypothyroidism    Renal/GU negative Renal ROS  negative genitourinary   Musculoskeletal  (+) Arthritis , Rheumatoid disorders,  Fibromyalgia -  Abdominal Normal abdominal exam  (+)   Peds  Hematology Lab Results      Component                Value               Date                      WBC                      10.5                10/14/2023                HGB                      15.4 (H)            10/14/2023                HCT                      45.8                10/14/2023                MCV                      90.9                10/14/2023                PLT                      367                 10/14/2023             Lab Results      Component                Value               Date                      NA  135                 10/14/2023                K                        4.2                 10/14/2023                 CO2                      24                  10/14/2023                GLUCOSE                  87                  10/14/2023                BUN                      17                  10/14/2023                CREATININE               0.72                10/14/2023                CALCIUM                  9.3                 10/14/2023                GFR                      85.78               12/13/2014                GFRNONAA                 >60                 10/14/2023              Anesthesia Other Findings   Reproductive/Obstetrics                             Anesthesia Physical Anesthesia Plan  ASA: 3  Anesthesia Plan: General   Post-op Pain Management: Regional block*   Induction: Intravenous  PONV Risk Score and Plan: 4 or greater and Ondansetron , Dexamethasone , Midazolam  and Treatment may vary due to age or medical condition  Airway Management Planned: Mask and Oral ETT  Additional Equipment: None  Intra-op Plan:   Post-operative Plan: Extubation in OR  Informed Consent: I have reviewed the patients History and Physical, chart, labs and discussed the procedure including the risks, benefits and alternatives for the proposed anesthesia with the patient or authorized representative who has indicated his/her understanding and acceptance.     Dental  advisory given  Plan Discussed with: CRNA  Anesthesia Plan Comments: (See PAT note from 4/17)        Anesthesia Quick Evaluation

## 2023-10-19 NOTE — Progress Notes (Addendum)
 Case: [id number] Date/Time: 10/21/23 0715   Procedure: ARTHROPLASTY, SHOULDER, TOTAL, REVERSE (Right: Shoulder)   Anesthesia type: Choice   Diagnosis: Complete tear of right rotator cuff, unspecified whether traumatic [M75.121]   Pre-op diagnosis: RIGHT SHOULDER RECURRENT ROTATOR CUFF TEAR   Location: [facility] ROOM 07 / [facility]   Surgeons: Sammye Cristal, MD       DISCUSSION: [name] is a 53 yo female who presents to PAT prior to surgery above. 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    Last cardiology visit was on 04/05/23 with Reagan Camera, FNP for follow-up WPW, frequent PVCs, palpitations. EKG showed SR. No CV symptoms. One year follow-up planned. Last echo 10/13/22 showed mild concentric LVH, LVEF 55-60%, no significant valvular disease.    LD Zepbound: 4/15  VS: BP 127/78   Pulse 99   Temp 36.7 C (Oral)   Resp 16   Ht 5' 7.5" (1.715 m)   Wt 89.8 kg   LMP  (LMP Unknown)   SpO2 95%   BMI 30.55 kg/m   PROVIDERS: Jimmey Mould, MD is PCP  Edmond Gosselin, MD is cardiologist  Stefan Edge, MD is rheumatologist  LABS: Labs reviewed: Acceptable for surgery. (all labs ordered are listed, but only abnormal results are displayed)  Labs Reviewed  SURGICAL PCR SCREEN - Abnormal; Notable for the following components:      Result Value   MRSA, PCR POSITIVE (*)    Staphylococcus aureus POSITIVE (*)    All other components within normal limits  CBC - Abnormal; Notable for the  following components:   Hemoglobin 15.4 (*)    All other components within normal limits  BASIC METABOLIC PANEL WITH GFR     IMAGES:  CT Chest 08/25/23:  IMPRESSION: 1. Lung-RADS 2, benign appearance or behavior. Continue annual screening with low-dose chest CT without contrast in 12 months. 2. Mild central bronchiectasis. 3. Faint left renal stone. 4.  Emphysema (ICD10-J43.9)     EKG: 04/05/23 ([facility]): Tracing requested. Per Narrative in Care Everywhere: Sinus rhythm  Rightward axis  Incomplete right bundle branch block  Confirmed by Wilbert Hand  [id number]  on 04-05-2023 4 09 24 PM      CV: Echo 10/13/22 ([facility]): SUMMARY  There is mild concentric left ventricular hypertrophy.  Left ventricular systolic function is normal.  LV ejection fraction = 55-60%.  There is no significant valvular stenosis or regurgitation.  There is no pericardial effusion.  There is no comparison study available.      Up to 30 days Cardiac event monitor 12/26/11 ([facility]): Baseline Rhythm  Sinus tachycardia  Rhythm Findings  1.  Ventricular  None  2.  Supraventricular  None  3.  Bradyarrhythmias and Pauses  None, the lowest heart rate recorded was 80 bpm at 2 25 PM on 29 June.  The baseline heart rate was 105 bpm sinus tachycardia there was only 1 stable events that occurred which was automatically detected and this was sinus tachycardia.  Symptoms reported  Not reported  CONCLUSIONS  1.  slightly abnormal event monitor due to sustained heart rates for most of the monitoring.  2.  arrhythmia  Not present  3.  Symptoms were not reported  4.  Symptoms were not correlated with arrhythmias      Stress echo 11/28/18 ([facility]): Conclusions Summary Normal Stress Echocardiogram with no ischemia suggested. LVEF >50%. Functional capacity is below average for age/sex -5 mets on the 2-min Bruce Protocol  Past Medical History:  Diagnosis Date   ADD (attention deficit disorder)    Anxiety     Breast mass 02/2014   right   Cervical pseudoarthrosis (HCC)    Chronic back pain    Chronic constipation    Complication of anesthesia    after 07/01/2017 cervical c5-t2, pt states she had hallucination x 2 wks after surgery   Depression    Fibromyalgia    GERD (gastroesophageal reflux disease)    H/O cardiac radiofrequency ablation 1996   History of kidney stones    one lodged in right kidney - 3mm    HSV infection    Hyperthyroidism    at on time - now hypothyroidism   Hypothyroidism    Neuromuscular disorder (HCC)    sciatica    PONV (postoperative nausea and vomiting)    Rheumatoid arthritis (HCC)    OA- lumbar & cervical     Sleep apnea    CPAP used nightly   Wolff-Parkinson-White (WPW) syndrome    Wolff-Parkinson-White pattern 1994   had ablation surgery    Past Surgical History:  Procedure Laterality Date   ABDOMINAL HYSTERECTOMY     BACK SURGERY     x 5 lumbar, 3 cervical   BUNIONECTOMY Bilateral    x4 (2 on each foot)   CARDIAC ELECTROPHYSIOLOGY MAPPING AND ABLATION     WPW   CERVICAL SPINE SURGERY     fusions x 3   COLONOSCOPY  04/06/2013   CYSTOSCOPY N/A 04/06/2018   Procedure: CYSTOSCOPY;  Surgeon: Rogene Claude, MD;  Location: [facility];  Service: Gynecology;  Laterality: N/A;   ESOPHAGOGASTRODUODENOSCOPY  04/06/2013   LAPAROSCOPY  04/06/2018   Procedure: LAPAROSCOPY DIAGNOSTIC;  Surgeon: Rogene Claude, MD;  Location: [facility];  Service: Gynecology;;   LUMBAR LAMINECTOMY/DECOMPRESSION MICRODISCECTOMY Right 09/02/2016   Procedure: RIGHT LUMBAR FIVE-SACRAL ONE REDO MICRODISCECTOMY;  Surgeon: Isadora Mar, MD;  Location: [facility] OR;  Service: Neurosurgery;  Laterality: Right;   LUMBAR SPINE SURGERY     x 4   POSTERIOR CERVICAL FUSION/FORAMINOTOMY N/A 07/01/2017   Procedure: Posterior Cervical Fusion with lateral mass fixation Cervical Six-Seven;  Surgeon: Isadora Mar, MD;  Location: [facility] OR;  Service: Neurosurgery;  Laterality: N/A;  Posterior Cervical  Fusion with lateral mass fixation Cervical Six-Seven   ROTATOR CUFF REPAIR Right 03/24/2023   SIGMOIDOSCOPY     SUPRACERVICAL ABDOMINAL HYSTERECTOMY N/A 04/06/2018   Procedure: SUPRACERVICAL HYSTERECTOMY ABDOMINAL WITH LEFT OOPHORECTOMY AND BILATERAL SALPINGECTOMY;  Surgeon: Rogene Claude, MD;  Location: [facility];  Service: Gynecology;  Laterality: N/A;   THYROIDECTOMY  1996   TONSILLECTOMY     TONSILLECTOMY     TOTAL KNEE ARTHROPLASTY Right 02/28/2021   Procedure: TOTAL KNEE ARTHROPLASTY, cortisone injection left knee;  Surgeon: Neil Balls, MD;  Location: [facility];  Service: Orthopedics;  Laterality: Right;   WISDOM TOOTH EXTRACTION      MEDICATIONS:  amphetamine -dextroamphetamine  (ADDERALL) 30 MG tablet   aspirin  EC 81 MG tablet   B Complex-C (SUPER B COMPLEX PO)   Berberine Chloride (BERBERINE HCI) 500 MG CAPS   Biotin 10000 MCG TABS   cetirizine (ZYRTEC ALLERGY) 10 MG tablet   clonazePAM  (  KLONOPIN ) 1 MG tablet   DAYVIGO  10 MG TABS   Dextromethorphan -buPROPion  ER (AUVELITY ) 45-105 MG TBCR   DHEA 10 MG TABS   diclofenac  (VOLTAREN ) 75 MG EC tablet   doxepin  (SINEQUAN ) 50 MG capsule   doxycycline  (VIBRA -TABS) 100 MG tablet   famotidine  (PEPCID ) 40 MG tablet   fexofenadine (ALLEGRA ALLERGY) 180 MG tablet   folic acid  (FOLVITE ) 800 MCG tablet   Iron Combinations (IRON COMPLEX) CAPS   levothyroxine  (SYNTHROID ) 100 MCG tablet   lidocaine  (LIDODERM ) 5 %   liothyronine  (CYTOMEL ) 25 MCG tablet   liothyronine  (CYTOMEL ) 5 MCG tablet   lithium  carbonate (ESKALITH ) 450 MG CR tablet   MAGNESIUM  PO   Melatonin 10 MG TABS   methocarbamol  (ROBAXIN ) 750 MG tablet   Multiple Vitamin (MULTIVITAMIN WITH MINERALS) TABS tablet   naloxone  (NARCAN ) nasal spray 4 mg/0.1 mL   ondansetron  (ZOFRAN -ODT) 8 MG disintegrating tablet   OVER THE COUNTER MEDICATION   Oxycodone  HCl 20 MG TABS   polyethylene glycol (MIRALAX  / GLYCOLAX ) 17 g packet   pregabalin  (LYRICA ) 75 MG capsule   progesterone   (PROMETRIUM ) 100 MG capsule   spironolactone (ALDACTONE) 50 MG tablet   tirzepatide (ZEPBOUND) 12.5 MG/0.5ML Pen   tiZANidine  (ZANAFLEX ) 4 MG tablet   valACYclovir  (VALTREX ) 1000 MG tablet   VIIBRYD  40 MG TABS   Vitamin D-Vitamin K (VITAMIN K2-VITAMIN D3 PO)   VRAYLAR  3 MG capsule   zinc  gluconate 50 MG tablet   No current facility-administered medications for this encounter.   Antoinette Kirschner [facility] Short Stay/Anesthesiology [facility] Phone [phone] 10/19/2023 9:12 AM

## 2023-10-21 ENCOUNTER — Ambulatory Visit (HOSPITAL_COMMUNITY): Payer: Self-pay | Admitting: Anesthesiology

## 2023-10-21 ENCOUNTER — Ambulatory Visit (HOSPITAL_COMMUNITY)
Admission: RE | Admit: 2023-10-21 | Discharge: 2023-10-21 | Disposition: A | Discharge status: Home / Self Care | Attending: Orthopedic Surgery | Admitting: Orthopedic Surgery

## 2023-10-21 ENCOUNTER — Encounter (HOSPITAL_COMMUNITY): Payer: Self-pay | Admitting: Orthopedic Surgery

## 2023-10-21 ENCOUNTER — Other Ambulatory Visit: Payer: Self-pay

## 2023-10-21 ENCOUNTER — Encounter (HOSPITAL_COMMUNITY)
Admission: RE | Disposition: A | Discharge status: Home / Self Care | Payer: Self-pay | Source: Home / Self Care | Attending: Orthopedic Surgery

## 2023-10-21 DIAGNOSIS — G4730 Sleep apnea, unspecified: Secondary | ICD-10-CM | POA: Diagnosis not present

## 2023-10-21 DIAGNOSIS — K219 Gastro-esophageal reflux disease without esophagitis: Secondary | ICD-10-CM | POA: Diagnosis not present

## 2023-10-21 DIAGNOSIS — M75101 Unspecified rotator cuff tear or rupture of right shoulder, not specified as traumatic: Secondary | ICD-10-CM | POA: Diagnosis not present

## 2023-10-21 DIAGNOSIS — M797 Fibromyalgia: Secondary | ICD-10-CM | POA: Diagnosis not present

## 2023-10-21 DIAGNOSIS — M75121 Complete rotator cuff tear or rupture of right shoulder, not specified as traumatic: Secondary | ICD-10-CM | POA: Diagnosis present

## 2023-10-21 DIAGNOSIS — Z01818 Encounter for other preprocedural examination: Secondary | ICD-10-CM

## 2023-10-21 DIAGNOSIS — M069 Rheumatoid arthritis, unspecified: Secondary | ICD-10-CM | POA: Diagnosis not present

## 2023-10-21 DIAGNOSIS — Z87891 Personal history of nicotine dependence: Secondary | ICD-10-CM | POA: Insufficient documentation

## 2023-10-21 HISTORY — PX: REVERSE SHOULDER ARTHROPLASTY: SHX5054

## 2023-10-21 SURGERY — ARTHROPLASTY, SHOULDER, TOTAL, REVERSE
Anesthesia: General | Site: Shoulder | Laterality: Right

## 2023-10-21 MED ORDER — EPHEDRINE SULFATE (PRESSORS) 50 MG/ML IJ SOLN
INTRAMUSCULAR | Status: DC | PRN
  Administered 2023-10-21 (×2): 5 mg via INTRAVENOUS
  Administered 2023-10-21: 10 mg via INTRAVENOUS
  Administered 2023-10-21: 5 mg via INTRAVENOUS
  Administered 2023-10-21: 10 mg via INTRAVENOUS

## 2023-10-21 MED ORDER — LACTATED RINGERS IV SOLN: INTRAVENOUS | Status: DC

## 2023-10-21 MED ORDER — SUGAMMADEX SODIUM 200 MG/2ML IV SOLN
INTRAVENOUS | Status: DC | PRN
  Administered 2023-10-21: 200 mg via INTRAVENOUS

## 2023-10-21 MED ORDER — LIDOCAINE HCL (PF) 2 % IJ SOLN
INTRAMUSCULAR | Status: AC
  Filled 2023-10-21: qty 5

## 2023-10-21 MED ORDER — DEXAMETHASONE SODIUM PHOSPHATE 10 MG/ML IJ SOLN
INTRAMUSCULAR | Status: DC | PRN
  Administered 2023-10-21: 10 mg via INTRAVENOUS

## 2023-10-21 MED ORDER — ROCURONIUM BROMIDE 10 MG/ML (PF) SYRINGE
PREFILLED_SYRINGE | INTRAVENOUS | Status: AC
  Filled 2023-10-21: qty 10

## 2023-10-21 MED ORDER — PHENYLEPHRINE HCL-NACL 20-0.9 MG/250ML-% IV SOLN
INTRAVENOUS | Status: DC | PRN
  Administered 2023-10-21 (×2): 160 ug via INTRAVENOUS
  Administered 2023-10-21: 25 ug/min via INTRAVENOUS

## 2023-10-21 MED ORDER — ROCURONIUM BROMIDE 100 MG/10ML IV SOLN
INTRAVENOUS | Status: DC | PRN
  Administered 2023-10-21: 60 mg via INTRAVENOUS

## 2023-10-21 MED ORDER — SODIUM CHLORIDE 0.9 % IR SOLN
Status: DC | PRN
  Administered 2023-10-21: 1000 mL

## 2023-10-21 MED ORDER — METHOCARBAMOL 500 MG PO TABS: 750.0000 mg | ORAL_TABLET | Freq: Four times a day (QID) | ORAL | Status: DC | PRN

## 2023-10-21 MED ORDER — CHLORHEXIDINE GLUCONATE 0.12 % MT SOLN
15.0000 mL | Freq: Once | OROMUCOSAL | Status: AC
  Administered 2023-10-21: 15 mL via OROMUCOSAL

## 2023-10-21 MED ORDER — CHLORHEXIDINE GLUCONATE 4 % EX SOLN
1.0000 | CUTANEOUS | 1 refills | Status: AC
Start: 2023-10-21 — End: ?

## 2023-10-21 MED ORDER — PROPOFOL 10 MG/ML IV BOLUS
INTRAVENOUS | Status: AC
  Filled 2023-10-21: qty 20

## 2023-10-21 MED ORDER — ACETAMINOPHEN 10 MG/ML IV SOLN: 1000.0000 mg | Freq: Once | INTRAVENOUS | Status: DC | PRN

## 2023-10-21 MED ORDER — FENTANYL CITRATE PF 50 MCG/ML IJ SOSY: 25.0000 ug | PREFILLED_SYRINGE | INTRAMUSCULAR | Status: DC | PRN

## 2023-10-21 MED ORDER — ONDANSETRON HCL 4 MG/2ML IJ SOLN
INTRAMUSCULAR | Status: DC | PRN
  Administered 2023-10-21: 4 mg via INTRAVENOUS

## 2023-10-21 MED ORDER — TRANEXAMIC ACID-NACL 1000-0.7 MG/100ML-% IV SOLN
1000.0000 mg | INTRAVENOUS | Status: AC
  Administered 2023-10-21: 1000 mg via INTRAVENOUS
  Filled 2023-10-21: qty 100

## 2023-10-21 MED ORDER — ONDANSETRON HCL 4 MG/2ML IJ SOLN
INTRAMUSCULAR | Status: AC
  Filled 2023-10-21: qty 2

## 2023-10-21 MED ORDER — MIDAZOLAM HCL 5 MG/5ML IJ SOLN
INTRAMUSCULAR | Status: DC | PRN
  Administered 2023-10-21: 2 mg via INTRAVENOUS

## 2023-10-21 MED ORDER — PROPOFOL 500 MG/50ML IV EMUL
INTRAVENOUS | Status: AC
  Filled 2023-10-21: qty 50

## 2023-10-21 MED ORDER — MIDAZOLAM HCL 2 MG/2ML IJ SOLN
INTRAMUSCULAR | Status: AC
  Filled 2023-10-21: qty 2

## 2023-10-21 MED ORDER — ORAL CARE MOUTH RINSE: 15.0000 mL | Freq: Once | OROMUCOSAL | Status: AC

## 2023-10-21 MED ORDER — LIDOCAINE HCL (CARDIAC) PF 100 MG/5ML IV SOSY
PREFILLED_SYRINGE | INTRAVENOUS | Status: DC | PRN
  Administered 2023-10-21: 20 mg via INTRAVENOUS

## 2023-10-21 MED ORDER — CEFAZOLIN SODIUM-DEXTROSE 2-4 GM/100ML-% IV SOLN
2.0000 g | INTRAVENOUS | Status: AC
  Administered 2023-10-21: 2 g via INTRAVENOUS
  Filled 2023-10-21: qty 100

## 2023-10-21 MED ORDER — CARMEX CLASSIC LIP BALM EX OINT
TOPICAL_OINTMENT | CUTANEOUS | Status: AC
  Filled 2023-10-21: qty 10

## 2023-10-21 MED ORDER — EPHEDRINE 5 MG/ML INJ
INTRAVENOUS | Status: AC
  Filled 2023-10-21: qty 5

## 2023-10-21 MED ORDER — METHOCARBAMOL 1000 MG/10ML IJ SOLN
500.0000 mg | Freq: Four times a day (QID) | INTRAMUSCULAR | Status: DC | PRN
Start: 2023-10-21 — End: 2023-10-21

## 2023-10-21 MED ORDER — VANCOMYCIN HCL IN DEXTROSE 1-5 GM/200ML-% IV SOLN
1000.0000 mg | INTRAVENOUS | Status: AC
  Administered 2023-10-21: 1000 mg via INTRAVENOUS
  Filled 2023-10-21: qty 200

## 2023-10-21 MED ORDER — DEXAMETHASONE SODIUM PHOSPHATE 10 MG/ML IJ SOLN
INTRAMUSCULAR | Status: AC
  Filled 2023-10-21: qty 1

## 2023-10-21 MED ORDER — WATER FOR IRRIGATION, STERILE IR SOLN
Status: DC | PRN
  Administered 2023-10-21: 1000 mL

## 2023-10-21 MED ORDER — PROPOFOL 10 MG/ML IV BOLUS
INTRAVENOUS | Status: DC | PRN
  Administered 2023-10-21: 25 ug/kg/min via INTRAVENOUS
  Administered 2023-10-21: 200 mg via INTRAVENOUS

## 2023-10-21 MED ORDER — PHENYLEPHRINE HCL-NACL 20-0.9 MG/250ML-% IV SOLN
INTRAVENOUS | Status: AC
  Filled 2023-10-21: qty 500

## 2023-10-21 MED ORDER — MUPIROCIN 2 % EX OINT: 1.0000 | TOPICAL_OINTMENT | Freq: Two times a day (BID) | CUTANEOUS | 0 refills | Status: AC

## 2023-10-21 MED ORDER — FENTANYL CITRATE (PF) 100 MCG/2ML IJ SOLN
INTRAMUSCULAR | Status: DC | PRN
  Administered 2023-10-21: 100 ug via INTRAVENOUS

## 2023-10-21 MED ORDER — FENTANYL CITRATE (PF) 100 MCG/2ML IJ SOLN
INTRAMUSCULAR | Status: AC
  Filled 2023-10-21: qty 2

## 2023-10-21 MED ORDER — PHENYLEPHRINE 80 MCG/ML (10ML) SYRINGE FOR IV PUSH (FOR BLOOD PRESSURE SUPPORT)
PREFILLED_SYRINGE | INTRAVENOUS | Status: AC
  Filled 2023-10-21: qty 10

## 2023-10-21 MED ORDER — BUPIVACAINE HCL (PF) 0.5 % IJ SOLN
INTRAMUSCULAR | Status: DC | PRN
  Administered 2023-10-21: 10 mL via PERINEURAL

## 2023-10-21 MED ORDER — BUPIVACAINE LIPOSOME 1.3 % IJ SUSP
INTRAMUSCULAR | Status: DC | PRN
Start: 2023-10-21 — End: 2023-10-21
  Administered 2023-10-21: 10 mL via PERINEURAL

## 2023-10-21 SURGICAL SUPPLY — 70 items
BAG COUNTER SPONGE SURGICOUNT (BAG) IMPLANT
BAG ZIPLOCK 12X15 (MISCELLANEOUS) ×1 IMPLANT
BASEPLATE P2 COATD GLND 6.5X30 (Shoulder) IMPLANT
BIT DRILL 1.6MX128 (BIT) IMPLANT
BIT DRILL 2.5 DIA 127 CALI (BIT) IMPLANT
BLADE SAW SGTL 73X25 THK (BLADE) ×1 IMPLANT
BOOTIES KNEE HIGH SLOAN (MISCELLANEOUS) ×2 IMPLANT
COOLER ICEMAN CLASSIC (MISCELLANEOUS) ×1 IMPLANT
COVER BACK TABLE 60X90IN (DRAPES) ×1 IMPLANT
COVER SURGICAL LIGHT HANDLE (MISCELLANEOUS) ×1 IMPLANT
DRAPE INCISE IOBAN 66X45 STRL (DRAPES) ×1 IMPLANT
DRAPE POUCH INSTRU U-SHP 10X18 (DRAPES) ×1 IMPLANT
DRAPE SHEET LG 3/4 BI-LAMINATE (DRAPES) ×1 IMPLANT
DRAPE SURG 17X11 SM STRL (DRAPES) ×1 IMPLANT
DRAPE SURG ORHT 6 SPLT 77X108 (DRAPES) ×2 IMPLANT
DRAPE TOP 10253 STERILE (DRAPES) ×1 IMPLANT
DRAPE U-SHAPE 47X51 STRL (DRAPES) ×1 IMPLANT
DRILL GLEN ALTIVATE 3.5 (DRILL) IMPLANT
DRSG AQUACEL AG ADV 3.5X 6 (GAUZE/BANDAGES/DRESSINGS) ×1 IMPLANT
DRSG AQUACEL AG ADV 3.5X10 (GAUZE/BANDAGES/DRESSINGS) IMPLANT
DURAPREP 26ML APPLICATOR (WOUND CARE) ×2 IMPLANT
ELECT BLADE TIP CTD 4 INCH (ELECTRODE) ×1 IMPLANT
ELECT PENCIL ROCKER SW 15FT (MISCELLANEOUS) ×1 IMPLANT
ELECT REM PT RETURN 15FT ADLT (MISCELLANEOUS) ×1 IMPLANT
FACESHIELD WRAPAROUND (MASK) ×1 IMPLANT
FACESHIELD WRAPAROUND OR TEAM (MASK) ×1 IMPLANT
GLOVE BIO SURGEON STRL SZ7.5 (GLOVE) ×1 IMPLANT
GLOVE BIOGEL PI IND STRL 6.5 (GLOVE) ×1 IMPLANT
GLOVE BIOGEL PI IND STRL 8 (GLOVE) ×1 IMPLANT
GLOVE SURG SS PI 6.5 STRL IVOR (GLOVE) ×1 IMPLANT
GOWN STRL REUS W/ TWL LRG LVL3 (GOWN DISPOSABLE) ×1 IMPLANT
GOWN STRL REUS W/ TWL XL LVL3 (GOWN DISPOSABLE) ×1 IMPLANT
HOOD PEEL AWAY T7 (MISCELLANEOUS) ×3 IMPLANT
INSERT SMALL SOCKET 32MM NEU (Insert) IMPLANT
KIT BASIN OR (CUSTOM PROCEDURE TRAY) ×1 IMPLANT
KIT TURNOVER KIT A (KITS) ×1 IMPLANT
MANIFOLD NEPTUNE II (INSTRUMENTS) ×1 IMPLANT
NDL TROCAR POINT SZ 2 1/2 (NEEDLE) IMPLANT
NEEDLE TROCAR POINT SZ 2 1/2 (NEEDLE) IMPLANT
NS IRRIG 1000ML POUR BTL (IV SOLUTION) ×1 IMPLANT
PACK SHOULDER (CUSTOM PROCEDURE TRAY) ×1 IMPLANT
PAD COLD SHLDR WRAP-ON (PAD) ×1 IMPLANT
RESTRAINT HEAD UNIVERSAL NS (MISCELLANEOUS) ×1 IMPLANT
RETRIEVER SUT HEWSON (MISCELLANEOUS) IMPLANT
SCREW BONE LOCKING RSP 5.0X14 (Screw) ×1 IMPLANT
SCREW BONE LOCKING RSP 5.0X30 (Screw) ×1 IMPLANT
SCREW BONE RSP LOCK 5X14 (Screw) IMPLANT
SCREW BONE RSP LOCK 5X18 (Screw) IMPLANT
SCREW BONE RSP LOCK 5X26 (Screw) IMPLANT
SCREW BONE RSP LOCK 5X30 (Screw) IMPLANT
SCREW BONE RSP LOCKING 18MM LG (Screw) ×1 IMPLANT
SCREW BONE RSP LOCKING 5.0X26 (Screw) ×1 IMPLANT
SCREW RETAIN W/HEAD 4MM OFFSET (Shoulder) IMPLANT
SET HNDPC FAN SPRY TIP SCT (DISPOSABLE) ×1 IMPLANT
SLING ARM FOAM STRAP LRG (SOFTGOODS) IMPLANT
SLING ARM FOAM STRAP MED (SOFTGOODS) IMPLANT
STEM HUMERAL SM SHELL SHOU 10 (Miscellaneous) IMPLANT
STRIP CLOSURE SKIN 1/2X4 (GAUZE/BANDAGES/DRESSINGS) ×1 IMPLANT
SUCTION TUBE FRAZIER 10FR DISP (SUCTIONS) IMPLANT
SUPPORT WRAP ARM LG (MISCELLANEOUS) ×1 IMPLANT
SUT ETHIBOND 2 V 37 (SUTURE) IMPLANT
SUT MNCRL AB 4-0 PS2 18 (SUTURE) ×1 IMPLANT
SUT VIC AB 2-0 CT1 TAPERPNT 27 (SUTURE) ×2 IMPLANT
SUTURE FIBERWR#2 38 REV NDL BL (SUTURE) IMPLANT
TAP SURG THRD DJ 6.5 (ORTHOPEDIC DISPOSABLE SUPPLIES) IMPLANT
TAPE LABRALWHITE 1.5X36 (TAPE) IMPLANT
TAPE SUT LABRALTAP WHT/BLK (SUTURE) IMPLANT
TOWEL OR 17X26 10 PK STRL BLUE (TOWEL DISPOSABLE) ×1 IMPLANT
TUBE SUCTION HIGH CAP CLEAR NV (SUCTIONS) ×1 IMPLANT
WATER STERILE IRR 1000ML POUR (IV SOLUTION) ×1 IMPLANT

## 2023-10-21 NOTE — Transfer of Care (Signed)
 Immediate Anesthesia Transfer of Care Note  Patient: [name]  Procedure(s) Performed: ARTHROPLASTY, SHOULDER, TOTAL, REVERSE (Right: Shoulder)  Patient Location: PACU  Anesthesia Type:GA combined with regional for post-op pain  Level of Consciousness: awake and alert   Airway & Oxygen Therapy: Patient Spontanous Breathing and Patient connected to nasal cannula oxygen  Post-op Assessment: Report given to RN and Post -op Vital signs reviewed and stable  Post vital signs: Reviewed and stable  Last Vitals:  Vitals Value Taken Time  BP 91/73 10/21/23 0916  Temp    Pulse 101 10/21/23 0918  Resp 15 10/21/23 0918  SpO2 97 % 10/21/23 0918  Vitals shown include unfiled device data.  Last Pain:  Vitals:   10/21/23 1610  TempSrc:   PainSc: 7       Patients Stated Pain Goal: 4 (10/21/23 9604)  Complications: No notable events documented.

## 2023-10-21 NOTE — Anesthesia Procedure Notes (Signed)
 Procedure Name: Intubation Date/Time: 10/21/2023 7:48 AM  Performed by: Bing Buff, RNPre-anesthesia Checklist: Patient identified, Emergency Drugs available, Suction available and Patient being monitored Patient Re-evaluated:Patient Re-evaluated prior to induction Oxygen Delivery Method: Circle system utilized Preoxygenation: Pre-oxygenation with 100% oxygen Induction Type: IV induction Ventilation: Mask ventilation without difficulty Laryngoscope Size: Glidescope and 3 Grade View: Grade I Tube type: Oral Tube size: 7.0 mm Number of attempts: 1 Airway Equipment and Method: Stylet and Oral airway Placement Confirmation: ETT inserted through vocal cords under direct vision, positive ETCO2 and breath sounds checked- equal and bilateral Secured at: 23 cm Tube secured with: Tape Dental Injury: Teeth and Oropharynx as per pre-operative assessment  Difficulty Due To: Difficult Airway- due to reduced neck mobility

## 2023-10-21 NOTE — Discharge Instructions (Addendum)
 Discharge Instructions after Reverse Total Shoulder Arthroplasty   A sling has been provided for you. You are to wear this at all times (except for bathing and dressing), until your first post operative visit with Dr. Deeann Fare. Please also wear while sleeping at night. While you bath and dress, let the arm/elbow extend straight down to stretch your elbow. Wiggle your fingers and pump your first while your in the sling to prevent hand swelling. Use ice on the shoulder intermittently over the first 48 hours after surgery. Continue to use ice or and ice machine as needed after 48 hours for pain control/swelling.  Given the amount of baseline pain meds that you are prescribed, please reach out to pain mgmt provider if further medication is needed as we do not feel safe managing stronger pain meds.  Use your medicine liberally over the first 48 hours, and then you can begin to taper your use. You may take Extra Strength Tylenol  or Tylenol  only in place of the pain pills. DO NOT take ANY nonsteroidal anti-inflammatory pain medications: Advil , Motrin , Ibuprofen , Aleve, Naproxen or Naprosyn.  Resume aspirin  the day after surgery Leave your dressing on until your first follow up visit.  You may shower with the dressing.  Hold your arm as if you still have your sling on while you shower. Simply allow the water  to wash over the site and then pat dry. Make sure your axilla (armpit) is completely dry after showering.    Please call [phone] during normal business hours or [phone] after hours for any problems. Including the following:  - excessive redness of the incisions - drainage for more than 4 days - fever of more than 101.5 F  *Please note that pain medications will not be refilled after hours or on weekends.    Dental Antibiotics:  In most cases prophylactic antibiotics for Dental procdeures after total joint surgery are not necessary.  Exceptions are as follows:  1. History of prior  total joint infection  2. Severely immunocompromised (Organ Transplant, cancer chemotherapy, Rheumatoid biologic meds such as Humera)  3. Poorly controlled diabetes (A1C &gt; 8.0, blood glucose over 200)  If you have one of these conditions, contact your surgeon for an antibiotic prescription, prior to your dental procedure.

## 2023-10-21 NOTE — Evaluation (Signed)
 Occupational Therapy Evaluation Patient Details Name: [name] MRN: [id number] DOB: 1970/07/25 Today's Date: 10/21/2023   History of Present Illness   [name] is a 53 y.o. s/p R Reverse TSA on 10/21/23  PMH:RA, fibromyalgia, depression, chronic back pain, anxiety, ADD     Clinical Impressions PTA pt lives with husband and was independent prior to this am surgery.  Education completed regarding compensatory strategies for ADL tasks and functional mobility, management of sling, R ROM per specified parameters in the order set as indicated below, positioning of operative arm in sitting and supine and edema control, including use of "Iceman" Cold Therapy machine. Caregiver present for education, written handouts provided and reviewed using Teach Back and pt/caregiver verbalized/demonstrated understanding. Due to the below listed deficits, pt requires minimal assistance with ADL tasks and modified indep assist with basic functional mobility. Caregiver will be able to provide necessary level of assistance at discharge. Pt to follow up with MD to progress rehab of the operative shoulder.      If plan is discharge home, recommend the following:   A little help with walking and/or transfers;A little help with bathing/dressing/bathroom;Assistance with cooking/housework;Assist for transportation;Help with stairs or ramp for entrance     Functional Status Assessment   Patient has had a recent decline in their functional status and demonstrates the ability to make significant improvements in function in a reasonable and predictable amount of time.     Equipment Recommendations   None recommended by OT      Precautions/Restrictions   Precautions Precautions: Shoulder No shoulder ROM allowed; elbow/wrist/hand AROM only. Recall of Precautions/Restrictions: Intact Required Braces or Orthoses: Sling Restrictions Weight Bearing Restrictions Per Provider Order: Yes RUE  Weight Bearing Per Provider Order: Non weight bearing     Mobility Bed Mobility Overal bed mobility:  (patient in recliner)             General bed mobility comments: educated on bed positioning    Transfers Overall transfer level: Independent                 General transfer comment: no issues with basic mobility observed      Balance Overall balance assessment: No apparent balance deficits (not formally assessed)                                         ADL either performed or assessed with clinical judgement   ADL Overall ADL's : Needs assistance/impaired    Per orders, R shoulder parameters as follows for ADL tasks: No shoulder ROM; elbow/wrist/hand ROM only. While moving within specified parameters, pt/caregiver instructed on bathing and how to donn/doff shirt, placing operative arm through sleeve first when donning and off last when doffing.Pt/caregiver educated on compensatory strategies for LB ADL and strategies to reduce risk of falls.  Pt/caregiver educated on donning/doffing sling and to wear the sling at all times with the exception of ADL, and to loosen the neck strap of the sling when the operative arm is in a supported position when sitting. In sitting or supine, pt instructed to have a pillow behind and under their operative arm to provide support. If assist needed with ambulation, caregiver educated on the importance of walking on pt's non-operative side.  Education regarding use of "IceMan" Cold Therapy completed, including the importance of using a barrier on the shoulder prior to positioning the wrap-on pad. Pt/caregiver  verbalized/demonstrated understanding. Teach Back used while caregiver assisted with dressing pt and positioning "wrap-on pad" to facilitate DC.                                          Vision Baseline Vision/History: 0 No visual deficits Vision Assessment?: No apparent visual deficits     Perception  Perception: Within Functional Limits       Praxis Praxis: WFL       Pertinent Vitals/Pain Pain Assessment Pain Assessment: No/denies pain     Extremity/Trunk Assessment Upper Extremity Assessment Upper Extremity Assessment: Right hand dominant;RUE deficits/detail RUE Deficits / Details: R UE surgical n block still active, R AAROM elbow, wrist, hand WFL's   Lower Extremity Assessment Lower Extremity Assessment: Overall WFL for tasks assessed   Cervical / Trunk Assessment Cervical / Trunk Assessment: Normal   Communication Communication Communication: No apparent difficulties   Cognition Arousal: Alert Behavior During Therapy: WFL for tasks assessed/performed Cognition: No apparent impairments                               Following commands: Intact       Cueing  General Comments   Cueing Techniques: Verbal cues  R shoulder post op dressing intact   Exercises Exercises: Shoulder Shoulder Exercises Elbow Flexion: 10 reps, Self ROM Elbow Extension: 10 reps, Self ROM Wrist Flexion: 10 reps, Self ROM Wrist Extension: Self ROM, 10 reps Digit Composite Flexion: Self ROM, 10 reps Composite Extension: Self ROM, 10 reps   Shoulder Instructions  Handout provided as stated above     Home Living Family/patient expects to be discharged to:: Private residence Living Arrangements: Spouse/significant other Available Help at Discharge: Family;Available 24 hours/day Type of Home: House Home Access: Stairs to enter [employer] of Steps: 2 Entrance Stairs-Rails: Left Home Layout: One level     Bathroom Shower/Tub: IT trainer: Standard Bathroom Accessibility: Yes How Accessible: Accessible via walker Home Equipment: Rolling Walker (2 wheels);Hand held shower head;Adaptive equipment;BSC/3in1 Adaptive Equipment: Reacher Additional Comments: massage recliner      Prior Functioning/Environment Prior Level of  Function : Independent/Modified Independent                     AM-PAC OT "6 Clicks" Daily Activity     Outcome Measure Help from another person eating meals?: None Help from another person taking care of personal grooming?: None Help from another person toileting, which includes using toliet, bedpan, or urinal?: A Little Help from another person bathing (including washing, rinsing, drying)?: A Little Help from another person to put on and taking off regular upper body clothing?: A Little Help from another person to put on and taking off regular lower body clothing?: A Little 6 Click Score: 20   End of Session Equipment Utilized During Treatment: Gait belt Nurse Communication: Precautions;Weight bearing status  Activity Tolerance: Patient tolerated treatment well Patient left: in chair;with call bell/phone within reach;with family/visitor present                   Time: 1030-1120 OT Time Calculation (min): 50 min Charges:  OT General Charges $OT Visit: 1 Visit OT Evaluation $OT Eval Low Complexity: 1 Low OT Treatments $Self Care/Home Management : 8-22 mins $Therapeutic Exercise: 8-22 mins Blue Winther OT/L Acute Rehabilitation Department  [phone]  [phone]  10/21/2023, 11:35 AM

## 2023-10-21 NOTE — H&P (Signed)
 [name] is an 53 y.o. female.   Chief Complaint: R shoulder pain and dysfunction HPI: s/p prior RCR with failure of repair and some arthritic changes in joint.  Wished to proceed with reverse TSA to decrease pain and restore function.  Past Medical History:  Diagnosis Date   ADD (attention deficit disorder)    Anxiety    Breast mass 02/2014   right   Cervical pseudoarthrosis (HCC)    Chronic back pain    Chronic constipation    Complication of anesthesia    after 07/01/2017 cervical c5-t2, pt states she had hallucination x 2 wks after surgery   Depression    Fibromyalgia    GERD (gastroesophageal reflux disease)    H/O cardiac radiofrequency ablation 1996   History of kidney stones    one lodged in right kidney - 3mm    HSV infection    Hyperthyroidism    at on time - now hypothyroidism   Hypothyroidism    Neuromuscular disorder (HCC)    sciatica    PONV (postoperative nausea and vomiting)    Rheumatoid arthritis (HCC)    OA- lumbar & cervical     Sleep apnea    CPAP used nightly   Wolff-Parkinson-White (WPW) syndrome    Wolff-Parkinson-White pattern 1994   had ablation surgery    Past Surgical History:  Procedure Laterality Date   ABDOMINAL HYSTERECTOMY     BACK SURGERY     x 5 lumbar, 3 cervical   BUNIONECTOMY Bilateral    x4 (2 on each foot)   CARDIAC ELECTROPHYSIOLOGY MAPPING AND ABLATION     WPW   CERVICAL SPINE SURGERY     fusions x 3   COLONOSCOPY  04/06/2013   CYSTOSCOPY N/A 04/06/2018   Procedure: CYSTOSCOPY;  Surgeon: Rogene Claude, MD;  Location: [facility];  Service: Gynecology;  Laterality: N/A;   ESOPHAGOGASTRODUODENOSCOPY  04/06/2013   LAPAROSCOPY  04/06/2018   Procedure: LAPAROSCOPY DIAGNOSTIC;  Surgeon: Rogene Claude, MD;  Location: [facility];  Service: Gynecology;;   LUMBAR LAMINECTOMY/DECOMPRESSION MICRODISCECTOMY Right 09/02/2016   Procedure: RIGHT LUMBAR FIVE-SACRAL ONE REDO MICRODISCECTOMY;  Surgeon: Isadora Mar, MD;   Location: [facility] OR;  Service: Neurosurgery;  Laterality: Right;   LUMBAR SPINE SURGERY     x 4   POSTERIOR CERVICAL FUSION/FORAMINOTOMY N/A 07/01/2017   Procedure: Posterior Cervical Fusion with lateral mass fixation Cervical Six-Seven;  Surgeon: Isadora Mar, MD;  Location: [facility] OR;  Service: Neurosurgery;  Laterality: N/A;  Posterior Cervical Fusion with lateral mass fixation Cervical Six-Seven   ROTATOR CUFF REPAIR Right 03/24/2023   SIGMOIDOSCOPY     SUPRACERVICAL ABDOMINAL HYSTERECTOMY N/A 04/06/2018   Procedure: SUPRACERVICAL HYSTERECTOMY ABDOMINAL WITH LEFT OOPHORECTOMY AND BILATERAL SALPINGECTOMY;  Surgeon: Rogene Claude, MD;  Location: [facility];  Service: Gynecology;  Laterality: N/A;   THYROIDECTOMY  1996   TONSILLECTOMY     TONSILLECTOMY     TOTAL KNEE ARTHROPLASTY Right 02/28/2021   Procedure: TOTAL KNEE ARTHROPLASTY, cortisone injection left knee;  Surgeon: Neil Balls, MD;  Location: [facility];  Service: Orthopedics;  Laterality: Right;   WISDOM TOOTH EXTRACTION      Family History  Problem Relation Age of Onset   Colon polyps Mother    Mitral valve prolapse Mother    High Cholesterol Mother    Cardiomyopathy Brother    Social History:  reports that she quit smoking about 7 years ago. Her smoking use included cigarettes. She started smoking about 28 years ago. She has  a 10.5 pack-year smoking history. She has never been exposed to tobacco smoke. She has never used smokeless tobacco. She reports that she does not drink alcohol  and does not use drugs.  Allergies:  Allergies  Allergen Reactions   Conjugated Estrogens Other (See Comments)    Estradiol  patch interacted with antidepressants   Lactose Intolerance (Gi) Diarrhea    GI Distress    Medications Prior to Admission  Medication Sig Dispense Refill   amphetamine -dextroamphetamine  (ADDERALL) 30 MG tablet Take 30 mg by mouth 2 (two) times daily.     B Complex-C (SUPER B COMPLEX PO) Take 1 tablet by mouth daily.      Berberine Chloride (BERBERINE HCI) 500 MG CAPS Take 500 mg by mouth daily.     Biotin 16109 MCG TABS Take 1 tablet by mouth daily.     cetirizine (ZYRTEC ALLERGY) 10 MG tablet Take 10 mg by mouth at bedtime.     clonazePAM  (KLONOPIN ) 1 MG tablet Take 1 mg by mouth 3 (three) times daily as needed for anxiety.     DAYVIGO  10 MG TABS Take 10 mg by mouth at bedtime.     Dextromethorphan -buPROPion  ER (AUVELITY ) 45-105 MG TBCR Take 1 tablet by mouth 2 (two) times daily.     DHEA 10 MG TABS Take 10 mg by mouth daily.     diclofenac  (VOLTAREN ) 75 MG EC tablet Take 75 mg by mouth 2 (two) times daily.     doxepin  (SINEQUAN ) 50 MG capsule Take 50 mg by mouth at bedtime.     fexofenadine (ALLEGRA ALLERGY) 180 MG tablet Take 180 mg by mouth in the morning.     folic acid  (FOLVITE ) 800 MCG tablet Take 800 mcg by mouth at bedtime.     Iron Combinations (IRON COMPLEX) CAPS Take 100 mg by mouth daily.     levothyroxine  (SYNTHROID ) 100 MCG tablet Take 100 mcg by mouth daily before breakfast.     lidocaine  (LIDODERM ) 5 % Place 3 patches onto the skin daily as needed (pain).      liothyronine  (CYTOMEL ) 25 MCG tablet Take 25 mcg by mouth See admin instructions. Take with 5 mg for a total of 30 mg in the morning     liothyronine  (CYTOMEL ) 5 MCG tablet Take 5 mcg by mouth See admin instructions. Take with 25 mg for a total of 30 mg in the morning     lithium  carbonate (ESKALITH ) 450 MG CR tablet Take 450 mg by mouth at bedtime.     MAGNESIUM  PO Take 560 mg by mouth daily. 280 mg each     Melatonin 10 MG TABS Take 10 mg by mouth at bedtime.     methocarbamol  (ROBAXIN ) 750 MG tablet Take 750 mg by mouth every 8 (eight) hours as needed for muscle spasms.     Multiple Vitamin (MULTIVITAMIN WITH MINERALS) TABS tablet Take 2 tablets by mouth daily. [place]     ondansetron  (ZOFRAN -ODT) 8 MG disintegrating tablet Take 8 mg by mouth every 8 (eight) hours as needed for nausea or vomiting.     OVER THE COUNTER MEDICATION  Take 2 tablets by mouth daily. [place]     Oxycodone  HCl 20 MG TABS Take 1 tablet (20 mg total) by mouth 6 (six) times daily. 40 tablet 0   polyethylene glycol (MIRALAX  / GLYCOLAX ) 17 g packet Take 17 g by mouth daily.     pregabalin  (LYRICA ) 75 MG capsule Take 75 mg by mouth 4 (four) times daily.  progesterone  (PROMETRIUM ) 100 MG capsule Take 100 mg by mouth at bedtime.     spironolactone (ALDACTONE) 50 MG tablet Take 50 mg by mouth daily.     tirzepatide (ZEPBOUND) 12.5 MG/0.5ML Pen Inject 12.5 mg into the skin once a week.     tiZANidine  (ZANAFLEX ) 4 MG tablet Take 1 tablet (4 mg total) by mouth every 8 (eight) hours. 30 tablet 0   VIIBRYD  40 MG TABS Take 40 mg by mouth daily.      Vitamin D-Vitamin K (VITAMIN K2-VITAMIN D3 PO) Take 1 tablet by mouth at bedtime. 5000 mg /100 mcg     VRAYLAR  3 MG capsule Take 3 mg by mouth daily.     zinc  gluconate 50 MG tablet Take 50 mg by mouth daily.     aspirin  EC 81 MG tablet Take 81 mg by mouth daily. Swallow whole.     doxycycline  (VIBRA -TABS) 100 MG tablet Take 1 tablet (100 mg total) by mouth 2 (two) times daily. (Patient not taking: Reported on 10/13/2023) 20 tablet 0   famotidine  (PEPCID ) 40 MG tablet Take 40 mg by mouth daily as needed for heartburn.     naloxone  (NARCAN ) nasal spray 4 mg/0.1 mL Place 1 spray into the nose as needed (OR).     valACYclovir  (VALTREX ) 1000 MG tablet Take 2,000 mg by mouth 2 (two) times daily as needed (outbreaks).   1    No results found for this or any previous visit (from the past 48 hours). No results found.  Review of Systems  All other systems reviewed and are negative.   Blood pressure 123/74, pulse 80, temperature 98.3 F (36.8 C), temperature source Oral, resp. rate 16, height 5' 7.5" (1.715 m), weight 89.8 kg, SpO2 93%. Physical Exam HENT:     Head: Atraumatic.  Eyes:     Extraocular Movements: Extraocular movements intact.  Cardiovascular:     Pulses: Normal pulses.  Pulmonary:     Effort:  Pulmonary effort is normal.  Musculoskeletal:     Comments: R shoulder pain with RC testing. NVID  Neurological:     Mental Status: She is alert.      Assessment/Plan s/p prior RCR with failure of repair and some arthritic changes in joint.  Wished to proceed with reverse TSA to decrease pain and restore function. Risks / benefits of surgery discussed Consent on chart  NPO for OR Preop antibiotics   Audrene Blessing, MD 10/21/2023, 7:18 AM

## 2023-10-21 NOTE — Op Note (Signed)
 Procedure(s): ARTHROPLASTY, SHOULDER, TOTAL, REVERSE Procedure Note  [name] female 53 y.o. 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 female  With recurrent right shoulder rotator cuff tear with early arthropathy.  Pain and dysfunction interfere with sleep and quality of life.  Patient wished to proceed with reverse total shoulder arthroplasty to decrease pain and improve function.     Surgeon: Audrene Blessing   Assistants: Sidra Dredge PA-C Amber was present and scrubbed throughout the procedure and was essential in positioning, retraction, exposure, and closure)  Anesthesia: General endotracheal anesthesia with preoperative interscalene block given by the attending anesthesiologist    Procedure Detail  ARTHROPLASTY, SHOULDER, TOTAL, REVERSE   Estimated Blood Loss:  200 mL         Drains: none  Blood Given: none          Specimens: none        Complications:  * No complications entered in OR log *         Disposition: PACU - hemodynamically stable.         Condition: stable      OPERATIVE FINDINGS:  A DJO Altivate pressfit reverse total shoulder arthroplasty was placed with a  size 10 stem, a 32-4 glenosphere, and a standard-mm poly insert. The base plate  fixation was excellent.  PROCEDURE: The patient was identified in the preoperative holding area  where I personally marked the operative site after verifying site, side,  and procedure with the patient. An interscalene block given by  the attending anesthesiologist in the holding area and the patient was taken back to the operating room where all extremities were  carefully padded in position after general anesthesia was induced. She  was placed in a beach-chair position and the  operative upper extremity was  prepped and draped in a standard sterile fashion. An approximately 10-  cm incision was made from the tip of the coracoid process to the center  point of the humerus at the level of the axilla. Dissection was carried  down through subcutaneous tissues to the level of the cephalic vein  which was taken laterally with the deltoid. The pectoralis major was  retracted medially. The subdeltoid space was developed and the lateral  edge of the conjoined tendon was identified. The undersurface of  conjoined tendon was palpated and the musculocutaneous nerve was not in  the field. Retractor was placed underneath the conjoined and second  retractor was placed lateral into the deltoid. The circumflex humeral  artery and vessels were identified and clamped and coagulated. The  biceps tendon was absent.  The subscapularis was contracted and taken down as a peel.  The  joint was then gently externally rotated while the capsule was released  from the humeral neck around to just beyond the 6 o'clock position. At  this point, the joint was dislocated and the humeral head was presented  into the wound.  Multiple sutures were removed. The cutting guide was used to make the appropriate  head cut and the head was saved for potentially bone grafting.  The glenoid was exposed with the arm in an  abducted extended position. The anterior and posterior labrum were  completely excised and the capsule was released circumferentially to  allow for exposure of the glenoid for preparation. The 2.5 mm  drill was  placed using the guide in 5-10 inferior angulation and the tap was then advanced in the same hole. Small and large reamers were then used. The tap was then removed and the Metaglene was then screwed in with excellent purchase.  The peripheral guide was then used to drilled measured and filled peripheral locking screws. The size 32-4 glenosphere was then impacted on the [facility] taper and the  central screw was placed. The humerus was then again exposed and the diaphyseal reamers were used followed by the metaphyseal reamers. The final broach was left in place in the proximal trial was placed. The joint was reduced and with this implant it was felt that soft tissue tensioning was appropriate with excellent stability and excellent range of motion. Therefore, final humeral stem was placed press-fit.  And then the trial polyethylene inserts were tested again and the above implant was felt to be the most appropriate for final insertion. The joint was reduced taken through full range of motion and felt to be stable. Soft tissue tension was appropriate.  The joint was then copiously irrigated with pulse  lavage and the wound was then closed. The subscapularis could not be repaired.  Skin was closed with 2-0 Vicryl in a deep dermal layer and 4-0  Monocryl for skin closure. Steri-Strips were applied. Sterile  dressings were then applied as well as a sling. The patient was allowed  to awaken from general anesthesia, transferred to stretcher, and taken  to recovery room in stable condition.   POSTOPERATIVE PLAN: The patient will be observed in the recovery room and if her pain is well-controlled with regional anesthesia and she is hemodynamically stable she can be discharged home today with family.

## 2023-10-21 NOTE — Anesthesia Postprocedure Evaluation (Signed)
 Anesthesia Post Note  Patient: [name]  Procedure(s) Performed: ARTHROPLASTY, SHOULDER, TOTAL, REVERSE (Right: Shoulder)     Patient location during evaluation: PACU Anesthesia Type: General Level of consciousness: awake and alert Pain management: pain level controlled Vital Signs Assessment: post-procedure vital signs reviewed and stable Respiratory status: spontaneous breathing, nonlabored ventilation, respiratory function stable and patient connected to nasal cannula oxygen Cardiovascular status: blood pressure returned to baseline and stable Postop Assessment: no apparent nausea or vomiting Anesthetic complications: no   No notable events documented.  Last Vitals:  Vitals:   10/21/23 1100 10/21/23 1145  BP: 117/83 127/83  Pulse: 98 97  Resp:    Temp:    SpO2: 91% 93%    Last Pain:  Vitals:   10/21/23 1145  TempSrc:   PainSc: 0-No pain                 Theotis Flake P Monesha Monreal

## 2023-10-21 NOTE — Anesthesia Procedure Notes (Signed)
 Anesthesia Regional Block: Interscalene brachial plexus block   Pre-Anesthetic Checklist: , timeout performed,  Correct Patient, Correct Site, Correct Laterality,  Correct Procedure, Correct Position, site marked,  Risks and benefits discussed,  Surgical consent,  Pre-op evaluation,  At surgeon's request and post-op pain management  Laterality: Right  Prep: Dura Prep       Needles:  Injection technique: Single-shot  Needle Type: Echogenic Stimulator Needle     Needle Length: 5cm  Needle Gauge: 20     Additional Needles:   Procedures:,,,, ultrasound used (permanent image in chart),,    Narrative:  Start time: 10/21/2023 7:20 AM End time: 10/21/2023 7:25 AM Injection made incrementally with aspirations every 5 mL.  Performed by: Personally  Anesthesiologist: Micheal Agent, DO  Additional Notes: Patient identified. Risks/Benefits/Options discussed with patient including but not limited to bleeding, infection, nerve damage, failed block, incomplete pain control. Patient expressed understanding and wished to proceed. All questions were answered. Sterile technique was used throughout the entire procedure. Please see nursing notes for vital signs. Aspirated in 5cc intervals with injection for negative confirmation. Patient was given instructions on fall risk and not to get out of bed. All questions and concerns addressed with instructions to call with any issues or inadequate analgesia.

## 2023-10-22 ENCOUNTER — Encounter (HOSPITAL_COMMUNITY): Payer: Self-pay | Admitting: Orthopedic Surgery

## 2024-03-01 ENCOUNTER — Other Ambulatory Visit: Payer: Self-pay | Admitting: Family Medicine

## 2024-03-01 DIAGNOSIS — R109 Unspecified abdominal pain: Secondary | ICD-10-CM

## 2024-04-05 ENCOUNTER — Other Ambulatory Visit: Payer: Self-pay | Admitting: Gastroenterology

## 2024-04-05 DIAGNOSIS — R1310 Dysphagia, unspecified: Secondary | ICD-10-CM

## 2024-05-03 ENCOUNTER — Encounter: Payer: Self-pay | Admitting: Orthopedic Surgery

## 2024-05-03 DIAGNOSIS — G8929 Other chronic pain: Secondary | ICD-10-CM

## 2024-05-04 ENCOUNTER — Other Ambulatory Visit: Payer: Self-pay | Admitting: Orthopedic Surgery

## 2024-05-04 ENCOUNTER — Other Ambulatory Visit: Payer: Self-pay | Admitting: Neurological Surgery

## 2024-05-04 ENCOUNTER — Encounter: Payer: Self-pay | Admitting: Neurological Surgery

## 2024-05-04 DIAGNOSIS — M5416 Radiculopathy, lumbar region: Secondary | ICD-10-CM

## 2024-05-04 DIAGNOSIS — M25511 Pain in right shoulder: Secondary | ICD-10-CM

## 2024-05-04 DIAGNOSIS — M5414 Radiculopathy, thoracic region: Secondary | ICD-10-CM

## 2024-05-05 ENCOUNTER — Ambulatory Visit
Admission: RE | Admit: 2024-05-05 | Discharge: 2024-05-05 | Disposition: A | Discharge status: Home / Self Care | Source: Ambulatory Visit | Attending: Orthopedic Surgery | Admitting: Orthopedic Surgery

## 2024-05-05 DIAGNOSIS — M25511 Pain in right shoulder: Secondary | ICD-10-CM

## 2024-05-11 ENCOUNTER — Inpatient Hospital Stay: Admission: RE | Admit: 2024-05-11 | Source: Ambulatory Visit

## 2024-05-24 ENCOUNTER — Ambulatory Visit
Admission: RE | Admit: 2024-05-24 | Discharge: 2024-05-24 | Disposition: A | Discharge status: Home / Self Care | Source: Ambulatory Visit | Attending: Neurological Surgery | Admitting: Neurological Surgery

## 2024-05-24 DIAGNOSIS — M5416 Radiculopathy, lumbar region: Secondary | ICD-10-CM

## 2024-05-24 DIAGNOSIS — M5414 Radiculopathy, thoracic region: Secondary | ICD-10-CM

## 2024-06-12 ENCOUNTER — Other Ambulatory Visit: Payer: Self-pay | Admitting: Neurological Surgery

## 2024-06-12 ENCOUNTER — Encounter: Payer: Self-pay | Admitting: Neurological Surgery

## 2024-06-12 DIAGNOSIS — M5416 Radiculopathy, lumbar region: Secondary | ICD-10-CM

## 2024-06-16 ENCOUNTER — Inpatient Hospital Stay
Admission: RE | Admit: 2024-06-16 | Discharge: 2024-06-16 | Attending: Neurological Surgery | Admitting: Neurological Surgery

## 2024-06-16 DIAGNOSIS — M5416 Radiculopathy, lumbar region: Secondary | ICD-10-CM

## 2024-07-13 ENCOUNTER — Inpatient Hospital Stay: Admission: RE | Admit: 2024-07-13 | Source: Ambulatory Visit

## 2024-08-24 ENCOUNTER — Other Ambulatory Visit
